# Patient Record
Sex: Female | Born: 1970
Health system: Southern US, Community
[De-identification: ages and names within clinical notes are randomized; demographics above are authoritative.]

## PROBLEM LIST (undated history)

## (undated) DIAGNOSIS — G8929 Other chronic pain: Secondary | ICD-10-CM

## (undated) DIAGNOSIS — F32A Depression, unspecified: Secondary | ICD-10-CM

## (undated) DIAGNOSIS — K5792 Diverticulitis of intestine, part unspecified, without perforation or abscess without bleeding: Secondary | ICD-10-CM

## (undated) DIAGNOSIS — F419 Anxiety disorder, unspecified: Secondary | ICD-10-CM

## (undated) DIAGNOSIS — G4700 Insomnia, unspecified: Secondary | ICD-10-CM

## (undated) DIAGNOSIS — M7918 Myalgia, other site: Secondary | ICD-10-CM

## (undated) DIAGNOSIS — G43909 Migraine, unspecified, not intractable, without status migrainosus: Secondary | ICD-10-CM

## (undated) DIAGNOSIS — F329 Major depressive disorder, single episode, unspecified: Secondary | ICD-10-CM

## (undated) DIAGNOSIS — M549 Dorsalgia, unspecified: Secondary | ICD-10-CM

## (undated) DIAGNOSIS — E785 Hyperlipidemia, unspecified: Secondary | ICD-10-CM

## (undated) HISTORY — DX: Major depressive disorder, single episode, unspecified: F32.9

## (undated) HISTORY — DX: Hyperlipidemia, unspecified: E78.5

## (undated) HISTORY — DX: Myalgia, other site: M79.18

## (undated) HISTORY — PX: CERVICAL SPINE SURGERY: SHX589

## (undated) HISTORY — DX: Migraine, unspecified, not intractable, without status migrainosus: G43.909

## (undated) HISTORY — DX: Dorsalgia, unspecified: M54.9

## (undated) HISTORY — PX: TUBAL LIGATION: SHX77

## (undated) HISTORY — DX: Insomnia, unspecified: G47.00

## (undated) HISTORY — DX: Other chronic pain: G89.29

## (undated) HISTORY — DX: Anxiety disorder, unspecified: F41.9

## (undated) HISTORY — DX: Depression, unspecified: F32.A

---

## 1998-04-28 ENCOUNTER — Emergency Department (HOSPITAL_COMMUNITY): Admission: EM | Admit: 1998-04-28 | Discharge: 1998-04-28 | Payer: Self-pay | Admitting: Emergency Medicine

## 2002-07-17 ENCOUNTER — Encounter: Payer: Self-pay | Admitting: Family Medicine

## 2002-07-17 ENCOUNTER — Ambulatory Visit (HOSPITAL_COMMUNITY): Admission: RE | Admit: 2002-07-17 | Discharge: 2002-07-17 | Payer: Self-pay | Admitting: Family Medicine

## 2003-08-08 ENCOUNTER — Encounter: Payer: Self-pay | Admitting: Family Medicine

## 2003-08-08 ENCOUNTER — Ambulatory Visit (HOSPITAL_COMMUNITY): Admission: RE | Admit: 2003-08-08 | Discharge: 2003-08-08 | Payer: Self-pay | Admitting: Family Medicine

## 2004-02-17 ENCOUNTER — Ambulatory Visit (HOSPITAL_COMMUNITY): Admission: RE | Admit: 2004-02-17 | Discharge: 2004-02-17 | Payer: Self-pay | Admitting: Family Medicine

## 2004-06-14 ENCOUNTER — Ambulatory Visit (HOSPITAL_COMMUNITY): Admission: RE | Admit: 2004-06-14 | Discharge: 2004-06-15 | Payer: Self-pay | Admitting: Neurological Surgery

## 2008-06-13 ENCOUNTER — Ambulatory Visit (HOSPITAL_COMMUNITY): Admission: RE | Admit: 2008-06-13 | Discharge: 2008-06-13 | Payer: Self-pay | Admitting: Family Medicine

## 2008-09-30 ENCOUNTER — Ambulatory Visit: Payer: Self-pay | Admitting: General Practice

## 2010-03-03 ENCOUNTER — Ambulatory Visit (HOSPITAL_COMMUNITY): Admission: RE | Admit: 2010-03-03 | Discharge: 2010-03-03 | Payer: Self-pay | Admitting: Family Medicine

## 2010-03-31 ENCOUNTER — Encounter
Admission: RE | Admit: 2010-03-31 | Discharge: 2010-03-31 | Payer: Self-pay | Admitting: Physical Medicine & Rehabilitation

## 2010-04-02 ENCOUNTER — Ambulatory Visit: Payer: Self-pay | Admitting: Physical Medicine & Rehabilitation

## 2011-01-13 ENCOUNTER — Emergency Department (HOSPITAL_COMMUNITY)
Admission: EM | Admit: 2011-01-13 | Discharge: 2011-01-14 | Disposition: A | Discharge status: Home / Self Care | Payer: Self-pay | Attending: Emergency Medicine | Admitting: Emergency Medicine

## 2011-01-13 ENCOUNTER — Emergency Department (HOSPITAL_COMMUNITY): Payer: Self-pay

## 2011-01-13 DIAGNOSIS — R110 Nausea: Secondary | ICD-10-CM | POA: Insufficient documentation

## 2011-01-13 DIAGNOSIS — W19XXXA Unspecified fall, initial encounter: Secondary | ICD-10-CM | POA: Insufficient documentation

## 2011-01-13 DIAGNOSIS — R51 Headache: Secondary | ICD-10-CM | POA: Insufficient documentation

## 2011-01-13 DIAGNOSIS — M542 Cervicalgia: Secondary | ICD-10-CM | POA: Insufficient documentation

## 2011-01-13 DIAGNOSIS — R55 Syncope and collapse: Secondary | ICD-10-CM | POA: Insufficient documentation

## 2011-01-13 DIAGNOSIS — S0990XA Unspecified injury of head, initial encounter: Secondary | ICD-10-CM | POA: Insufficient documentation

## 2011-01-13 LAB — BASIC METABOLIC PANEL
BUN: 5 mg/dL — ABNORMAL LOW (ref 6–23)
CO2: 22 mEq/L (ref 19–32)
Calcium: 7.8 mg/dL — ABNORMAL LOW (ref 8.4–10.5)
Creatinine, Ser: 0.96 mg/dL (ref 0.4–1.2)
Glucose, Bld: 125 mg/dL — ABNORMAL HIGH (ref 70–99)

## 2011-01-13 LAB — CBC
HCT: 33.5 % — ABNORMAL LOW (ref 36.0–46.0)
MCHC: 35.2 g/dL (ref 30.0–36.0)
MCV: 90.3 fL (ref 78.0–100.0)
Platelets: 273 10*3/uL (ref 150–400)
RDW: 13.2 % (ref 11.5–15.5)

## 2011-01-13 LAB — DIFFERENTIAL
Eosinophils Absolute: 0.1 10*3/uL (ref 0.0–0.7)
Eosinophils Relative: 1 % (ref 0–5)
Lymphocytes Relative: 54 % — ABNORMAL HIGH (ref 12–46)
Lymphs Abs: 4 10*3/uL (ref 0.7–4.0)
Monocytes Absolute: 0.5 10*3/uL (ref 0.1–1.0)

## 2011-01-13 LAB — URINALYSIS, ROUTINE W REFLEX MICROSCOPIC
Bilirubin Urine: NEGATIVE
Hgb urine dipstick: NEGATIVE
Ketones, ur: NEGATIVE mg/dL
Protein, ur: NEGATIVE mg/dL
Urobilinogen, UA: 0.2 mg/dL (ref 0.0–1.0)

## 2011-04-22 NOTE — Op Note (Signed)
NAME:  Barbara Yates, Barbara Yates                        ACCOUNT NO.:  [id number]   MEDICAL RECORD NO.:  [id number]                   PATIENT TYPE:  OIB   LOCATION:  3012                                 FACILITY:  [facility]   PHYSICIAN:  Stefani Dama, M.D.               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   OPERATION PERFORMED:  Anterior cervical decompression, arthrodesis with  structural allograft, Alphatek fixation.   SURGEON:  Stefani Dama, M.D.   FIRST ASSISTANT:  Hilda Lias, M.D.   ANESTHESIA:  General endotracheal.   INDICATIONS FOR PROCEDURE:  The patient is a 40 year old individual who has  had significant symptoms of cervical spondylitic myelopathy with  radiculopathy.  She was found to have a large centrally herniated disk at  C5-C6 level causing cord compression.  After careful consideration of her  options, she was advised regarding surgical decompression and stabilization  of this joint.   DESCRIPTION OF PROCEDURE:  The patient was brought to the operating room and  placed on the table in supine position.  After smooth induction of general  endotracheal anesthesia, she was placed in five pounds of halter traction  and the neck was prepped with DuraPrep and draped in sterile fashion.  A  transverse incision was made in the slightest skin crease in the midportion  of the patient's neck and this was carried down to the platysma.  The plane  between the sternocleidomastoid muscle and the strap muscles was dissected  bluntly until the prevertebral space was reached.  The first identifiable  disk space was noted to be that of C6-7 on a localizing radiograph.  Dissection was  carried cephalad slightly so as to expose C5 and C6.  Self-  retaining Caspar retractor was placed  in the wound.  The longus colli  muscle was stripped back and the disk space was then entered using a 15  blade and a combination of curets and rongeurs was used to evacuate the disk  space of a significant quantity of degenerative disk material.  As the  region of the posterior longitudinal ligament was reached, there was noted  to be several rents with fragments of disk protruding through them.  These  openings were enlarged and the disk was removed from the free epidural space  thus decompressing the ventral spinal canal.  Dissection was carried out to  the left and then off to the right side to decompress these areas each.  The  posterior edges of the osteophytes in the depth of the disk space were then  drilled down with high speed air drill and a 2.3 mm dissecting tool.  Hemostasis was  carefully maintained in the epidural space and around the  nerve roots and also in the soft tissues.  Once the disk was completely  evacuated and the nerve roots were well decompressed, a 7 mm Alphatek graft  was placed into the wound.  This was packed with as much demineralized bone  matrix as could be fitted into the center of the graft. The graft was  countersunk slightly and ventral osteophytes were then trimmed and an 18 mm  standard size Alphatek plate was affixed with four locking 4 x 14 mm screws.  Final localizing radiograph confirmed good position of the plate and screws.  Care was then taken to  make sure that hemostasis was adequately achieved in the prevertebral space  and then the platysma was closed with 3-0 Vicryl in interrupted fashion.  3-  0 Vicryl was used subcuticularly and Dermabond was placed on the skin. The  patient tolerated the procedure well and was returned to the recovery room  in stable condition.                                               Stefani Dama, M.D.     Merla Riches  D:  06/14/2004  T:  06/14/2004  Job:  [id number]

## 2013-04-11 DIAGNOSIS — Z0289 Encounter for other administrative examinations: Secondary | ICD-10-CM

## 2013-05-06 ENCOUNTER — Other Ambulatory Visit: Payer: Self-pay | Admitting: Family Medicine

## 2013-05-10 NOTE — Telephone Encounter (Signed)
Ok times one, will need ov before further 

## 2013-05-14 ENCOUNTER — Telehealth: Payer: Self-pay | Admitting: Nurse Practitioner

## 2013-05-14 MED ORDER — HYDROCODONE-ACETAMINOPHEN 5-325 MG PO TABS
1.0000 | ORAL_TABLET | Freq: Four times a day (QID) | ORAL | Status: DC | PRN
Start: 2013-05-14 — End: 2013-06-10

## 2013-05-14 NOTE — Telephone Encounter (Signed)
Patient needs a refill of vicodin to [facility] [place]

## 2013-05-14 NOTE — Telephone Encounter (Signed)
Ok times one. Needs f u visit with carolyn (she saw 3 and a half ago and rec three mo f u

## 2013-05-14 NOTE — Telephone Encounter (Signed)
Script signed and faxed to pharmacy. Left message on voicemail notifying patient RX faxed in and schedule f/u OV.

## 2013-05-15 ENCOUNTER — Encounter: Payer: Self-pay | Admitting: *Deleted

## 2013-05-24 ENCOUNTER — Encounter: Payer: Self-pay | Admitting: Nurse Practitioner

## 2013-05-24 ENCOUNTER — Ambulatory Visit (INDEPENDENT_AMBULATORY_CARE_PROVIDER_SITE_OTHER): Payer: Federal, State, Local not specified - PPO | Admitting: Nurse Practitioner

## 2013-05-24 VITALS — BP 126/100 | Temp 98.3°F | Wt 235.0 lb

## 2013-05-24 DIAGNOSIS — IMO0001 Reserved for inherently not codable concepts without codable children: Secondary | ICD-10-CM

## 2013-05-24 DIAGNOSIS — M797 Fibromyalgia: Secondary | ICD-10-CM

## 2013-05-24 DIAGNOSIS — J323 Chronic sphenoidal sinusitis: Secondary | ICD-10-CM

## 2013-05-24 MED ORDER — METHOCARBAMOL 750 MG PO TABS: 750.0000 mg | ORAL_TABLET | Freq: Three times a day (TID) | ORAL | Status: DC

## 2013-05-24 MED ORDER — FLUCONAZOLE 150 MG PO TABS: ORAL_TABLET | ORAL | Status: DC

## 2013-05-24 MED ORDER — AMOXICILLIN 875 MG PO TABS: 875.0000 mg | ORAL_TABLET | Freq: Two times a day (BID) | ORAL | Status: DC

## 2013-05-24 MED ORDER — PHENTERMINE HCL 37.5 MG PO TABS: 37.5000 mg | ORAL_TABLET | Freq: Every day | ORAL | Status: DC

## 2013-05-24 MED ORDER — AZITHROMYCIN 250 MG PO TABS: ORAL_TABLET | ORAL | Status: DC

## 2013-05-24 NOTE — Patient Instructions (Signed)
[facility] TENS unit for  muscles

## 2013-05-27 ENCOUNTER — Other Ambulatory Visit: Payer: Self-pay | Admitting: Family Medicine

## 2013-05-27 ENCOUNTER — Encounter: Payer: Self-pay | Admitting: Nurse Practitioner

## 2013-05-27 DIAGNOSIS — M797 Fibromyalgia: Secondary | ICD-10-CM | POA: Insufficient documentation

## 2013-05-27 NOTE — Assessment & Plan Note (Signed)
Discussed options at length. Given prescription for TENS unit. If she cannot afford this, to look into one OTC. Switch to Robaxin and naproxen. Discussed importance of stress reduction. Encourage regular exercise program including stretching.

## 2013-05-27 NOTE — Progress Notes (Signed)
Subjective:  Presents for routine followup. Still sees her therapist about every 2 weeks. Celexa seems to be controlling her symptoms overall. no suicidal or homicidal thoughts ideation. Complaints of mid to low back pain 3-4 days of the week. Slight relief with anti-inflammatories as and Flexeril. Does not seem to be working as well. Mainly working at a desk job. Limited exercise. Also complaints of ethmoid sinus area headache for the past week. Yellow-green mucus. Increase cough today. Sore throat. Ear pain. No wheezing.  Objective:   BP 126/100  Temp(Src) 98.3 F (36.8 C) (Oral)  Wt 235 lb (106.595 kg) NAD. Alert, oriented. TMs retracted, no erythema. Pharynx injected with green PND noted. Neck supple with mild soft nontender adenopathy. Lungs clear. Heart regular rate rhythm. Very tight tender muscles noted along the upper back and neck area into the mid to lower thoracic area. Also tenderness in the SI area. Generalized muscle tenderness with palpation e upper and lower body both sides.  Assessment:Fibromyalgia  Sphenoid sinusitis  Plan: Discussed options at length. Given prescription for TENS unit. If she cannot afford this, to look into one OTC. Switch to Robaxin and naproxen. Discussed importance of stress reduction. Encourage regular exercise program including stretching. Amoxicillin x10 days. Diflucan as directed if needed. Given one more refill on phentermine and then she must DC. Callback in 7-10 days if no improvement in sinus symptoms, sooner if worse. Recheck if muscle pain persists.

## 2013-05-27 NOTE — Telephone Encounter (Signed)
Last follow up visit was 05/24/13 with Eber Jones.

## 2013-05-28 NOTE — Telephone Encounter (Signed)
Ok five monthly ref

## 2013-05-28 NOTE — Telephone Encounter (Signed)
RX called into pharmacy

## 2013-06-10 ENCOUNTER — Other Ambulatory Visit: Payer: Self-pay | Admitting: Family Medicine

## 2013-06-10 NOTE — Telephone Encounter (Signed)
Last seen 05/24/13- was told she would need office visit for further refills of Adipex

## 2013-06-11 NOTE — Telephone Encounter (Signed)
Barbara Yates told her at last visit must stop after last phentermine prescription. Tell pt no. Refill hyudrocod times one

## 2013-06-12 ENCOUNTER — Telehealth: Payer: Self-pay | Admitting: Family Medicine

## 2013-06-12 MED ORDER — ZOLPIDEM TARTRATE 10 MG PO TABS: 10.0000 mg | ORAL_TABLET | ORAL | Status: DC | PRN

## 2013-06-12 MED ORDER — HYDROCODONE-ACETAMINOPHEN 5-325 MG PO TABS: 1.0000 | ORAL_TABLET | Freq: Four times a day (QID) | ORAL | Status: DC | PRN

## 2013-06-12 NOTE — Telephone Encounter (Signed)
Left message to return call Hydrocodone and Ambien were approved and faxed to the pharmacy. The Adipex was refused patient has taken it for the maximum amount of time and must stop for 6 mths to a year. See last progress note in June.

## 2013-06-12 NOTE — Telephone Encounter (Signed)
Patient would like the [facility] [address] [facility] [place]: Adipex-P 37.5mg , Amibien, Vicodin.  States she called the pharmacy to request and they states everything was rejected.  She would like to know why/if they were rejected.  Please call patient with questions.  Thanks

## 2013-06-12 NOTE — Telephone Encounter (Signed)
Refill on Adipex was [facility] for one time refill ONLY per Dr. Brett Canales. Patient was notified that we will no longer refill this medication anymore for her per Dr. Brett Canales through phone or fax. Patient verbalized understanding.

## 2013-06-24 ENCOUNTER — Telehealth: Payer: Self-pay | Admitting: Family Medicine

## 2013-06-24 ENCOUNTER — Other Ambulatory Visit: Payer: Self-pay | Admitting: Family Medicine

## 2013-06-24 NOTE — Telephone Encounter (Signed)
Prior Auth obtained through [facility] for pt's Zolpidem Tartrate 10mg  for nightly use, expires 06/24/14, [facility] [phone]

## 2013-07-11 ENCOUNTER — Other Ambulatory Visit: Payer: Self-pay | Admitting: Nurse Practitioner

## 2013-07-30 ENCOUNTER — Other Ambulatory Visit: Payer: Self-pay | Admitting: Nurse Practitioner

## 2013-08-02 ENCOUNTER — Telehealth: Payer: Self-pay | Admitting: Family Medicine

## 2013-08-02 NOTE — Telephone Encounter (Signed)
Pt calling to say that her work has changed it's policy to add job duties that will be detrimental to her health/back condition. Pt wants to know if she can get a work not stating she has to have limited duties/work restrictions? If so please type it up and have ready for pt to pick up.

## 2013-08-02 NOTE — Telephone Encounter (Signed)
Patient states that she is going to fax over a detailed job description for you to go by. She will fax it between now and Tuesday.

## 2013-08-02 NOTE — Telephone Encounter (Signed)
Nurses: need more specific information for note.  Ex. How much is she limited to lift? How much walking, climbing, steps squatting etc.is required by new position? Best to be detailed

## 2013-08-08 ENCOUNTER — Other Ambulatory Visit: Payer: Self-pay | Admitting: Family Medicine

## 2013-08-09 NOTE — Telephone Encounter (Signed)
Fax came in attached to the chart and sent back, closing encounter

## 2013-08-12 ENCOUNTER — Encounter: Payer: Self-pay | Admitting: Nurse Practitioner

## 2013-08-14 ENCOUNTER — Other Ambulatory Visit: Payer: Self-pay | Admitting: Family Medicine

## 2013-08-15 NOTE — Telephone Encounter (Signed)
1 refill, if ongoing need she will need a office visit. This medication becoming scheduled II drug

## 2013-09-03 ENCOUNTER — Telehealth: Payer: Self-pay | Admitting: Family Medicine

## 2013-09-03 NOTE — Telephone Encounter (Signed)
Nurses plz forward this to appropriate party

## 2013-09-03 NOTE — Telephone Encounter (Signed)
Patient would like to speak to someone about getting the forms amended that she had filled out the other day regarding her job duties. She says her employer is giving her a hard time.

## 2013-09-06 ENCOUNTER — Encounter: Payer: Self-pay | Admitting: Nurse Practitioner

## 2013-09-09 ENCOUNTER — Ambulatory Visit: Payer: Federal, State, Local not specified - PPO | Admitting: Nurse Practitioner

## 2013-09-18 ENCOUNTER — Ambulatory Visit: Payer: Federal, State, Local not specified - PPO | Admitting: Nurse Practitioner

## 2013-09-30 ENCOUNTER — Encounter: Payer: Self-pay | Admitting: Nurse Practitioner

## 2013-09-30 ENCOUNTER — Ambulatory Visit (INDEPENDENT_AMBULATORY_CARE_PROVIDER_SITE_OTHER): Payer: Federal, State, Local not specified - PPO | Admitting: Nurse Practitioner

## 2013-09-30 VITALS — BP 112/82 | Ht 70.0 in | Wt 249.6 lb

## 2013-09-30 DIAGNOSIS — M797 Fibromyalgia: Secondary | ICD-10-CM

## 2013-09-30 DIAGNOSIS — IMO0001 Reserved for inherently not codable concepts without codable children: Secondary | ICD-10-CM

## 2013-09-30 DIAGNOSIS — F419 Anxiety disorder, unspecified: Secondary | ICD-10-CM

## 2013-09-30 DIAGNOSIS — F411 Generalized anxiety disorder: Secondary | ICD-10-CM

## 2013-09-30 MED ORDER — METHOCARBAMOL 750 MG PO TABS: ORAL_TABLET | ORAL | Status: DC

## 2013-09-30 MED ORDER — HYDROCODONE-ACETAMINOPHEN 5-325 MG PO TABS: 1.0000 | ORAL_TABLET | Freq: Every day | ORAL | Status: DC | PRN

## 2013-09-30 MED ORDER — GABAPENTIN 300 MG PO CAPS: ORAL_CAPSULE | ORAL | Status: DC

## 2013-09-30 NOTE — Patient Instructions (Addendum)
Florencia Reasons at [facility]: Marylene Land at [facility] in [place] or heat to neck TENS unit at [email or web address]

## 2013-10-02 ENCOUNTER — Encounter: Payer: Self-pay | Admitting: Nurse Practitioner

## 2013-10-02 DIAGNOSIS — F419 Anxiety disorder, unspecified: Secondary | ICD-10-CM | POA: Insufficient documentation

## 2013-10-02 NOTE — Assessment & Plan Note (Signed)
Continue Celexa and Xanax as directed. Discussed importance of stress reduction. Recheck in 3 months.

## 2013-10-02 NOTE — Progress Notes (Signed)
Subjective:  Presents with complaints of a flareup of her anxiety. Has a multitude of factors that have increased her stress and anxiety. Has lost her job. Because of this lost her mental health counseling that she was receiving through work. Recently lost her grandfather. Broke up with her boyfriend that she's been with for several years, he got married recently on her 87th birthday. Her son was arrested and has charges pending against him. This has created a financial burden since patient has had to hire an attorney. Denies suicidal thoughts or ideation. Has tried to increase her activity, trying yoga. Plans to restart counseling with a new counselor. Has had to increase her Xanax 0.5 mg to 2 per day on her worse days for the past 2 months. Significant flareup of her fibromyalgia with significant muscle pain achiness and fatigue. Slight relief with naproxen and Robaxin. Averaging one Vicodin per day at most. Refuses flu vaccine.  Objective:   BP 112/82  Ht 5\' 10"  (1.778 m)  Wt 249 lb 9.6 oz (113.218 kg)  BMI 35.81 kg/m2 NAD. Alert, oriented. Lungs clear. Heart regular rate rhythm. Generalized tenderness and pressure points around the body. Mildly anxious affect. Crying at times during office visit.  Assessment:Fibromyalgia  Anxiety  Plan: Meds ordered this encounter  Medications  . DISCONTD: HYDROcodone-acetaminophen (NORCO/VICODIN) 5-325 MG per tablet    Sig: Take 1 tablet by mouth daily as needed for pain.    Dispense:  30 tablet    Refill:  0    Order Specific Question:  Supervising Provider    Answer:  Merlyn Albert [[id number]]  . DISCONTD: HYDROcodone-acetaminophen (NORCO/VICODIN) 5-325 MG per tablet    Sig: Take 1 tablet by mouth daily as needed for pain.    Dispense:  30 tablet    Refill:  0    May fill after 10/31/11    Order Specific Question:  Supervising Provider    Answer:  Merlyn Albert [[id number]]  . HYDROcodone-acetaminophen (NORCO/VICODIN) 5-325 MG per tablet    Sig:  Take 1 tablet by mouth daily as needed for pain.    Dispense:  30 tablet    Refill:  0    May fill after 11/30/11    Order Specific Question:  Supervising Provider    Answer:  Merlyn Albert [[id number]]  . methocarbamol (ROBAXIN) 750 MG tablet    Sig: TAKE 1 TABLET BY MOUTH 3 TIMES A DAY AS NEEDED FOR MUSCLE SPASMS    Dispense:  90 tablet    Refill:  2    Order Specific Question:  Supervising Provider    Answer:  Merlyn Albert [[id number]]  . gabapentin (NEURONTIN) 300 MG capsule    Sig: Take one cap this evening then one po BID    Dispense:  60 capsule    Refill:  2    Order Specific Question:  Supervising Provider    Answer:  Merlyn Albert [[id number]]   Given 3 separate monthly prescriptions for her Vicodin. Add Neurontin to her regimen to see if this will help with her fibromyalgia. Discussed importance of exercise healthy diet and stress reduction. Followup in 3 months, call back sooner if any problems.

## 2013-10-02 NOTE — Assessment & Plan Note (Signed)
Given 3 separate monthly prescriptions for her Vicodin. Add Neurontin to her regimen to see if this will help with her fibromyalgia. Discussed importance of exercise healthy diet and stress reduction. Followup in 3 months, call back sooner if any problems.

## 2013-11-08 ENCOUNTER — Other Ambulatory Visit: Payer: Self-pay | Admitting: Family Medicine

## 2013-11-08 NOTE — Telephone Encounter (Signed)
Ok plus 3 ref 

## 2013-12-01 ENCOUNTER — Other Ambulatory Visit: Payer: Self-pay | Admitting: Family Medicine

## 2013-12-02 NOTE — Telephone Encounter (Signed)
Last office visit 09-30-13

## 2013-12-04 ENCOUNTER — Other Ambulatory Visit: Payer: Self-pay | Admitting: *Deleted

## 2013-12-04 NOTE — Telephone Encounter (Signed)
Ok all plus two refills

## 2013-12-09 ENCOUNTER — Other Ambulatory Visit: Payer: Self-pay | Admitting: Nurse Practitioner

## 2013-12-10 ENCOUNTER — Other Ambulatory Visit: Payer: Self-pay | Admitting: Family Medicine

## 2013-12-11 ENCOUNTER — Other Ambulatory Visit: Payer: Self-pay | Admitting: *Deleted

## 2013-12-11 NOTE — Telephone Encounter (Signed)
Ok times one 

## 2013-12-11 NOTE — Telephone Encounter (Signed)
Last seen 09/30/13

## 2013-12-12 ENCOUNTER — Other Ambulatory Visit: Payer: Self-pay | Admitting: Family Medicine

## 2013-12-14 ENCOUNTER — Other Ambulatory Visit: Payer: Self-pay | Admitting: Family Medicine

## 2013-12-16 NOTE — Telephone Encounter (Signed)
Ok times 3 

## 2013-12-24 ENCOUNTER — Telehealth: Payer: Self-pay | Admitting: Family Medicine

## 2013-12-24 MED ORDER — ALPRAZOLAM 0.5 MG PO TABS: ORAL_TABLET | ORAL | Status: DC

## 2013-12-24 NOTE — Telephone Encounter (Signed)
Rx re faxed to pharmacy

## 2013-12-24 NOTE — Telephone Encounter (Signed)
[facility], she wants to know if we can  Call it in or try to fax it yet again?   [facility]   ALPRAZolam (XANAX) 0.5 MG tablet

## 2014-01-06 ENCOUNTER — Encounter: Payer: Federal, State, Local not specified - PPO | Admitting: Nurse Practitioner

## 2014-01-16 ENCOUNTER — Ambulatory Visit (INDEPENDENT_AMBULATORY_CARE_PROVIDER_SITE_OTHER): Payer: Federal, State, Local not specified - PPO | Admitting: Nurse Practitioner

## 2014-01-16 ENCOUNTER — Encounter: Payer: Self-pay | Admitting: Nurse Practitioner

## 2014-01-16 VITALS — BP 132/70 | Ht 70.25 in | Wt 248.0 lb

## 2014-01-16 DIAGNOSIS — S46919A Strain of unspecified muscle, fascia and tendon at shoulder and upper arm level, unspecified arm, initial encounter: Secondary | ICD-10-CM

## 2014-01-16 DIAGNOSIS — L300 Nummular dermatitis: Secondary | ICD-10-CM

## 2014-01-16 DIAGNOSIS — L259 Unspecified contact dermatitis, unspecified cause: Secondary | ICD-10-CM

## 2014-01-16 DIAGNOSIS — R0789 Other chest pain: Secondary | ICD-10-CM

## 2014-01-16 MED ORDER — HYDROCODONE-ACETAMINOPHEN 5-325 MG PO TABS: 1.0000 | ORAL_TABLET | Freq: Every day | ORAL | Status: DC | PRN

## 2014-01-16 MED ORDER — CYCLOBENZAPRINE HCL 10 MG PO TABS: ORAL_TABLET | ORAL | Status: DC

## 2014-01-16 MED ORDER — CLOBETASOL PROPIONATE 0.05 % EX CREA: 1.0000 "application " | TOPICAL_CREAM | Freq: Two times a day (BID) | CUTANEOUS | Status: DC

## 2014-01-20 ENCOUNTER — Encounter: Payer: Self-pay | Admitting: Nurse Practitioner

## 2014-01-20 NOTE — Progress Notes (Signed)
Subjective:  Presents for c/o pain near the right scapula for the past 2 months. No specific history of injury. No abd pain, nausea or vomiting.Worse with vacuuming, texting or styling hair. Localized. Also a rash on the left on in both lower legs off-and-on for the past year. Minimally pruritic. Nontender. No known allergens. No known triggers.  Objective:   BP 132/70  Ht 5' 10.25" (1.784 m)  Wt 248 lb (112.492 kg)  BMI 35.35 kg/m2 NAD. Alert, oriented. Lungs clear. Heart regular rate rhythm. Obvious tight tender muscles noted between the right scapula and spine. Good ROM of the neck and right shoulder. Muscle strength 5+ bilateral upper extremities. Multiple discrete minimally raised pink coin shaped dry confluent lesions noted mainly on the lower legs with a few lesions on the left arm.  Assessment:Muscle strain of scapular region  Nummular eczema  Plan:  Meds ordered this encounter  Medications  . Ondansetron HCl (ZOFRAN PO)    Sig: Take by mouth.  . cyclobenzaprine (FLEXERIL) 10 MG tablet    Sig: TAKE 1 TABLET BY MOUTH AT BEDTIME AS NEEDED SPASMS    Dispense:  30 tablet    Refill:  2    Order Specific Question:  Supervising Provider    Answer:  Mikey Kirschner [[id number]]  . clobetasol cream (TEMOVATE) 0.05 %    Sig: Apply 1 application topically 2 (two) times daily.    Dispense:  30 g    Refill:  0    Order Specific Question:  Supervising Provider    Answer:  Mikey Kirschner [[id number]]  . DISCONTD: HYDROcodone-acetaminophen (NORCO/VICODIN) 5-325 MG per tablet    Sig: Take 1 tablet by mouth daily as needed.    Dispense:  30 tablet    Refill:  0    Order Specific Question:  Supervising Provider    Answer:  Mikey Kirschner [[id number]]  . DISCONTD: HYDROcodone-acetaminophen (NORCO/VICODIN) 5-325 MG per tablet    Sig: Take 1 tablet by mouth daily as needed.    Dispense:  30 tablet    Refill:  0    May fill 30 days after 01/16/14    Order Specific Question:  Supervising Provider   Answer:  Mikey Kirschner [[id number]]  . HYDROcodone-acetaminophen (NORCO/VICODIN) 5-325 MG per tablet    Sig: Take 1 tablet by mouth daily as needed.    Dispense:  30 tablet    Refill:  0    May fill 60 days after 01/16/14    Order Specific Question:  Supervising Provider    Answer:  Mikey Kirschner [[id number]]   reviewed proper skin care. Ice/heat applications to the back area. Stretching exercises. Massage therapy. TENS unit. Call back if no improvement in her symptoms over the next 2-3 weeks.

## 2014-03-27 ENCOUNTER — Other Ambulatory Visit: Payer: Self-pay | Admitting: Family Medicine

## 2014-03-27 NOTE — Telephone Encounter (Signed)
Ok plus three monthly ref if time

## 2014-04-01 ENCOUNTER — Other Ambulatory Visit: Payer: Self-pay | Admitting: Nurse Practitioner

## 2014-07-01 ENCOUNTER — Telehealth: Payer: Self-pay | Admitting: Family Medicine

## 2014-07-01 NOTE — Telephone Encounter (Signed)
As of last fall, the new federal laws do not allow for narcotic rx if not seen in the past three mo, in feb carolyn advised may. We could lose our ability to rx controlled substances if we do not follow these new rules. We will be happy to see when pt can come in. Have carolyn see this note (of note, in past when i did not rx phentermine under similar circumstances pt became angry and threatened to leave practice,. Can call tom

## 2014-07-01 NOTE — Telephone Encounter (Signed)
Last seen 01/16/14

## 2014-07-01 NOTE — Telephone Encounter (Signed)
Patient would like Rx for hydrocodone. She said she has gone a month without this and is suffering. She is in a probationary period with her work so she cannot come in for an appointment right now. She was last prescribed this on 01/16/14.

## 2014-07-02 ENCOUNTER — Other Ambulatory Visit: Payer: Self-pay | Admitting: Family Medicine

## 2014-07-04 ENCOUNTER — Other Ambulatory Visit: Payer: Self-pay | Admitting: *Deleted

## 2014-07-04 ENCOUNTER — Telehealth: Payer: Self-pay | Admitting: *Deleted

## 2014-07-04 MED ORDER — ALPRAZOLAM 0.5 MG PO TABS: 0.5000 mg | ORAL_TABLET | Freq: Every day | ORAL | Status: DC | PRN

## 2014-07-04 NOTE — Telephone Encounter (Signed)
May ref xanax times one

## 2014-07-04 NOTE — Telephone Encounter (Signed)
Pt states she has been requesting refill on xanax for the past two week. Pharm states we have not responded back. i do not see where we have received request can she have refill. Last seen feb 2015. If so pt would like med called into pharm. Phone number is [phone]. Pt does not need a call back unless med is denied.

## 2014-07-04 NOTE — Telephone Encounter (Signed)
Discussed with patient. Pt will schedule office visit.

## 2014-07-04 NOTE — Telephone Encounter (Signed)
Med called into pharm. Pt aware

## 2014-07-07 NOTE — Telephone Encounter (Signed)
Reviewed note

## 2014-07-08 ENCOUNTER — Other Ambulatory Visit: Payer: Self-pay | Admitting: Family Medicine

## 2014-07-17 ENCOUNTER — Other Ambulatory Visit: Payer: Self-pay | Admitting: Nurse Practitioner

## 2014-07-17 ENCOUNTER — Telehealth: Payer: Self-pay | Admitting: Family Medicine

## 2014-07-17 MED ORDER — ONDANSETRON HCL 8 MG PO TABS: 8.0000 mg | ORAL_TABLET | Freq: Three times a day (TID) | ORAL | Status: DC | PRN

## 2014-07-17 NOTE — Telephone Encounter (Signed)
Done. If she wants something specifically for motion sickness, let me know.

## 2014-07-17 NOTE — Telephone Encounter (Signed)
Patient needs Rx for Ondansetron HCl (ZOFRAN PO) because she is going on a cruise.   [facility]

## 2014-07-17 NOTE — Telephone Encounter (Signed)
[facility] 

## 2014-07-17 NOTE — Telephone Encounter (Signed)
No pt just wants zofran. Has tried motion sickness med in the past and does not want.

## 2014-08-07 ENCOUNTER — Telehealth: Payer: Self-pay | Admitting: Nurse Practitioner

## 2014-08-07 ENCOUNTER — Other Ambulatory Visit: Payer: Self-pay | Admitting: Family Medicine

## 2014-08-07 NOTE — Telephone Encounter (Signed)
ALPRAZolam (XANAX) 0.5 MG tablet  Pt needs refill on this med as well as the  Naproxen that was sent via the pharmacy  Last seen 01/16/14    Last filled 07/04/14

## 2014-08-07 NOTE — Telephone Encounter (Signed)
[facility] [place] [facility] [address]

## 2014-08-08 ENCOUNTER — Other Ambulatory Visit: Payer: Self-pay | Admitting: Nurse Practitioner

## 2014-08-08 MED ORDER — ALPRAZOLAM 0.5 MG PO TABS: 0.5000 mg | ORAL_TABLET | Freq: Every day | ORAL | Status: DC | PRN

## 2014-08-08 NOTE — Telephone Encounter (Signed)
Rx's sent to pharmacy. Patient notified. 

## 2014-08-08 NOTE — Telephone Encounter (Signed)
Naproxen was sent in. Will print Rx for Xanax.

## 2014-08-21 ENCOUNTER — Encounter: Payer: Self-pay | Admitting: Nurse Practitioner

## 2014-08-21 ENCOUNTER — Encounter: Payer: Federal, State, Local not specified - PPO | Admitting: Nurse Practitioner

## 2014-08-21 ENCOUNTER — Telehealth: Payer: Self-pay | Admitting: Family Medicine

## 2014-08-21 ENCOUNTER — Ambulatory Visit (INDEPENDENT_AMBULATORY_CARE_PROVIDER_SITE_OTHER): Payer: Federal, State, Local not specified - PPO | Admitting: Nurse Practitioner

## 2014-08-21 VITALS — BP 128/80 | Ht 69.0 in | Wt 255.0 lb

## 2014-08-21 DIAGNOSIS — Z113 Encounter for screening for infections with a predominantly sexual mode of transmission: Secondary | ICD-10-CM

## 2014-08-21 DIAGNOSIS — N760 Acute vaginitis: Secondary | ICD-10-CM

## 2014-08-21 DIAGNOSIS — Z1151 Encounter for screening for human papillomavirus (HPV): Secondary | ICD-10-CM

## 2014-08-21 DIAGNOSIS — Z124 Encounter for screening for malignant neoplasm of cervix: Secondary | ICD-10-CM

## 2014-08-21 DIAGNOSIS — Z01419 Encounter for gynecological examination (general) (routine) without abnormal findings: Secondary | ICD-10-CM

## 2014-08-21 DIAGNOSIS — A499 Bacterial infection, unspecified: Secondary | ICD-10-CM

## 2014-08-21 DIAGNOSIS — Z0000 Encounter for general adult medical examination without abnormal findings: Secondary | ICD-10-CM

## 2014-08-21 DIAGNOSIS — B9689 Other specified bacterial agents as the cause of diseases classified elsewhere: Secondary | ICD-10-CM

## 2014-08-21 LAB — POCT WET PREP WITH KOH
Clue Cells Wet Prep HPF POC: POSITIVE
EPITHELIAL WET PREP PER HPF POC: POSITIVE
KOH Prep POC: NEGATIVE
RBC Wet Prep HPF POC: NEGATIVE
Trichomonas, UA: NEGATIVE
Yeast Wet Prep HPF POC: POSITIVE
pH: 5.5

## 2014-08-21 MED ORDER — HYDROCODONE-ACETAMINOPHEN 5-325 MG PO TABS: 1.0000 | ORAL_TABLET | Freq: Every day | ORAL | Status: DC | PRN

## 2014-08-21 MED ORDER — HYDROCODONE-ACETAMINOPHEN 5-325 MG PO TABS: 1.0000 | ORAL_TABLET | ORAL | Status: DC | PRN

## 2014-08-21 MED ORDER — ALPRAZOLAM 1 MG PO TABS: 1.0000 mg | ORAL_TABLET | Freq: Every evening | ORAL | Status: DC | PRN

## 2014-08-21 MED ORDER — METRONIDAZOLE 500 MG PO TABS: 500.0000 mg | ORAL_TABLET | Freq: Two times a day (BID) | ORAL | Status: DC

## 2014-08-21 MED ORDER — ONDANSETRON 4 MG PO TBDP: 4.0000 mg | ORAL_TABLET | Freq: Three times a day (TID) | ORAL | Status: DC | PRN

## 2014-08-21 NOTE — Telephone Encounter (Signed)
clobetasol cream (TEMOVATE) 0.05 %  This is the cream that she needed today when you couldn't See her file

## 2014-08-22 ENCOUNTER — Other Ambulatory Visit: Payer: Self-pay | Admitting: Nurse Practitioner

## 2014-08-22 MED ORDER — BETAMETHASONE DIPROPIONATE 0.05 % EX CREA: TOPICAL_CREAM | Freq: Two times a day (BID) | CUTANEOUS | Status: DC

## 2014-08-22 NOTE — Telephone Encounter (Signed)
Patient notified

## 2014-08-22 NOTE — Telephone Encounter (Signed)
[facility] 08/22/14

## 2014-08-22 NOTE — Telephone Encounter (Signed)
Clobetasol just went up very high on cost even though generic. I will send in another cream that is the same potency.

## 2014-08-25 ENCOUNTER — Encounter: Payer: Self-pay | Admitting: *Deleted

## 2014-08-25 ENCOUNTER — Encounter: Payer: Self-pay | Admitting: Nurse Practitioner

## 2014-08-25 LAB — PAP IG, CT-NG NAA, HPV HIGH-RISK
CHLAMYDIA PROBE AMP: NEGATIVE
GC PROBE AMP: NEGATIVE
HPV DNA High Risk: NOT DETECTED

## 2014-08-25 LAB — POCT WET PREP WITH KOH
Clue Cells Wet Prep HPF POC: POSITIVE
Epithelial Wet Prep HPF POC: POSITIVE
KOH PREP POC: NEGATIVE
RBC Wet Prep HPF POC: NEGATIVE
TRICHOMONAS UA: NEGATIVE
YEAST WET PREP PER HPF POC: POSITIVE
pH: 5.5

## 2014-08-25 NOTE — Progress Notes (Signed)
   Subjective:    Patient ID: Barbara Yates, female    DOB: 01/30/1971, 43 y.o.   MRN: [id number]  HPI presents for her wellness physical. Regular vision and dental exams. Regular menses, normal flow. Has had a tubal ligation for birth control. Has been with a new sexual partner for the past several months. Slight vaginal discharge around the time of her cycle, rare faint odor. Minimal color. No pelvic pain. No fever. Receives her flu vaccine at work.    Review of Systems  Constitutional: Negative for fever, activity change, appetite change and fatigue.  HENT: Negative for dental problem, ear pain, sinus pressure and sore throat.   Respiratory: Negative for cough, chest tightness, shortness of breath and wheezing.   Cardiovascular: Negative for chest pain.  Gastrointestinal: Negative for nausea, vomiting, abdominal pain, diarrhea, constipation and abdominal distention.  Genitourinary: Positive for vaginal discharge. Negative for dysuria, urgency, frequency, enuresis, difficulty urinating, genital sores, menstrual problem and pelvic pain.       Objective:   Physical Exam  Vitals reviewed. Constitutional: She is oriented to person, place, and time. She appears well-developed. No distress.  HENT:  Right Ear: External ear normal.  Left Ear: External ear normal.  Mouth/Throat: Oropharynx is clear and moist.  Neck: Normal range of motion. Neck supple. No tracheal deviation present. No thyromegaly present.  Cardiovascular: Normal rate, regular rhythm and normal heart sounds.  Exam reveals no gallop.   No murmur heard. Pulmonary/Chest: Effort normal and breath sounds normal.  Abdominal: Soft. She exhibits no distension. There is no tenderness.  Genitourinary: Vagina normal and uterus normal. No vaginal discharge found.  External GU normal. Vagina small amount of slightly yellowish mucoid discharge noted. No CMT. Bimanual exam no tenderness or obvious masses, exam limited due to abdominal  girth.  Musculoskeletal: She exhibits no edema.  Lymphadenopathy:    She has no cervical adenopathy.  Neurological: She is alert and oriented to person, place, and time.  Skin: Skin is warm and dry. No rash noted.  Psychiatric: She has a normal mood and affect. Her behavior is normal.   wet prep: Clue cells, some yeast occasional bacteria rare WBC pH 5.5 Breast exam: No masses, axilla no adenopathy.       Assessment & Plan:  Well woman exam  Screening for cervical cancer  Screen for STD (sexually transmitted disease)  Screening for HPV (human papillomavirus)  Bacterial vaginosis - Plan: POCT Wet Prep with KOH  Meds ordered this encounter  Medications  . DISCONTD: HYDROcodone-acetaminophen (NORCO/VICODIN) 5-325 MG per tablet    Sig: Take 1 tablet by mouth daily as needed.    Dispense:  30 tablet    Refill:  0    May fill 60 days after 08/21/14    Order Specific Question:  Supervising Provider    Answer:  Mikey Kirschner [[id number]]  . HYDROcodone-acetaminophen (NORCO/VICODIN) 5-325 MG per tablet    Sig: Take 1 tablet by mouth daily as needed.    Dispense:  30 tablet    Refill:  0    May fill 30 days after 08/21/14    Order Specific Question:  Supervising Provider    Answer:  Mikey Kirschner [[id number]]   Metronidazole 500 mg 1 by mouth twice a day x7 days. Reviewed safe sex issues. Encourage daily vitamin D and calcium supplementation, healthy diet, regular activity and weight loss. Return in about 3 months (around 11/20/2014).

## 2014-08-26 ENCOUNTER — Encounter: Payer: Self-pay | Admitting: Nurse Practitioner

## 2014-10-02 ENCOUNTER — Other Ambulatory Visit: Payer: Self-pay | Admitting: Nurse Practitioner

## 2014-10-04 ENCOUNTER — Encounter: Payer: Self-pay | Admitting: Nurse Practitioner

## 2014-10-14 ENCOUNTER — Other Ambulatory Visit: Payer: Self-pay | Admitting: Nurse Practitioner

## 2014-10-31 ENCOUNTER — Other Ambulatory Visit: Payer: Self-pay | Admitting: Family Medicine

## 2014-11-03 NOTE — Telephone Encounter (Signed)
Ok plus five ref 

## 2014-12-07 ENCOUNTER — Other Ambulatory Visit: Payer: Self-pay | Admitting: Nurse Practitioner

## 2014-12-08 ENCOUNTER — Other Ambulatory Visit: Payer: Self-pay

## 2014-12-25 NOTE — Progress Notes (Signed)
This encounter was created in error - please disregard.

## 2015-02-19 ENCOUNTER — Telehealth: Payer: Self-pay | Admitting: Family Medicine

## 2015-02-19 NOTE — Telephone Encounter (Signed)
Pt'a zolpidem (AMBIEN) 10 MG tablet prior auth is APPROVED 12/21/14-02/19/16 per Barbette Hair w/[employer], [facility]ed approval [facility]

## 2015-03-04 ENCOUNTER — Ambulatory Visit (INDEPENDENT_AMBULATORY_CARE_PROVIDER_SITE_OTHER): Payer: Federal, State, Local not specified - PPO | Admitting: Nurse Practitioner

## 2015-03-04 ENCOUNTER — Encounter: Payer: Self-pay | Admitting: Nurse Practitioner

## 2015-03-04 VITALS — BP 126/88 | Ht 70.0 in | Wt 248.0 lb

## 2015-03-04 DIAGNOSIS — M7918 Myalgia, other site: Secondary | ICD-10-CM | POA: Insufficient documentation

## 2015-03-04 DIAGNOSIS — M549 Dorsalgia, unspecified: Secondary | ICD-10-CM

## 2015-03-04 DIAGNOSIS — Z13220 Encounter for screening for lipoid disorders: Secondary | ICD-10-CM

## 2015-03-04 DIAGNOSIS — M791 Myalgia: Secondary | ICD-10-CM | POA: Diagnosis not present

## 2015-03-04 DIAGNOSIS — G8929 Other chronic pain: Secondary | ICD-10-CM | POA: Diagnosis not present

## 2015-03-04 DIAGNOSIS — R5383 Other fatigue: Secondary | ICD-10-CM

## 2015-03-04 MED ORDER — HYDROCODONE-ACETAMINOPHEN 5-325 MG PO TABS: 1.0000 | ORAL_TABLET | ORAL | Status: DC | PRN

## 2015-03-04 MED ORDER — VALACYCLOVIR HCL 1 G PO TABS: ORAL_TABLET | ORAL | Status: DC

## 2015-03-05 ENCOUNTER — Encounter: Payer: Self-pay | Admitting: Nurse Practitioner

## 2015-03-05 LAB — ANA: Anti Nuclear Antibody(ANA): POSITIVE — AB

## 2015-03-05 LAB — SEDIMENTATION RATE: Sed Rate: 4 mm/h (ref 0–32)

## 2015-03-05 LAB — BASIC METABOLIC PANEL
BUN/Creatinine Ratio: 8 — ABNORMAL LOW (ref 9–23)
BUN: 8 mg/dL (ref 6–24)
CALCIUM: 8.9 mg/dL (ref 8.7–10.2)
CO2: 23 mmol/L (ref 18–29)
CREATININE: 1.01 mg/dL — AB (ref 0.57–1.00)
Chloride: 102 mmol/L (ref 97–108)
GFR calc Af Amer: 80 mL/min/{1.73_m2} (ref >59)
GFR calc non Af Amer: 69 mL/min/{1.73_m2} (ref >59)
Glucose: 94 mg/dL (ref 65–99)
Potassium: 4.4 mmol/L (ref 3.5–5.2)
Sodium: 140 mmol/L (ref 134–144)

## 2015-03-05 LAB — CBC WITH DIFFERENTIAL/PLATELET
Basophils Absolute: 0 10*3/uL (ref 0.0–0.2)
Basos: 0 %
EOS ABS: 0.1 10*3/uL (ref 0.0–0.4)
Eos: 1 %
HCT: 38 % (ref 34.0–46.6)
Hemoglobin: 12.6 g/dL (ref 11.1–15.9)
Immature Grans (Abs): 0 10*3/uL (ref 0.0–0.1)
Immature Granulocytes: 0 %
LYMPHS: 28 %
Lymphocytes Absolute: 1.9 10*3/uL (ref 0.7–3.1)
MCH: 30.1 pg (ref 26.6–33.0)
MCHC: 33.2 g/dL (ref 31.5–35.7)
MCV: 91 fL (ref 79–97)
Monocytes Absolute: 0.5 10*3/uL (ref 0.1–0.9)
Monocytes: 8 %
NEUTROS PCT: 63 %
Neutrophils Absolute: 4.2 10*3/uL (ref 1.4–7.0)
PLATELETS: 287 10*3/uL (ref 150–379)
RBC: 4.18 x10E6/uL (ref 3.77–5.28)
RDW: 14.6 % (ref 12.3–15.4)
WBC: 6.6 10*3/uL (ref 3.4–10.8)

## 2015-03-05 LAB — TSH: TSH: 1.81 u[IU]/mL (ref 0.450–4.500)

## 2015-03-05 LAB — HEPATIC FUNCTION PANEL
ALK PHOS: 84 IU/L (ref 39–117)
ALT: 12 IU/L (ref 0–32)
AST: 13 IU/L (ref 0–40)
Albumin: 4 g/dL (ref 3.5–5.5)
Bilirubin Total: 0.4 mg/dL (ref 0.0–1.2)
Bilirubin, Direct: 0.08 mg/dL (ref 0.00–0.40)
Total Protein: 6.4 g/dL (ref 6.0–8.5)

## 2015-03-05 LAB — LIPID PANEL
Chol/HDL Ratio: 4.1 ratio units (ref 0.0–4.4)
Cholesterol, Total: 218 mg/dL — ABNORMAL HIGH (ref 100–199)
HDL: 53 mg/dL (ref >39)
LDL CALC: 139 mg/dL — AB (ref 0–99)
TRIGLYCERIDES: 132 mg/dL (ref 0–149)
VLDL CHOLESTEROL CAL: 26 mg/dL (ref 5–40)

## 2015-03-05 LAB — VITAMIN D 25 HYDROXY (VIT D DEFICIENCY, FRACTURES): Vit D, 25-Hydroxy: 7.5 ng/mL — ABNORMAL LOW (ref 30.0–100.0)

## 2015-03-05 NOTE — Progress Notes (Signed)
Subjective:  Presents for routine follow-up. Continues to have generalized migratory muscle and ligament pain along with chronic back pain. Getting regular massage and "tendon release". Practices yoga. Has lost some weight. Has a history of a torn anterior cruciate ligament right knee which causes mild pain at times. Regular menses lasting 5-7 days, second day slightly heavy flow. Continues to be under a lot of stress particularly at work. Fatigue. Lack of interest in activities. Feels Celexa has done well overall but wonders if there may be another cause.  Objective:   BP 126/88 mmHg  Ht 5\' 10"  (1.778 m)  Wt 248 lb (112.492 kg)  BMI 35.58 kg/m2  LMP 01/19/2015 NAD. Alert, oriented. Mildly fatigued in appearance. Lungs clear. Heart regular rate rhythm. Tight tender muscles noted along the upper back and neck area more on the right.  Assessment: Myofascial pain - Plan: Hepatic function panel, Antinuclear Antib (ANA), Sedimentation rate  Chronic back pain  Other fatigue - Plan: CBC with Differential/Platelet, Hepatic function panel, Basic metabolic panel, TSH, Vit D  25 hydroxy (rtn osteoporosis monitoring)  Screening, lipid - Plan: Lipid panel  Plan:  Meds ordered this encounter  Medications  . HYDROcodone-acetaminophen (NORCO/VICODIN) 5-325 MG per tablet    Sig: Take 1 tablet by mouth every 4 (four) hours as needed for moderate pain.    Dispense:  30 tablet    Refill:  0    Order Specific Question:  Supervising Provider    Answer:  Mikey Kirschner [[id number]]  . valACYclovir (VALTREX) 1000 MG tablet    Sig: One po qd x 5 d prn    Dispense:  5 tablet    Refill:  5    Order Specific Question:  Supervising Provider    Answer:  Mikey Kirschner [[id number]]   Continue yoga massage and other natural therapies. Restart TENS unit. Discussed importance of stress reduction. Lab work pending. Consider adding Wellbutrin to regimen after lab work if it is normal. Also given prescription for Valtrex for  rare flareup of genital herpes which occurs about every 3-4 months. Return in about 3 months (around 06/04/2015) for recheck.

## 2015-03-10 ENCOUNTER — Other Ambulatory Visit: Payer: Self-pay | Admitting: Nurse Practitioner

## 2015-03-10 ENCOUNTER — Encounter: Payer: Self-pay | Admitting: Nurse Practitioner

## 2015-03-10 DIAGNOSIS — E559 Vitamin D deficiency, unspecified: Secondary | ICD-10-CM | POA: Insufficient documentation

## 2015-03-10 MED ORDER — VITAMIN D (ERGOCALCIFEROL) 1.25 MG (50000 UNIT) PO CAPS: 50000.0000 [IU] | ORAL_CAPSULE | ORAL | Status: DC

## 2015-03-12 ENCOUNTER — Other Ambulatory Visit: Payer: Self-pay | Admitting: Family Medicine

## 2015-03-17 ENCOUNTER — Other Ambulatory Visit: Payer: Self-pay | Admitting: Family Medicine

## 2015-03-22 ENCOUNTER — Other Ambulatory Visit: Payer: Self-pay | Admitting: Nurse Practitioner

## 2015-04-08 ENCOUNTER — Encounter: Payer: Self-pay | Admitting: Nurse Practitioner

## 2015-04-10 ENCOUNTER — Other Ambulatory Visit: Payer: Self-pay | Admitting: Nurse Practitioner

## 2015-04-10 MED ORDER — VALACYCLOVIR HCL 1 G PO TABS
1000.0000 mg | ORAL_TABLET | Freq: Every day | ORAL | Status: DC
Start: 2015-04-10 — End: 2016-05-05

## 2015-04-27 ENCOUNTER — Other Ambulatory Visit: Payer: Self-pay | Admitting: Family Medicine

## 2015-05-19 ENCOUNTER — Other Ambulatory Visit: Payer: Self-pay

## 2015-05-19 MED ORDER — CITALOPRAM HYDROBROMIDE 40 MG PO TABS: 40.0000 mg | ORAL_TABLET | Freq: Every day | ORAL | Status: DC

## 2015-06-22 ENCOUNTER — Other Ambulatory Visit: Payer: Self-pay | Admitting: *Deleted

## 2015-06-22 MED ORDER — GABAPENTIN 300 MG PO CAPS: ORAL_CAPSULE | ORAL | Status: DC

## 2015-06-24 ENCOUNTER — Other Ambulatory Visit: Payer: Self-pay | Admitting: Nurse Practitioner

## 2015-06-24 MED ORDER — GABAPENTIN 300 MG PO CAPS: ORAL_CAPSULE | ORAL | Status: DC

## 2015-06-29 ENCOUNTER — Other Ambulatory Visit: Payer: Self-pay | Admitting: Family Medicine

## 2015-06-29 ENCOUNTER — Other Ambulatory Visit: Payer: Self-pay | Admitting: Nurse Practitioner

## 2015-06-30 NOTE — Telephone Encounter (Signed)
Just refilled ambien. New request is for Xanax for bedtime use. She should not be taking both at the same time. Please ask her to choose one. Using both of these together increases her risk of excessive sedation and respiratory issues.

## 2015-07-01 ENCOUNTER — Encounter: Payer: Self-pay | Admitting: Nurse Practitioner

## 2015-07-02 ENCOUNTER — Other Ambulatory Visit: Payer: Self-pay | Admitting: Nurse Practitioner

## 2015-07-02 MED ORDER — ALPRAZOLAM 1 MG PO TABS: ORAL_TABLET | ORAL | Status: DC

## 2015-07-02 MED ORDER — HYDROCODONE-ACETAMINOPHEN 5-325 MG PO TABS: 1.0000 | ORAL_TABLET | ORAL | Status: DC | PRN

## 2015-07-04 ENCOUNTER — Other Ambulatory Visit: Payer: Self-pay | Admitting: Nurse Practitioner

## 2015-07-09 ENCOUNTER — Other Ambulatory Visit: Payer: Self-pay | Admitting: Nurse Practitioner

## 2015-08-24 ENCOUNTER — Other Ambulatory Visit: Payer: Self-pay | Admitting: Family Medicine

## 2015-08-26 ENCOUNTER — Encounter: Payer: Federal, State, Local not specified - PPO | Admitting: Nurse Practitioner

## 2015-10-01 ENCOUNTER — Other Ambulatory Visit: Payer: Self-pay | Admitting: Nurse Practitioner

## 2015-10-26 ENCOUNTER — Other Ambulatory Visit: Payer: Self-pay | Admitting: Nurse Practitioner

## 2015-11-24 ENCOUNTER — Other Ambulatory Visit: Payer: Self-pay | Admitting: Nurse Practitioner

## 2015-12-01 ENCOUNTER — Other Ambulatory Visit: Payer: Self-pay | Admitting: Family Medicine

## 2015-12-02 NOTE — Telephone Encounter (Signed)
30 d ov with carolyn before furter seen ten mo ago and advised three mo f u

## 2015-12-11 ENCOUNTER — Other Ambulatory Visit: Payer: Self-pay | Admitting: Family Medicine

## 2015-12-14 NOTE — Telephone Encounter (Signed)
30 days, no ref, carolyn last saw in march and rec f u in three months, needs o v with carolyn plz sched

## 2015-12-18 ENCOUNTER — Other Ambulatory Visit: Payer: Self-pay | Admitting: Family Medicine

## 2015-12-18 MED ORDER — CITALOPRAM HYDROBROMIDE 40 MG PO TABS: 40.0000 mg | ORAL_TABLET | Freq: Every day | ORAL | Status: DC

## 2015-12-18 NOTE — Addendum Note (Signed)
Addended by: Ofilia Neas R on: 12/18/2015 11:19 AM   Modules accepted: Orders

## 2015-12-18 NOTE — Telephone Encounter (Signed)
Refill whatever amnt pt needs and new rx if nec, see my prior note, still needs to f u with carolyn

## 2015-12-29 ENCOUNTER — Encounter: Payer: Self-pay | Admitting: Nurse Practitioner

## 2016-01-04 ENCOUNTER — Encounter: Payer: Self-pay | Admitting: Nurse Practitioner

## 2016-01-04 ENCOUNTER — Ambulatory Visit (INDEPENDENT_AMBULATORY_CARE_PROVIDER_SITE_OTHER): Payer: Managed Care, Other (non HMO) | Admitting: Nurse Practitioner

## 2016-01-04 VITALS — BP 120/80 | Ht 69.0 in | Wt 254.0 lb

## 2016-01-04 DIAGNOSIS — N760 Acute vaginitis: Secondary | ICD-10-CM | POA: Diagnosis not present

## 2016-01-04 DIAGNOSIS — A499 Bacterial infection, unspecified: Secondary | ICD-10-CM

## 2016-01-04 DIAGNOSIS — Z0000 Encounter for general adult medical examination without abnormal findings: Secondary | ICD-10-CM

## 2016-01-04 DIAGNOSIS — Z01419 Encounter for gynecological examination (general) (routine) without abnormal findings: Secondary | ICD-10-CM

## 2016-01-04 DIAGNOSIS — B9689 Other specified bacterial agents as the cause of diseases classified elsewhere: Secondary | ICD-10-CM

## 2016-01-04 MED ORDER — PHENTERMINE HCL 37.5 MG PO TABS: 37.5000 mg | ORAL_TABLET | Freq: Every day | ORAL | Status: DC

## 2016-01-04 MED ORDER — METRONIDAZOLE 500 MG PO TABS: 500.0000 mg | ORAL_TABLET | Freq: Two times a day (BID) | ORAL | Status: DC

## 2016-01-04 MED ORDER — HYDROCODONE-ACETAMINOPHEN 5-325 MG PO TABS: 1.0000 | ORAL_TABLET | ORAL | Status: DC | PRN

## 2016-01-04 NOTE — Patient Instructions (Signed)
contrave

## 2016-01-07 ENCOUNTER — Encounter: Payer: Self-pay | Admitting: Nurse Practitioner

## 2016-01-07 LAB — POCT WET PREP WITH KOH
Clue Cells Wet Prep HPF POC: POSITIVE
KOH PREP POC: POSITIVE
PH WET PREP: 5
RBC WET PREP PER HPF POC: NEGATIVE
Trichomonas, UA: NEGATIVE
Yeast Wet Prep HPF POC: NEGATIVE

## 2016-01-07 NOTE — Progress Notes (Addendum)
Subjective:    Patient ID: Barbara Yates, female    DOB: October 24, 1971, 45 y.o.   MRN: [id number]  HPI presents for her wellness exam. Regular cycles, normal flow. Same sexual partner. Regular vision and dental exams. Struggled some with weight and activity during the holidays. Rare use of hydrocodone only for extreme back pain. Would like to restart phentermine. Has taken in the past without difficulty.     Review of Systems  Constitutional: Negative for activity change, appetite change and fatigue.  HENT: Negative for dental problem, ear pain, sinus pressure and sore throat.   Respiratory: Negative for cough, chest tightness, shortness of breath and wheezing.   Cardiovascular: Negative for chest pain.  Gastrointestinal: Negative for nausea, vomiting, abdominal pain, diarrhea, constipation and abdominal distention.  Genitourinary: Negative for dysuria, urgency, frequency, vaginal discharge, enuresis, difficulty urinating, genital sores, menstrual problem and pelvic pain.       Objective:   Physical Exam  Constitutional: She is oriented to person, place, and time. She appears well-developed. No distress.  HENT:  Right Ear: External ear normal.  Left Ear: External ear normal.  Mouth/Throat: Oropharynx is clear and moist.  Neck: Normal range of motion. Neck supple. No tracheal deviation present. No thyromegaly present.  Cardiovascular: Normal rate, regular rhythm and normal heart sounds.  Exam reveals no gallop.   No murmur heard. Pulmonary/Chest: Effort normal and breath sounds normal.  Abdominal: Soft. She exhibits no distension. There is no tenderness.  Genitourinary: Vagina normal and uterus normal. No vaginal discharge found.  External GU: no rashes or lesions. Vagina: moderate amount of slightly yellowish mucoid discharge. No CMT. Bimanual exam: no tenderness or obvious masses.   Musculoskeletal: She exhibits no edema.  Lymphadenopathy:    She has no cervical adenopathy.    Neurological: She is alert and oriented to person, place, and time.  Skin: Skin is warm and dry. No rash noted.  Psychiatric: She has a normal mood and affect. Her behavior is normal.  Vitals reviewed. Breast exam: areas of dense tissue; no dominant masses; axillae no adenopathy.  Results for orders placed or performed in visit on 01/04/16  POCT Wet Prep with KOH  Result Value Ref Range   Trichomonas, UA Negative    Clue Cells Wet Prep HPF POC pos    Epithelial Wet Prep HPF POC Moderate Few, Moderate, Many, Too numerous to count   Yeast Wet Prep HPF POC neg    Bacteria Wet Prep HPF POC Few None, Few, Too numerous to count   RBC Wet Prep HPF POC neg    WBC Wet Prep HPF POC rare    KOH Prep POC Positive    pH, Wet Prep 5.0           Assessment & Plan:   Problem List Items Addressed This Visit      Other   Morbid obesity due to excess calories (HCC)   Relevant Medications   phentermine (ADIPEX-P) 37.5 MG tablet    Other Visit Diagnoses    Well woman exam    -  Primary    Bacterial vaginosis        Relevant Medications    metroNIDAZOLE (FLAGYL) 500 MG tablet    Other Relevant Orders    POCT Wet Prep with KOH (Completed)       Meds ordered this encounter  Medications  . HYDROcodone-acetaminophen (NORCO/VICODIN) 5-325 MG tablet    Sig: Take 1 tablet by mouth every 4 (four) hours as needed  for moderate pain.    Dispense:  30 tablet    Refill:  0    Order Specific Question:  Supervising Provider    Answer:  Mikey Kirschner [[id number]]  . metroNIDAZOLE (FLAGYL) 500 MG tablet    Sig: Take 1 tablet (500 mg total) by mouth 2 (two) times daily with a meal.    Dispense:  14 tablet    Refill:  0    Order Specific Question:  Supervising Provider    Answer:  Mikey Kirschner [[id number]]  . phentermine (ADIPEX-P) 37.5 MG tablet    Sig: Take 1 tablet (37.5 mg total) by mouth daily before breakfast.    Dispense:  30 tablet    Refill:  2    Order Specific Question:  Supervising  Provider    Answer:  Mikey Kirschner [[id number]]   Encouraged regular exercise, healthy diet and weight loss. Daily vitamin D and calcium supplementation. Return in about 6 months (around 07/03/2016) for recheck. Recommend routine lab work at that time. Note: the nurse spoke with patient about mammogram but she did not have her schedule; she plans to schedule this on her own.

## 2016-01-18 ENCOUNTER — Other Ambulatory Visit: Payer: Self-pay | Admitting: *Deleted

## 2016-01-18 ENCOUNTER — Other Ambulatory Visit: Payer: Self-pay | Admitting: Nurse Practitioner

## 2016-01-18 ENCOUNTER — Other Ambulatory Visit: Payer: Self-pay | Admitting: Family Medicine

## 2016-01-18 MED ORDER — CITALOPRAM HYDROBROMIDE 40 MG PO TABS: 40.0000 mg | ORAL_TABLET | Freq: Every day | ORAL | Status: DC

## 2016-02-03 ENCOUNTER — Other Ambulatory Visit: Payer: Self-pay

## 2016-02-03 MED ORDER — GABAPENTIN 300 MG PO CAPS: ORAL_CAPSULE | ORAL | Status: DC

## 2016-02-03 NOTE — Telephone Encounter (Signed)
Patient requesting 90 day supply.

## 2016-02-03 NOTE — Progress Notes (Signed)
Ok six ref 

## 2016-02-04 MED ORDER — NAPROXEN 500 MG PO TABS: 500.0000 mg | ORAL_TABLET | Freq: Two times a day (BID) | ORAL | Status: DC

## 2016-02-16 ENCOUNTER — Other Ambulatory Visit: Payer: Self-pay | Admitting: Nurse Practitioner

## 2016-02-17 MED ORDER — HYDROCODONE-ACETAMINOPHEN 5-325 MG PO TABS: 1.0000 | ORAL_TABLET | ORAL | Status: DC | PRN

## 2016-02-17 NOTE — Addendum Note (Signed)
Addended by: Nilda Simmer on: 02/17/2016 08:01 AM   Modules accepted: Orders

## 2016-02-18 ENCOUNTER — Encounter: Payer: Self-pay | Admitting: Nurse Practitioner

## 2016-03-08 ENCOUNTER — Other Ambulatory Visit: Payer: Self-pay | Admitting: Nurse Practitioner

## 2016-03-21 ENCOUNTER — Other Ambulatory Visit: Payer: Self-pay | Admitting: Family Medicine

## 2016-03-22 NOTE — Telephone Encounter (Signed)
Ok six mo 

## 2016-03-28 ENCOUNTER — Other Ambulatory Visit: Payer: Self-pay | Admitting: Nurse Practitioner

## 2016-04-06 ENCOUNTER — Encounter: Payer: Self-pay | Admitting: Nurse Practitioner

## 2016-04-08 ENCOUNTER — Other Ambulatory Visit: Payer: Self-pay | Admitting: Nurse Practitioner

## 2016-04-08 MED ORDER — PHENTERMINE HCL 37.5 MG PO TABS: 37.5000 mg | ORAL_TABLET | Freq: Every day | ORAL | Status: DC

## 2016-04-11 ENCOUNTER — Other Ambulatory Visit: Payer: Self-pay | Admitting: Nurse Practitioner

## 2016-05-05 ENCOUNTER — Other Ambulatory Visit: Payer: Self-pay | Admitting: Nurse Practitioner

## 2016-05-16 ENCOUNTER — Encounter: Payer: Self-pay | Admitting: Nurse Practitioner

## 2016-05-18 ENCOUNTER — Other Ambulatory Visit: Payer: Self-pay | Admitting: Nurse Practitioner

## 2016-05-18 MED ORDER — NALTREXONE-BUPROPION HCL ER 8-90 MG PO TB12: ORAL_TABLET | ORAL | Status: DC

## 2016-06-06 ENCOUNTER — Other Ambulatory Visit: Payer: Self-pay | Admitting: Nurse Practitioner

## 2016-06-15 ENCOUNTER — Other Ambulatory Visit: Payer: Self-pay | Admitting: Nurse Practitioner

## 2016-07-01 ENCOUNTER — Ambulatory Visit: Payer: Managed Care, Other (non HMO) | Admitting: Nurse Practitioner

## 2016-07-15 ENCOUNTER — Other Ambulatory Visit: Payer: Self-pay | Admitting: Nurse Practitioner

## 2016-07-25 ENCOUNTER — Other Ambulatory Visit: Payer: Self-pay | Admitting: Family Medicine

## 2016-10-03 ENCOUNTER — Other Ambulatory Visit: Payer: Self-pay | Admitting: Nurse Practitioner

## 2016-10-04 NOTE — Telephone Encounter (Signed)
Ok plus 2 ref 

## 2016-10-19 ENCOUNTER — Other Ambulatory Visit: Payer: Self-pay | Admitting: Nurse Practitioner

## 2016-12-12 ENCOUNTER — Other Ambulatory Visit: Payer: Self-pay | Admitting: Family Medicine

## 2016-12-13 NOTE — Telephone Encounter (Signed)
Dr. Steeves patient 

## 2016-12-14 NOTE — Telephone Encounter (Signed)
Ok times one 

## 2017-01-11 ENCOUNTER — Encounter: Payer: Self-pay | Admitting: Nurse Practitioner

## 2017-01-11 ENCOUNTER — Ambulatory Visit (INDEPENDENT_AMBULATORY_CARE_PROVIDER_SITE_OTHER): Payer: Managed Care, Other (non HMO) | Admitting: Nurse Practitioner

## 2017-01-11 VITALS — BP 128/84 | Ht 69.0 in | Wt 242.0 lb

## 2017-01-11 DIAGNOSIS — M791 Myalgia: Secondary | ICD-10-CM

## 2017-01-11 DIAGNOSIS — Z13220 Encounter for screening for lipoid disorders: Secondary | ICD-10-CM

## 2017-01-11 DIAGNOSIS — Z124 Encounter for screening for malignant neoplasm of cervix: Secondary | ICD-10-CM | POA: Diagnosis not present

## 2017-01-11 DIAGNOSIS — Z1231 Encounter for screening mammogram for malignant neoplasm of breast: Secondary | ICD-10-CM

## 2017-01-11 DIAGNOSIS — F329 Major depressive disorder, single episode, unspecified: Secondary | ICD-10-CM | POA: Diagnosis not present

## 2017-01-11 DIAGNOSIS — F419 Anxiety disorder, unspecified: Secondary | ICD-10-CM | POA: Diagnosis not present

## 2017-01-11 DIAGNOSIS — Z1151 Encounter for screening for human papillomavirus (HPV): Secondary | ICD-10-CM

## 2017-01-11 DIAGNOSIS — E559 Vitamin D deficiency, unspecified: Secondary | ICD-10-CM | POA: Diagnosis not present

## 2017-01-11 DIAGNOSIS — M7918 Myalgia, other site: Secondary | ICD-10-CM

## 2017-01-11 DIAGNOSIS — Z0000 Encounter for general adult medical examination without abnormal findings: Secondary | ICD-10-CM

## 2017-01-11 DIAGNOSIS — F32A Depression, unspecified: Secondary | ICD-10-CM

## 2017-01-11 DIAGNOSIS — G4700 Insomnia, unspecified: Secondary | ICD-10-CM | POA: Diagnosis not present

## 2017-01-11 MED ORDER — ZOLPIDEM TARTRATE 10 MG PO TABS: 10.0000 mg | ORAL_TABLET | Freq: Every evening | ORAL | 1 refills | Status: DC | PRN

## 2017-01-11 MED ORDER — HYDROCODONE-ACETAMINOPHEN 5-325 MG PO TABS: 1.0000 | ORAL_TABLET | ORAL | 0 refills | Status: DC | PRN

## 2017-01-11 MED ORDER — ALPRAZOLAM 1 MG PO TABS: ORAL_TABLET | ORAL | 2 refills | Status: DC

## 2017-01-11 NOTE — Progress Notes (Signed)
Subjective:    Patient ID: Barbara Yates, female    DOB: [birth year], 46 y.o.   MRN: [id number]  HPI: 46 yo female seen today for wellness exam.  Reports her last dental and vision exam were approx. 2 years ago.  Last mammogram on record was 2009.  Per pt. Report did not complete last time ordered.  Last PAP 08/2014, no new sexual partners, LMP one week ago, no changes in current menstrual cycle, normal cycle with normal flow, last seven days. Reports eating healthy diet and walking on treadmill 2-3days per week.    Pt. Requesting refill on several medications including pain and depression medication.  Back pain is chronic and has not taken Hydrocodone in 6 months d/t financial strain.  Current regimen includes flexeril and OTC Naproxen with mild relief. Has been under tremendous stress due to home foreclosure. Has periods of depression mixed with periods of wakefulness and being wired for 48 hours. Takes Xanax several days per week during the day to help with anxiety. Takes Ambien occasionally for sleep. A 30 day Rx will last 3 months. Pain med 30 pills will last at least 2 months. For back pain will take Hydrocodone, Flexeril and Naproxen. Does not take Xanax, Ambien and pain med together.     Review of Systems  Constitutional: Negative for activity change, appetite change, chills, fatigue, fever and unexpected weight change.  HENT: Negative for dental problem, ear pain, sinus pressure and sore throat.   Respiratory: Negative for cough, chest tightness, shortness of breath and wheezing.   Cardiovascular: Negative for chest pain, palpitations and leg swelling.  Gastrointestinal: Negative for abdominal distention, abdominal pain, constipation, diarrhea, nausea and vomiting.  Genitourinary: Negative for difficulty urinating, dysuria, enuresis, frequency, genital sores, menstrual problem, pelvic pain, urgency, vaginal discharge and vaginal pain.  Musculoskeletal: Positive for back pain. Negative  for gait problem.       Chronic back pain, no new injuries/changes in symptoms  Neurological: Negative for dizziness and weakness.  Psychiatric/Behavioral: Positive for sleep disturbance. Negative for suicidal ideas.       Occasional nights patients feels "wired", wont sleep for 48hrs.  Ambien used prn with good tolerance and efficacy.     [place] reviewed.     Objective:   Physical Exam  Constitutional: She is oriented to person, place, and time. She appears well-developed. No distress.  HENT:  Right Ear: External ear normal.  Left Ear: External ear normal.  Mouth/Throat: Oropharynx is clear and moist.  Neck: Normal range of motion. Neck supple. No tracheal deviation present. No thyromegaly present.  Cardiovascular: Normal rate, regular rhythm and normal heart sounds.  Exam reveals no gallop.   No murmur heard. Pulmonary/Chest: Effort normal and breath sounds normal.  Abdominal: Soft. Bowel sounds are normal. She exhibits no distension. There is no tenderness.  Genitourinary: Uterus normal. There is no rash on the left labia. Vaginal discharge found.  Genitourinary Comments: External: no rashes or lesions Internal: White, thin discharge, no odor  Bimanual exam: no CMT, no obvious masses palpable   Musculoskeletal: She exhibits no edema.  Lymphadenopathy:    She has no cervical adenopathy.  Neurological: She is alert and oriented to person, place, and time.  Skin: Skin is warm and dry. No rash noted.  Psychiatric: She has a normal mood and affect. Her behavior is normal.  Vitals reviewed.  Breasts: symmetrical, no changes in color, no discharge, no obvious palpable masses, axillae no adenopathy.  Assessment & Plan:   Problem List Items Addressed This Visit      Other   Anxiety   Relevant Medications   ALPRAZolam (XANAX) 1 MG tablet   Depression   Relevant Medications   ALPRAZolam (XANAX) 1 MG tablet   Insomnia   Myofascial pain   Vitamin D deficiency   Relevant  Orders   VITAMIN D 25 Hydroxy (Vit-D Deficiency, Fractures)    Other Visit Diagnoses    Routine general medical examination at a health care facility    -  Primary   Relevant Orders   Pap IG and HPV (high risk) DNA detection   Lipid panel   CBC with Differential/Platelet   Hepatic function panel   Basic metabolic panel   VITAMIN D 25 Hydroxy (Vit-D Deficiency, Fractures)   MM DIGITAL SCREENING BILATERAL   Screening for cervical cancer       Relevant Orders   Pap IG and HPV (high risk) DNA detection   Screening for HPV (human papillomavirus)       Relevant Orders   Pap IG and HPV (high risk) DNA detection   Screening for lipid disorders       Relevant Orders   Lipid panel   Encounter for screening mammogram for breast cancer       Relevant Orders   MM DIGITAL SCREENING BILATERAL       Meds ordered this encounter  Medications  . DISCONTD: Hydrocodone-Acetaminophen (VICODIN PO)    Sig: Take by mouth.  . DISCONTD: HYDROcodone-acetaminophen (NORCO/VICODIN) 5-325 MG tablet    Sig: Take 1 tablet by mouth every 4 (four) hours as needed.    Dispense:  30 tablet    Refill:  0    Order Specific Question:   Supervising Provider    Answer:   Mikey Kirschner [[id number]]  . ALPRAZolam (XANAX) 1 MG tablet    Sig: TAKE 1 TABLET EVERY DAY AS NEEDED FOR ANXIETY    Dispense:  30 tablet    Refill:  2    Not to exceed 3 additional fills before 12/03/2016    Order Specific Question:   Supervising Provider    Answer:   Mikey Kirschner [[id number]]  . zolpidem (AMBIEN) 10 MG tablet    Sig: Take 1 tablet (10 mg total) by mouth at bedtime as needed. for sleep    Dispense:  30 tablet    Refill:  1    This request is for a new prescription for a controlled substance as required by Federal/State law.    Order Specific Question:   Supervising Provider    Answer:   Mikey Kirschner [[id number]]  . HYDROcodone-acetaminophen (NORCO/VICODIN) 5-325 MG tablet    Sig: Take 1 tablet by mouth every 4 (four) hours  as needed.    Dispense:  30 tablet    Refill:  0    May fill 30 days from 01/11/17    Order Specific Question:   Supervising Provider    Answer:   Maggie Font   [place] reviewed  Extensive education provided regarding pain management and safety.  Plan developed, pt agrees she will discontinue wine intake to take Hydrocodone for pain or any other medications that cause drowsiness and will also not combine pain medications with her sleep medication.  All other warnings reviewed with patient.    Continue on Celexa. Discussed possible bipolar diagnosis. Recommend she consider Psych consult.  Warning signs reviewed Contact office for changes in mood or behavior  or to request change in medication therapy.    Return in about 3 months (around 04/10/2017) for recheck.

## 2017-01-12 ENCOUNTER — Other Ambulatory Visit: Payer: Self-pay | Admitting: Family Medicine

## 2017-01-14 LAB — PAP IG AND HPV HIGH-RISK
HPV, HIGH-RISK: NEGATIVE
PAP SMEAR COMMENT: 0

## 2017-01-23 ENCOUNTER — Other Ambulatory Visit: Payer: Self-pay | Admitting: Family Medicine

## 2017-01-25 NOTE — Telephone Encounter (Signed)
Ok six mo worth 

## 2017-03-01 ENCOUNTER — Encounter: Payer: Self-pay | Admitting: Family Medicine

## 2017-03-01 ENCOUNTER — Ambulatory Visit (INDEPENDENT_AMBULATORY_CARE_PROVIDER_SITE_OTHER): Payer: Managed Care, Other (non HMO) | Admitting: Family Medicine

## 2017-03-01 VITALS — BP 134/90 | Ht 69.0 in | Wt 243.4 lb

## 2017-03-01 DIAGNOSIS — J329 Chronic sinusitis, unspecified: Secondary | ICD-10-CM | POA: Diagnosis not present

## 2017-03-01 DIAGNOSIS — J111 Influenza due to unidentified influenza virus with other respiratory manifestations: Secondary | ICD-10-CM | POA: Diagnosis not present

## 2017-03-01 MED ORDER — AMOXICILLIN-POT CLAVULANATE 875-125 MG PO TABS: 1.0000 | ORAL_TABLET | Freq: Two times a day (BID) | ORAL | 0 refills | Status: AC

## 2017-03-01 MED ORDER — HYDROCODONE-HOMATROPINE 5-1.5 MG/5ML PO SYRP: 5.0000 mL | ORAL_SOLUTION | Freq: Four times a day (QID) | ORAL | 0 refills | Status: DC | PRN

## 2017-03-01 NOTE — Progress Notes (Signed)
   Subjective:    Patient ID: Barbara Yates, female    DOB: 12/16/1970, 46 y.o.   MRN: [id number]  Influenza  This is a new problem. The current episode started in the past 7 days. Associated symptoms include congestion, coughing, a fever, headaches and a sore throat. Treatments tried: Nyquil, Dayquil, Tylenol, Aleve, Phenergan, flonase.   Patient states no other concerns this visit.   Hit on firday with fatigue an dheadache and scratch throat  Congestion   Muscle spasm in th leg  Coughing then stated by third  Appetite  No   enrgy level not good   [facility]   [facility]  Told to take flonase and muinex      Review of Systems  Constitutional: Positive for fever.  HENT: Positive for congestion and sore throat.   Respiratory: Positive for cough.   Neurological: Positive for headaches.       Objective:   Physical Exam  Alert vitals reviewed, moderate malaise. Hydration good. Positive nasal congestion lungs no crackles or wheezes, no tachypnea, intermittent bronchial cough during exam heart regular rate and rhythm.       Assessment & Plan:  Impression influenza discussed at length. Petra Kuba of illness and potential sequela discussed. Plan Tamiflu prescribed if indicated and timing appropriate, Unfortunately 28, we'll press on and cover with Augmentin for secondary sinusitis/bronchitis.. Symptom care discussed. Warning signs discussed. WSL

## 2017-04-03 ENCOUNTER — Other Ambulatory Visit: Payer: Self-pay | Admitting: Family Medicine

## 2017-04-05 ENCOUNTER — Other Ambulatory Visit: Payer: Self-pay | Admitting: Family Medicine

## 2017-04-11 ENCOUNTER — Ambulatory Visit: Payer: Managed Care, Other (non HMO) | Admitting: Nurse Practitioner

## 2017-04-24 ENCOUNTER — Other Ambulatory Visit: Payer: Self-pay | Admitting: Family Medicine

## 2017-04-24 NOTE — Telephone Encounter (Signed)
Ok plus 2 monthly ref 

## 2017-05-22 ENCOUNTER — Other Ambulatory Visit: Payer: Self-pay | Admitting: Nurse Practitioner

## 2017-06-29 ENCOUNTER — Other Ambulatory Visit: Payer: Self-pay | Admitting: Nurse Practitioner

## 2017-06-29 NOTE — Telephone Encounter (Signed)
Last seen 01/11/17 for a wellness  

## 2017-08-13 ENCOUNTER — Other Ambulatory Visit: Payer: Self-pay | Admitting: Family Medicine

## 2017-08-14 NOTE — Telephone Encounter (Signed)
30 d only needs o v 

## 2017-08-14 NOTE — Telephone Encounter (Signed)
Last seen 01/11/17

## 2017-08-30 ENCOUNTER — Other Ambulatory Visit: Payer: Self-pay | Admitting: Family Medicine

## 2017-09-13 ENCOUNTER — Other Ambulatory Visit: Payer: Self-pay | Admitting: Family Medicine

## 2017-09-13 NOTE — Telephone Encounter (Signed)
30 d ok, due ck up with carolyn

## 2017-09-13 NOTE — Telephone Encounter (Signed)
Last seen 01/11/17 for a wellness

## 2017-09-30 ENCOUNTER — Other Ambulatory Visit: Payer: Self-pay | Admitting: Family Medicine

## 2017-10-31 ENCOUNTER — Other Ambulatory Visit: Payer: Self-pay | Admitting: Nurse Practitioner

## 2017-11-06 ENCOUNTER — Other Ambulatory Visit: Payer: Self-pay | Admitting: Family Medicine

## 2017-12-12 ENCOUNTER — Other Ambulatory Visit: Payer: Self-pay | Admitting: Nurse Practitioner

## 2018-01-30 ENCOUNTER — Other Ambulatory Visit: Payer: Self-pay | Admitting: Nurse Practitioner

## 2018-02-08 ENCOUNTER — Other Ambulatory Visit: Payer: Self-pay | Admitting: Nurse Practitioner

## 2018-03-29 ENCOUNTER — Other Ambulatory Visit: Payer: Self-pay | Admitting: Nurse Practitioner

## 2018-04-27 ENCOUNTER — Other Ambulatory Visit: Payer: Self-pay | Admitting: Nurse Practitioner

## 2018-05-02 ENCOUNTER — Other Ambulatory Visit: Payer: Self-pay | Admitting: Family Medicine

## 2018-05-15 ENCOUNTER — Other Ambulatory Visit: Payer: Self-pay | Admitting: Family Medicine

## 2018-05-15 ENCOUNTER — Other Ambulatory Visit: Payer: Self-pay | Admitting: Nurse Practitioner

## 2018-05-16 NOTE — Telephone Encounter (Signed)
30 tabs no ref

## 2018-05-16 NOTE — Telephone Encounter (Signed)
Controlled substances not seen for over a yr, 30 ea no refill needs f u appt before further

## 2018-05-29 ENCOUNTER — Other Ambulatory Visit: Payer: Self-pay | Admitting: Family Medicine

## 2018-06-30 ENCOUNTER — Other Ambulatory Visit: Payer: Self-pay | Admitting: Family Medicine

## 2018-07-22 ENCOUNTER — Other Ambulatory Visit: Payer: Self-pay | Admitting: Family Medicine

## 2018-07-24 ENCOUNTER — Other Ambulatory Visit: Payer: Self-pay | Admitting: Family Medicine

## 2018-08-02 ENCOUNTER — Encounter: Payer: Self-pay | Admitting: Family Medicine

## 2018-08-09 NOTE — Telephone Encounter (Signed)
One mo worth 

## 2018-08-14 ENCOUNTER — Ambulatory Visit: Payer: Managed Care, Other (non HMO) | Admitting: Family Medicine

## 2018-09-12 ENCOUNTER — Other Ambulatory Visit: Payer: Self-pay | Admitting: Family Medicine

## 2018-09-12 ENCOUNTER — Ambulatory Visit: Payer: Managed Care, Other (non HMO) | Admitting: Family Medicine

## 2018-09-12 MED ORDER — ALPRAZOLAM 1 MG PO TABS: ORAL_TABLET | ORAL | 0 refills | Status: DC

## 2018-09-12 MED ORDER — CYCLOBENZAPRINE HCL 10 MG PO TABS: ORAL_TABLET | ORAL | 0 refills | Status: DC

## 2018-09-12 NOTE — Telephone Encounter (Signed)
Last seen 02/2017. Consult with Dr Nicki Reaper- Patient may have 7 pills to last till appt. Prescriptions sent to pharmacy. Patient notified and verbalized understanding.

## 2018-09-12 NOTE — Telephone Encounter (Signed)
Pt is needing a refill on ALPRAZolam (XANAX) 1 MG tablet and cyclobenzaprine (FLEXERIL) 10 MG tablet. Please send to [id number] - [place], [place] - [address].   pts next OV is 09/24/18

## 2018-09-24 ENCOUNTER — Ambulatory Visit: Payer: Managed Care, Other (non HMO) | Admitting: Family Medicine

## 2018-10-10 ENCOUNTER — Ambulatory Visit (INDEPENDENT_AMBULATORY_CARE_PROVIDER_SITE_OTHER): Payer: Managed Care, Other (non HMO) | Admitting: Family Medicine

## 2018-10-10 ENCOUNTER — Encounter: Payer: Self-pay | Admitting: Family Medicine

## 2018-10-10 VITALS — BP 132/84 | Ht 68.0 in | Wt 244.2 lb

## 2018-10-10 DIAGNOSIS — F419 Anxiety disorder, unspecified: Secondary | ICD-10-CM | POA: Diagnosis not present

## 2018-10-10 DIAGNOSIS — Z13220 Encounter for screening for lipoid disorders: Secondary | ICD-10-CM | POA: Diagnosis not present

## 2018-10-10 DIAGNOSIS — E559 Vitamin D deficiency, unspecified: Secondary | ICD-10-CM | POA: Diagnosis not present

## 2018-10-10 DIAGNOSIS — Z0000 Encounter for general adult medical examination without abnormal findings: Secondary | ICD-10-CM | POA: Diagnosis not present

## 2018-10-10 DIAGNOSIS — Z1329 Encounter for screening for other suspected endocrine disorder: Secondary | ICD-10-CM

## 2018-10-10 DIAGNOSIS — F329 Major depressive disorder, single episode, unspecified: Secondary | ICD-10-CM

## 2018-10-10 DIAGNOSIS — Z1231 Encounter for screening mammogram for malignant neoplasm of breast: Secondary | ICD-10-CM | POA: Diagnosis not present

## 2018-10-10 DIAGNOSIS — F32A Depression, unspecified: Secondary | ICD-10-CM

## 2018-10-10 MED ORDER — ALPRAZOLAM 1 MG PO TABS: ORAL_TABLET | ORAL | 0 refills | Status: DC

## 2018-10-10 MED ORDER — ZOLPIDEM TARTRATE 10 MG PO TABS: ORAL_TABLET | ORAL | 5 refills | Status: DC

## 2018-10-10 NOTE — Progress Notes (Signed)
Subjective:    Patient ID: Barbara Yates, female    DOB: 17-Feb-1971, 47 y.o.   MRN: [id number]  HPI  The patient comes in today for a wellness visit.  A review of their health history was completed. A review of medications was also completed.  Any needed refills; just about everything  Eating habits: health conscious; reports stress eating frequently - feels like she eats too much fast food; has cut back on soda intake; has seen a nutritionist previously.   Falls/  MVA accidents in past few months: none  Regular exercise: yoga and walking as pain tolerates; yoga 2x per week, [employer] doing elliptical 2x per week.   Specialist pt sees on regular basis: none  Preventative health issues were discussed. Needs mammogram - would like somewhere in [place]. Last mammogram about 4 years ago and normal. No hx of breast cancer in the family.   Additional concerns: pt states her cycle came on yesterday so she is unable to do pap smear but would like breast exam. Pt would like to speak with provider about weight.   LMP: 10/09/18, cycles are irregular, reports decreased amount of time in between cycles over the last couple years. Menses x 7 days and normal per pt. Sexually active - has tubes tied and uses condoms.   Depression/Anxiety: reviewed GAD-7 and PHQ-9: denies SI/HI, denies any suicidal plans, is still taking celexa daily, can tell a difference if she misses her dose which she tries not to do very often. Has taken lexapro previously but switched to celexa as it wasn't helpful. Takes xanax sporadically - has been out recently - averaged taking about 3 times per week. Takes during the day - denies drowsiness. Taking ambien at night to help sleep. Has had counseling before - was helpful but hasn't been since 2015 b/c felt like it wasn't as helpful to her.   Review of Systems  Constitutional: Negative for activity change, appetite change, chills and fever.  HENT: Negative for  congestion, ear pain, sinus pressure, sinus pain and sore throat.   Eyes: Negative for discharge and visual disturbance.  Respiratory: Negative for cough, shortness of breath and wheezing.   Cardiovascular: Negative for chest pain and leg swelling.  Gastrointestinal: Negative for abdominal pain, blood in stool, constipation, diarrhea, nausea and vomiting.  Genitourinary: Negative for difficulty urinating, hematuria, pelvic pain, vaginal discharge and vaginal pain.  Neurological: Negative for dizziness, weakness, light-headedness and headaches.  Hematological: Negative for adenopathy.  Psychiatric/Behavioral: Negative for suicidal ideas.  All other systems reviewed and are negative.      Objective:   Physical Exam  Constitutional: She is oriented to person, place, and time. She appears well-developed and well-nourished. No distress.  HENT:  Head: Normocephalic and atraumatic.  Right Ear: Tympanic membrane normal.  Left Ear: Tympanic membrane normal.  Nose: Nose normal.  Mouth/Throat: Uvula is midline and oropharynx is clear and moist.  Eyes: Pupils are equal, round, and reactive to light. Conjunctivae are normal. Right eye exhibits no discharge. Left eye exhibits no discharge.  Neck: Neck supple. No thyromegaly present.  Cardiovascular: Normal rate, regular rhythm and normal heart sounds.  No murmur heard. Pulmonary/Chest: Effort normal and breath sounds normal. No respiratory distress. She has no wheezes. Right breast exhibits no inverted nipple, no mass, no nipple discharge, no skin change and no tenderness. Left breast exhibits no inverted nipple, no mass, no nipple discharge, no skin change and no tenderness.  Abdominal: Soft. Bowel sounds are normal.  She exhibits no distension and no mass. There is no tenderness.  Genitourinary:  Genitourinary Comments: GU exam deferred. Pap smear UTD.  Musculoskeletal: She exhibits no edema or deformity.  Lymphadenopathy:    She has no cervical  adenopathy.  Neurological: She is alert and oriented to person, place, and time. Coordination normal.  Skin: Skin is warm and dry.  Psychiatric: She has a normal mood and affect.  Nursing note and vitals reviewed.     Assessment & Plan:  1. Wellness examination - Plan: Lipid Profile, Hepatic function panel, Basic Metabolic Panel (BMET), Vitamin D (25 hydroxy), TSH, CBC with Differential Adult wellness-complete.wellness physical was conducted today. Importance of diet and exercise were discussed in detail.  In addition to this a discussion regarding safety was also covered. We also reviewed over immunizations and gave recommendations regarding current immunization needed for age.   -declined flu and Tdap vaccine today In addition to this additional areas were also touched on including: Preventative health exams needed:  Colonoscopy N/A: will need at age 47 Mammogram: due will order Pap Smear: UTD, due in 2021  Patient was advised yearly wellness exam. Pt concerned about her weight. Recommend improving her diet and increasing physical activity as tolerated. Offered nutrition consult - pt has seen nutritionist previously and declines at this visit. She will try to work on her diet, will f/u in 2-3 months to further discuss weight management options.   Pt also brought up her chronic pain concerns, but has not been seen in approx. 18 months, I do not recommend restarting of opioid pain medication if this can be avoided. She will f/u if her pain becomes worse and she desires to discuss further pain management options.  2. Anxiety - Plan: Ambulatory referral to Psychiatry Lengthy discussion with patient on her depression and anxiety symptoms. Reviewed her PHQ-9 and GAD-7 with patient. She is not suicidal. She would like to continue with celexa and occasional use of xanax. Recommend referral to mental health specialist to take over her management, recommend addition of counseling as well, seeing as  the celexa is not controlling her symptoms enough at this point.  Warning signs discussed, she is to seek care in ED if develops SI/HI. PMP database checked. Will refill xanax and Lorrin Mais, knows not to take these together. Pt should have enough refills on celexa to last until she sees psychiatry. Will have psychiatry take over management of celexa and xanax once she gets into see them.  3. Depression, unspecified depression type - Plan: Ambulatory referral to Psychiatry See #2 plan  4. Screening mammogram, encounter for - Plan: MM 3D SCREEN BREAST BILATERAL   Dr. Mickie Hillier was consulted on this case and is in agreement with the above treatment plan.  As attending physician to this patient visit, this patient was seen in conjunction with the nurse practitioner.  The history,physical and treatment plan was reviewed with the nurse practitioner and pertinent findings were verified with the patient.  Also the treatment plan was reviewed with the patient while they were present. WSLMD

## 2018-10-18 LAB — CBC WITH DIFFERENTIAL/PLATELET
Basophils Absolute: 0.1 10*3/uL (ref 0.0–0.2)
Basos: 1 %
EOS (ABSOLUTE): 0.1 10*3/uL (ref 0.0–0.4)
Eos: 1 %
Hematocrit: 39.6 % (ref 34.0–46.6)
Hemoglobin: 12.9 g/dL (ref 11.1–15.9)
IMMATURE GRANS (ABS): 0 10*3/uL (ref 0.0–0.1)
IMMATURE GRANULOCYTES: 1 %
LYMPHS: 23 %
Lymphocytes Absolute: 1.7 10*3/uL (ref 0.7–3.1)
MCH: 30.4 pg (ref 26.6–33.0)
MCHC: 32.6 g/dL (ref 31.5–35.7)
MCV: 93 fL (ref 79–97)
MONOS ABS: 0.6 10*3/uL (ref 0.1–0.9)
Monocytes: 8 %
NEUTROS PCT: 66 %
Neutrophils Absolute: 5.1 10*3/uL (ref 1.4–7.0)
PLATELETS: 333 10*3/uL (ref 150–450)
RBC: 4.24 x10E6/uL (ref 3.77–5.28)
RDW: 13.2 % (ref 12.3–15.4)
WBC: 7.6 10*3/uL (ref 3.4–10.8)

## 2018-10-18 LAB — VITAMIN D 25 HYDROXY (VIT D DEFICIENCY, FRACTURES): VIT D 25 HYDROXY: 13.6 ng/mL — AB (ref 30.0–100.0)

## 2018-10-18 LAB — HEPATIC FUNCTION PANEL
ALBUMIN: 4.2 g/dL (ref 3.5–5.5)
ALT: 8 IU/L (ref 0–32)
AST: 9 IU/L (ref 0–40)
Alkaline Phosphatase: 91 IU/L (ref 39–117)
BILIRUBIN TOTAL: 0.2 mg/dL (ref 0.0–1.2)
Bilirubin, Direct: 0.08 mg/dL (ref 0.00–0.40)
Total Protein: 6.7 g/dL (ref 6.0–8.5)

## 2018-10-18 LAB — LIPID PANEL
Chol/HDL Ratio: 4.6 ratio — ABNORMAL HIGH (ref 0.0–4.4)
Cholesterol, Total: 252 mg/dL — ABNORMAL HIGH (ref 100–199)
HDL: 55 mg/dL
LDL Calculated: 162 mg/dL — ABNORMAL HIGH (ref 0–99)
Triglycerides: 175 mg/dL — ABNORMAL HIGH (ref 0–149)
VLDL Cholesterol Cal: 35 mg/dL (ref 5–40)

## 2018-10-18 LAB — BASIC METABOLIC PANEL
BUN/Creatinine Ratio: 10 (ref 9–23)
BUN: 10 mg/dL (ref 6–24)
CALCIUM: 9 mg/dL (ref 8.7–10.2)
CO2: 25 mmol/L (ref 20–29)
CREATININE: 0.97 mg/dL (ref 0.57–1.00)
Chloride: 102 mmol/L (ref 96–106)
GFR calc Af Amer: 82 mL/min/{1.73_m2} (ref >59)
GFR calc non Af Amer: 71 mL/min/{1.73_m2} (ref >59)
Glucose: 100 mg/dL — ABNORMAL HIGH (ref 65–99)
Potassium: 4.3 mmol/L (ref 3.5–5.2)
Sodium: 141 mmol/L (ref 134–144)

## 2018-10-18 LAB — TSH: TSH: 2.23 u[IU]/mL (ref 0.450–4.500)

## 2018-11-23 ENCOUNTER — Ambulatory Visit: Payer: Self-pay

## 2018-12-11 ENCOUNTER — Other Ambulatory Visit: Payer: Self-pay | Admitting: Family Medicine

## 2018-12-11 MED ORDER — CYCLOBENZAPRINE HCL 10 MG PO TABS: ORAL_TABLET | ORAL | 0 refills | Status: DC

## 2018-12-19 ENCOUNTER — Telehealth: Payer: Self-pay | Admitting: Family Medicine

## 2018-12-19 MED ORDER — CYCLOBENZAPRINE HCL 10 MG PO TABS: ORAL_TABLET | ORAL | 0 refills | Status: DC

## 2018-12-19 NOTE — Telephone Encounter (Signed)
Yes I would say definitely can have 30 per mo

## 2018-12-19 NOTE — Telephone Encounter (Signed)
I called and let pt know that # 30 was sent in to last until her appt in Feb.

## 2018-12-19 NOTE — Telephone Encounter (Signed)
Please advise. Thank you

## 2018-12-19 NOTE — Telephone Encounter (Signed)
Can the nurse who allowed for just 7 let me know why we just called in 7?

## 2018-12-19 NOTE — Telephone Encounter (Signed)
Looking back in chart; I do not see what nurse sent this is. It looks like this was requested through [facility]. The last script was sent for 7 tablets.

## 2018-12-19 NOTE — Telephone Encounter (Signed)
Pt just picked up her cyclobenzaprine (FLEXERIL) 10 MG tablet. She states she can make the 7 tablets last until Feb 4th follow up. But would like to insure once she follows up she will be able to go back to the 30 tablet quantity.

## 2019-01-08 ENCOUNTER — Ambulatory Visit (INDEPENDENT_AMBULATORY_CARE_PROVIDER_SITE_OTHER): Payer: BLUE CROSS/BLUE SHIELD | Admitting: Family Medicine

## 2019-01-08 ENCOUNTER — Encounter: Payer: Self-pay | Admitting: Family Medicine

## 2019-01-08 VITALS — BP 122/80 | Ht 68.0 in | Wt 249.0 lb

## 2019-01-08 DIAGNOSIS — F321 Major depressive disorder, single episode, moderate: Secondary | ICD-10-CM

## 2019-01-08 DIAGNOSIS — M797 Fibromyalgia: Secondary | ICD-10-CM | POA: Diagnosis not present

## 2019-01-08 DIAGNOSIS — F419 Anxiety disorder, unspecified: Secondary | ICD-10-CM | POA: Diagnosis not present

## 2019-01-08 DIAGNOSIS — G4700 Insomnia, unspecified: Secondary | ICD-10-CM

## 2019-01-08 MED ORDER — CYCLOBENZAPRINE HCL 10 MG PO TABS: ORAL_TABLET | ORAL | 3 refills | Status: DC

## 2019-01-08 MED ORDER — CITALOPRAM HYDROBROMIDE 40 MG PO TABS: 40.0000 mg | ORAL_TABLET | Freq: Every day | ORAL | 0 refills | Status: DC

## 2019-01-08 MED ORDER — ALPRAZOLAM 1 MG PO TABS: ORAL_TABLET | ORAL | 0 refills | Status: DC

## 2019-01-08 NOTE — Progress Notes (Signed)
Subjective:    Patient ID: Barbara Yates, female    DOB: 1971/01/07, 48 y.o.   MRN: [id number]  HPI med check up.   No problems or concerns today.   Needs refill on ambien. States this was never refilled at her last visit, though it appears it was refilled via Epic, will check with her pharmacy to see what is on file for her. Denies any adverse effects from [place]. States since she has been out she has been taking tylenol PM.   States she does not take hydrocodone anymore. Takes 8 tylenol a day for pain. Has pain all over. Reports taking flexeril and tylenol for pain. Offered refill of naproxen. States tylenol works better than NSAIDs. Offered referral to pain management specialist, pt declined.   Pt was previously referred to psychiatry to take over her mental health treatment, she is refusing to go see them. States she is doing fine on her current medications and would like to continue them. She denies SI/HI. Denies ever having any suicidal plans. She knows not to take xanax and [place] together.  Review of Systems  Constitutional: Negative for fever and unexpected weight change.  Respiratory: Negative for shortness of breath.   Cardiovascular: Negative for chest pain.  Psychiatric/Behavioral: Negative for suicidal ideas.       Objective:   Physical Exam Vitals signs and nursing note reviewed.  Constitutional:      General: She is not in acute distress.    Appearance: Normal appearance. She is well-developed. She is not toxic-appearing.  HENT:     Head: Normocephalic and atraumatic.  Neck:     Musculoskeletal: Neck supple.  Cardiovascular:     Rate and Rhythm: Normal rate and regular rhythm.     Heart sounds: Normal heart sounds. No murmur.  Pulmonary:     Effort: Pulmonary effort is normal. No respiratory distress.     Breath sounds: Normal breath sounds.  Skin:    General: Skin is warm and dry.  Neurological:     Mental Status: She is alert and oriented to person,  place, and time.  Psychiatric:        Attention and Perception: Attention normal.        Speech: Speech normal.        Behavior: Behavior is agitated.        Thought Content: Thought content normal.    Depression screen [facility] 2/9 01/08/2019 10/10/2018 01/11/2017  Decreased Interest 2 3 3   Down, Depressed, Hopeless 1 2 3   PHQ - 2 Score 3 5 6   Altered sleeping 3 3 -  Tired, decreased energy 1 3 -  Change in appetite 1 3 -  Feeling bad or failure about yourself  0 2 -  Trouble concentrating 0 1 -  Moving slowly or fidgety/restless 0 2 -  Suicidal thoughts 1 1 -  PHQ-9 Score 9 20 -  Difficult doing work/chores Somewhat difficult Very difficult -   GAD 7 : Generalized Anxiety Score 01/08/2019 10/10/2018  Nervous, Anxious, on Edge 2 3  Control/stop worrying 1 2  Worry too much - different things 1 2  Trouble relaxing 1 3  Restless 0 1  Easily annoyed or irritable 2 3  Afraid - awful might happen 1 1  Total GAD 7 Score 8 15  Anxiety Difficulty Somewhat difficult Very difficult           Assessment & Plan:  1. Insomnia, unspecified type Patient denies any adverse effects from Ambien.  Patient's Ambien was sent into the pharmacy with 5 refills at her last visit in November 2019.  Called pharmacy on file and they report they have refills available on the prescription that patient never filled.  2. Fibromyalgia Patient reports continued pain all over.  She has not been on opioid pain medication in over 18 months.  Do not feel it is in her best interest to restart this.  May continue Tylenol and Flexeril as needed.  Patient was offered referral to pain management clinic.  She declines referral offer stating she is already been given a referral previously and does not want to go to them.  3. Current moderate episode of major depressive disorder without prior episode [facility]) Patient reports her depression is controlled with Celexa.  She denies suicidal ideation or plan.  Discussed with her that  I feel it is in her best interest that these be managed by specialist.  She is refusing to seek treatment from psychiatrist despite referral being placed at last visit.  She states she will not benefit from counseling.  Strongly encouraged her to follow-up with psychiatrist.  25-month supply of Celexa given.  4. Anxiety Patient reports well-controlled with occasional use of Xanax.  PMP database checked.  Ambien refilled once.  Again recommend that she be seen by psychiatry for further management.  Dr. Mickie Hillier was consulted on this case and is in agreement with the above treatment plan.

## 2019-01-10 ENCOUNTER — Encounter: Payer: Self-pay | Admitting: Family Medicine

## 2019-01-15 ENCOUNTER — Encounter: Payer: Self-pay | Admitting: Family Medicine

## 2019-01-15 ENCOUNTER — Encounter (INDEPENDENT_AMBULATORY_CARE_PROVIDER_SITE_OTHER): Payer: BLUE CROSS/BLUE SHIELD

## 2019-01-29 ENCOUNTER — Ambulatory Visit (INDEPENDENT_AMBULATORY_CARE_PROVIDER_SITE_OTHER): Payer: BLUE CROSS/BLUE SHIELD | Admitting: Bariatrics

## 2019-02-12 ENCOUNTER — Ambulatory Visit (INDEPENDENT_AMBULATORY_CARE_PROVIDER_SITE_OTHER): Payer: BLUE CROSS/BLUE SHIELD | Admitting: Bariatrics

## 2019-02-28 ENCOUNTER — Ambulatory Visit: Payer: Self-pay | Admitting: Nurse Practitioner

## 2019-03-26 ENCOUNTER — Ambulatory Visit: Payer: Self-pay | Admitting: Family Medicine

## 2019-03-27 ENCOUNTER — Ambulatory Visit (INDEPENDENT_AMBULATORY_CARE_PROVIDER_SITE_OTHER): Payer: Self-pay | Admitting: Internal Medicine

## 2019-03-27 ENCOUNTER — Encounter: Payer: Self-pay | Admitting: Internal Medicine

## 2019-03-27 ENCOUNTER — Other Ambulatory Visit: Payer: Self-pay

## 2019-03-27 DIAGNOSIS — F419 Anxiety disorder, unspecified: Secondary | ICD-10-CM

## 2019-03-27 DIAGNOSIS — M545 Low back pain, unspecified: Secondary | ICD-10-CM

## 2019-03-27 DIAGNOSIS — Z1239 Encounter for other screening for malignant neoplasm of breast: Secondary | ICD-10-CM

## 2019-03-27 DIAGNOSIS — G8929 Other chronic pain: Secondary | ICD-10-CM

## 2019-03-27 DIAGNOSIS — G4700 Insomnia, unspecified: Secondary | ICD-10-CM

## 2019-03-27 DIAGNOSIS — E559 Vitamin D deficiency, unspecified: Secondary | ICD-10-CM

## 2019-03-27 DIAGNOSIS — A6000 Herpesviral infection of urogenital system, unspecified: Secondary | ICD-10-CM | POA: Insufficient documentation

## 2019-03-27 DIAGNOSIS — F321 Major depressive disorder, single episode, moderate: Secondary | ICD-10-CM

## 2019-03-27 DIAGNOSIS — M797 Fibromyalgia: Secondary | ICD-10-CM

## 2019-03-27 NOTE — Progress Notes (Signed)
Virtual Visit via Video Note  I connected with Barbara Yates on 03/27/19 at  9:30 AM EDT by a video enabled telemedicine application and verified that I am speaking with the correct person using two identifiers.  Location patient: home Location provider: work office Persons participating in the virtual visit: patient, provider  I discussed the limitations of evaluation and management by telemedicine and the availability of in person appointments. The patient expressed understanding and agreed to proceed.   HPI: This visit is to establish care and to follow up on chronic conditions.  PMH is significant for:  1. Depression with stable mood on Celexa, has never seen a psychiatrist or had CBT sessions.  2. Social anxiety. When presenting in front of large crowds for which she uses Xanax PRN. A Rx for 30 tabs will usually last her 4-6 months.  3. Insomnia. Uses ambien infrequently, xanax did not work for sleep.  4. Morbid Obesity  5. Vit D Def. Levels have not been checked in a while. Takes OTC supplementation, not sure of dose.  6. Fibromyalgia/Myofascial Pain Sx. Used to be on hydrocodone but no longer.  7. Genital Herpes for which she uses suppressive therapy with Valtrex 1000mg  daily. Despite this, she gets 1-2 outbreaks per year.  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 Lives in [place]. Used to smoke but quit in 1999. Drinks 3-4 shots of tequila per week.  Fam Hcx: PGM with HTN, CVA and Alzeimer's Dementia; MGF with pancreatic cancer, both deceased.  She has routine eye and dental. Has had a colposcopy in th 90s, PAPs since then have been normal. Next Pap due in 2/21. Needs a mammogram.  Due for CPE in Nov 2020.   ROS: Constitutional: Denies fever, chills, diaphoresis, appetite change and fatigue.  HEENT: Denies photophobia, eye pain, redness, hearing loss, ear pain, congestion, sore throat, rhinorrhea, sneezing, mouth sores, trouble  swallowing, neck pain, neck stiffness and tinnitus.   Respiratory: Denies SOB, DOE, cough, chest tightness,  and wheezing.   Cardiovascular: Denies chest pain, palpitations and leg swelling.  Gastrointestinal: Denies nausea, vomiting, abdominal pain, diarrhea, constipation, blood in stool and abdominal distention.  Genitourinary: Denies dysuria, urgency, frequency, hematuria, flank pain and difficulty urinating.  Endocrine: Denies: hot or cold intolerance, sweats, changes in hair or nails, polyuria, polydipsia. Musculoskeletal: Denies myalgias, back pain, joint swelling, arthralgias and gait problem.  Skin: Denies pallor, rash and wound.  Neurological: Denies dizziness, seizures, syncope, weakness, light-headedness, numbness and headaches.  Hematological: Denies adenopathy. Easy bruising, personal or family bleeding history  Psychiatric/Behavioral: Denies suicidal ideation, mood changes, confusion, nervousness, sleep disturbance and agitation   Past Medical History:  Diagnosis Date  . Anxiety   . Chronic back pain   . Depression   . Hyperlipidemia   . Insomnia   . Migraine headache   . Myofascial pain     Past Surgical History:  Procedure Laterality Date  . TUBAL LIGATION      Family History  Problem Relation Age of Onset  . Hypertension Paternal Grandmother   . CVA Paternal Grandmother   . Alzheimer's disease Paternal Grandmother   . Cancer Paternal Grandfather        Pancreatic  . Pancreatic cancer Maternal Grandfather     SOCIAL HX:   reports that she quit smoking about 20 years ago. Her smoking use included cigarettes. She started smoking about 23 years ago. She has a 0.75 pack-year smoking history. She  has never used smokeless tobacco. She reports current alcohol use of about 16.0 standard drinks of alcohol per week. She reports that she does not use drugs.   Current Outpatient Medications:  .  ALPRAZolam (XANAX) 1 MG tablet, Take 1 tablet po daily prn anxiety, Disp:  30 tablet, Rfl: 0 .  citalopram (CELEXA) 40 MG tablet, Take 1 tablet (40 mg total) by mouth daily., Disp: 90 tablet, Rfl: 0 .  cyclobenzaprine (FLEXERIL) 10 MG tablet, TAKE 1 TABLET BY MOUTH AT BEDTIME AS NEEDED FOR SPASMS, Disp: 30 tablet, Rfl: 3 .  valACYclovir (VALTREX) 1000 MG tablet, TAKE 1 TABLET (1,000 MG TOTAL) BY MOUTH DAILY., Disp: 30 tablet, Rfl: 11 .  zolpidem (AMBIEN) 10 MG tablet, TAKE 1 TABLET BY MOUTH AT BEDTIME AS NEEDED FOR SLEEP *QUANTITY 15 PER INSURANCE*, Disp: 30 tablet, Rfl: 5  EXAM:   VITALS per patient if applicable: none reported  GENERAL: alert, oriented, appears well and in no acute distress  HEENT: atraumatic, conjunttiva clear, no obvious abnormalities on inspection of external nose and ears  NECK: normal movements of the head and neck  LUNGS: on inspection no signs of respiratory distress, breathing rate appears normal, no obvious gross increased work of breathing, gasping or wheezing  CV: no obvious cyanosis  MS: moves all visible extremities without noticeable abnormality  PSYCH/NEURO: pleasant and cooperative, no obvious depression or anxiety, speech and thought processing grossly intact  ASSESSMENT AND PLAN:   Current moderate episode of major depressive disorder without prior episode (HCC) -Mood is well controlled on Celexa 40 mg daily. -Does not see psych or attend CBT sessions. -No SI.  Insomnia, unspecified type -With infrequent ambien use.  Morbid obesity due to excess calories ([place]) -We have discussed a healthy lifestyle, this is something that she has struggled with all her adult life.  Fibromyalgia -Not currently on any meds  Chronic low back pain without sciatica, unspecified back pain laterality -Fairly well controlled, not currently on meds.  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 -She takes on average maybe 1-2 xanax tabs per week.  Vitamin D  deficiency -Need to recheck levels next CPE.  Genital herpes simplex, unspecified site -Continue suppressive Valtrex therapy.     I discussed the assessment and treatment plan with the patient. The patient was provided an opportunity to ask questions and all were answered. The patient agreed with the plan and demonstrated an understanding of the instructions.   The patient was advised to call back or seek an in-person evaluation if the symptoms worsen or if the condition fails to improve as anticipated.    Lelon Frohlich, MD  [facility] at [facility]

## 2019-03-28 ENCOUNTER — Ambulatory Visit: Payer: BLUE CROSS/BLUE SHIELD | Admitting: Family Medicine

## 2019-05-01 ENCOUNTER — Other Ambulatory Visit: Payer: Self-pay | Admitting: Family Medicine

## 2019-05-29 ENCOUNTER — Telehealth: Payer: Self-pay | Admitting: Internal Medicine

## 2019-05-29 MED ORDER — CITALOPRAM HYDROBROMIDE 40 MG PO TABS: 40.0000 mg | ORAL_TABLET | Freq: Every day | ORAL | 1 refills | Status: DC

## 2019-05-29 NOTE — Telephone Encounter (Signed)
Refill sent.

## 2019-05-29 NOTE — Addendum Note (Signed)
Addended by: Westley Hummer B on: 05/29/2019 03:56 PM   Modules accepted: Orders

## 2019-05-29 NOTE — Telephone Encounter (Signed)
Patient calling in stating she was advised by pharmacy to give office a call to get medication approved for refill. Please advise and call back.

## 2019-06-10 ENCOUNTER — Ambulatory Visit: Payer: Self-pay

## 2019-06-10 ENCOUNTER — Other Ambulatory Visit: Payer: Self-pay | Admitting: Family Medicine

## 2019-06-21 ENCOUNTER — Telehealth: Payer: Self-pay | Admitting: Internal Medicine

## 2019-06-21 NOTE — Telephone Encounter (Signed)
Pt is requesting a refill for valACYclovir HCl 1000 MG   Pharmacy:  [id number] - [place], [place] [phone] (Phone) [phone] (Fax)

## 2019-06-25 ENCOUNTER — Ambulatory Visit (INDEPENDENT_AMBULATORY_CARE_PROVIDER_SITE_OTHER): Payer: Self-pay | Admitting: Internal Medicine

## 2019-06-25 ENCOUNTER — Other Ambulatory Visit: Payer: Self-pay

## 2019-06-25 DIAGNOSIS — F321 Major depressive disorder, single episode, moderate: Secondary | ICD-10-CM

## 2019-06-25 DIAGNOSIS — A6000 Herpesviral infection of urogenital system, unspecified: Secondary | ICD-10-CM

## 2019-06-25 DIAGNOSIS — E559 Vitamin D deficiency, unspecified: Secondary | ICD-10-CM

## 2019-06-25 DIAGNOSIS — M797 Fibromyalgia: Secondary | ICD-10-CM

## 2019-06-25 DIAGNOSIS — G4700 Insomnia, unspecified: Secondary | ICD-10-CM

## 2019-06-25 DIAGNOSIS — G8929 Other chronic pain: Secondary | ICD-10-CM

## 2019-06-25 DIAGNOSIS — F419 Anxiety disorder, unspecified: Secondary | ICD-10-CM

## 2019-06-25 DIAGNOSIS — M545 Low back pain, unspecified: Secondary | ICD-10-CM

## 2019-06-25 DIAGNOSIS — M7918 Myalgia, other site: Secondary | ICD-10-CM

## 2019-06-25 MED ORDER — VALACYCLOVIR HCL 1 G PO TABS: ORAL_TABLET | ORAL | 11 refills | Status: DC

## 2019-06-25 NOTE — Telephone Encounter (Signed)
Already done

## 2019-06-25 NOTE — Progress Notes (Signed)
Virtual Visit via Video Note  I connected with Barbara Yates on 06/25/19 at  8:00 AM EDT by a video enabled telemedicine application and verified that I am speaking with the correct person using two identifiers.  Location patient: home Location provider: work office Persons participating in the virtual visit: patient, provider  I discussed the limitations of evaluation and management by telemedicine and the availability of in person appointments. The patient expressed understanding and agreed to proceed.   HPI: This is a scheduled visit to follow up on chronic medical conditions.  PMH is significant for:  1. Depression with stable mood on Celexa, has never seen a psychiatrist or had CBT sessions.  2. Social anxiety. When presenting in front of large crowds for which she uses Xanax PRN. A Rx for 30 tabs will usually last her 4-6 months.  3. Insomnia. Uses ambien infrequently, xanax did not work for sleep.  4. Morbid Obesity  5. Vit D Def. Levels have not been checked in a while. Takes OTC supplementation, not sure of dose.  6. Fibromyalgia/Myofascial Pain Sx. Used to be on hydrocodone but no longer.  7. Genital Herpes for which she uses suppressive therapy with Valtrex 1000mg  daily. Despite this, she gets 1-2 outbreaks per year.  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 Used to smoke but quit in 1999. Drinks 3-4 shots of tequila per week.  Fam Hcx: PGM with HTN, CVA and Alzeimer's Dementia; MGF with pancreatic cancer, both deceased.  She has routine eye and dental. Has had a colposcopy in th 90s, PAPs since then have been normal. Next Pap due in 2/21. Needs a mammogram.  Due for CPE in Jan 2021.  She needs valtrex refills today. Has been having some increased lower back pain in the last few weeks. She knows this is from bad posture and working at home without proper ergonomic equipment. She will see if her insurance plan  will reimburse her for an office chair.   ROS: Constitutional: Denies fever, chills, diaphoresis, appetite change and fatigue.  HEENT: Denies photophobia, eye pain, redness, hearing loss, ear pain, congestion, sore throat, rhinorrhea, sneezing, mouth sores, trouble swallowing, neck pain, neck stiffness and tinnitus.   Respiratory: Denies SOB, DOE, cough, chest tightness,  and wheezing.   Cardiovascular: Denies chest pain, palpitations and leg swelling.  Gastrointestinal: Denies nausea, vomiting, abdominal pain, diarrhea, constipation, blood in stool and abdominal distention.  Genitourinary: Denies dysuria, urgency, frequency, hematuria, flank pain and difficulty urinating.  Endocrine: Denies: hot or cold intolerance, sweats, changes in hair or nails, polyuria, polydipsia. Musculoskeletal: Denies myalgias, back pain, joint swelling, arthralgias and gait problem.  Skin: Denies pallor, rash and wound.  Neurological: Denies dizziness, seizures, syncope, weakness, light-headedness, numbness and headaches.  Hematological: Denies adenopathy. Easy bruising, personal or family bleeding history  Psychiatric/Behavioral: Denies suicidal ideation, mood changes, confusion, nervousness, sleep disturbance and agitation   Past Medical History:  Diagnosis Date  . Anxiety   . Chronic back pain   . Depression   . Hyperlipidemia   . Insomnia   . Migraine headache   . Myofascial pain     Past Surgical History:  Procedure Laterality Date  . TUBAL LIGATION      Family History  Problem Relation Age of Onset  . Hypertension Paternal Grandmother   . CVA Paternal Grandmother   . Alzheimer's disease Paternal Grandmother   . Cancer Paternal Grandfather  Pancreatic  . Pancreatic cancer Maternal Grandfather     SOCIAL HX:   reports that she quit smoking about 20 years ago. Her smoking use included cigarettes. She started smoking about 23 years ago. She has a 0.75 pack-year smoking history. She has  never used smokeless tobacco. She reports current alcohol use of about 4.0 standard drinks of alcohol per week. She reports that she does not use drugs.   Current Outpatient Medications:  .  ALPRAZolam (XANAX) 1 MG tablet, Take 1 tablet po daily prn anxiety, Disp: 30 tablet, Rfl: 0 .  citalopram (CELEXA) 40 MG tablet, Take 1 tablet (40 mg total) by mouth daily., Disp: 90 tablet, Rfl: 1 .  cyclobenzaprine (FLEXERIL) 10 MG tablet, TAKE 1 TABLET BY MOUTH AT BEDTIME AS NEEDED FOR SPASMS, Disp: 30 tablet, Rfl: 3 .  valACYclovir (VALTREX) 1000 MG tablet, TAKE 1 TABLET (1,000 MG TOTAL) BY MOUTH DAILY., Disp: 30 tablet, Rfl: 11 .  zolpidem (AMBIEN) 10 MG tablet, TAKE 1 TABLET BY MOUTH AT BEDTIME AS NEEDED FOR SLEEP *QUANTITY 15 PER INSURANCE*, Disp: 30 tablet, Rfl: 5  EXAM:   VITALS per patient if applicable: none reported  GENERAL: alert, oriented, appears well and in no acute distress  HEENT: atraumatic, conjunttiva clear, no obvious abnormalities on inspection of external nose and ears, wears corrective lenses  NECK: normal movements of the head and neck  LUNGS: on inspection no signs of respiratory distress, breathing rate appears normal, no obvious gross increased work of breathing, gasping or wheezing  CV: no obvious cyanosis  MS: moves all visible extremities without noticeable abnormality  PSYCH/NEURO: pleasant and cooperative, no obvious depression or anxiety, speech and thought processing grossly intact  ASSESSMENT AND PLAN:  Current moderate episode of major depressive disorder without prior episode (HCC) -Mood is well controlled on Celexa 40 mg daily. -Does not see psych or attend CBT sessions. -No SI.  Insomnia, unspecified type -With infrequent ambien use.  Morbid obesity due to excess calories ([place]) -We have discussed a healthy lifestyle, this is something that she has struggled with all her adult life.  Fibromyalgia -Not currently on any meds  Chronic low back  pain without sciatica, unspecified back pain laterality -Fairly well controlled, not currently on meds. -I am happy to fill our office chair forms.  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 -She takes on average maybe 1-2 xanax tabs per week; less now due to COVID-19 pandemic.  Vitamin D deficiency -Need to recheck levels next CPE.  Genital herpes simplex, unspecified site -Continue suppressive Valtrex therapy. -Refills requested today.     I discussed the assessment and treatment plan with the patient. The patient was provided an opportunity to ask questions and all were answered. The patient agreed with the plan and demonstrated an understanding of the instructions.   The patient was advised to call back or seek an in-person evaluation if the symptoms worsen or if the condition fails to improve as anticipated.    Lelon Frohlich, MD  [facility] at [facility]

## 2019-07-03 ENCOUNTER — Telehealth: Payer: Self-pay | Admitting: *Deleted

## 2019-07-03 NOTE — Telephone Encounter (Signed)
Left message on machine for patient to schedule a CPE

## 2019-07-09 ENCOUNTER — Other Ambulatory Visit: Payer: Self-pay | Admitting: Internal Medicine

## 2019-07-09 ENCOUNTER — Telehealth: Payer: Self-pay | Admitting: Internal Medicine

## 2019-07-09 ENCOUNTER — Ambulatory Visit: Payer: BLUE CROSS/BLUE SHIELD | Admitting: Family Medicine

## 2019-07-09 DIAGNOSIS — G4700 Insomnia, unspecified: Secondary | ICD-10-CM

## 2019-07-09 MED ORDER — ZOLPIDEM TARTRATE 10 MG PO TABS: ORAL_TABLET | ORAL | 2 refills | Status: DC

## 2019-07-09 NOTE — Telephone Encounter (Signed)
Medication Refill - Medication: zolpidem (AMBIEN) 10 MG tablet   Has the patient contacted their pharmacy? Yes (Agent: If no, request that the patient contact the pharmacy for the refill.) (Agent: If yes, when and what did the pharmacy advise?)Contact PCP  Preferred Pharmacy (with phone number or street name):  [id number] [place], [place] [phone] (Phone) [phone] (Fax)     Agent: Please be advised that RX refills may take up to 3 business days. We ask that you follow-up with your pharmacy.

## 2019-07-23 ENCOUNTER — Other Ambulatory Visit: Payer: Self-pay

## 2019-07-23 ENCOUNTER — Ambulatory Visit
Admission: RE | Admit: 2019-07-23 | Discharge: 2019-07-23 | Disposition: A | Discharge status: Home / Self Care | Payer: BC Managed Care – PPO | Source: Ambulatory Visit | Attending: Internal Medicine | Admitting: Internal Medicine

## 2019-07-23 DIAGNOSIS — Z1239 Encounter for other screening for malignant neoplasm of breast: Secondary | ICD-10-CM

## 2019-07-23 DIAGNOSIS — Z1231 Encounter for screening mammogram for malignant neoplasm of breast: Secondary | ICD-10-CM | POA: Diagnosis not present

## 2019-07-24 ENCOUNTER — Other Ambulatory Visit: Payer: Self-pay | Admitting: Internal Medicine

## 2019-07-24 DIAGNOSIS — R928 Other abnormal and inconclusive findings on diagnostic imaging of breast: Secondary | ICD-10-CM

## 2019-07-26 ENCOUNTER — Other Ambulatory Visit: Payer: Self-pay | Admitting: Internal Medicine

## 2019-07-26 ENCOUNTER — Other Ambulatory Visit: Payer: Self-pay

## 2019-07-26 ENCOUNTER — Ambulatory Visit
Admission: RE | Admit: 2019-07-26 | Discharge: 2019-07-26 | Disposition: A | Discharge status: Home / Self Care | Payer: BC Managed Care – PPO | Source: Ambulatory Visit | Attending: Internal Medicine | Admitting: Internal Medicine

## 2019-07-26 DIAGNOSIS — R928 Other abnormal and inconclusive findings on diagnostic imaging of breast: Secondary | ICD-10-CM | POA: Diagnosis not present

## 2019-07-26 DIAGNOSIS — N6314 Unspecified lump in the right breast, lower inner quadrant: Secondary | ICD-10-CM | POA: Diagnosis not present

## 2019-07-26 DIAGNOSIS — N6001 Solitary cyst of right breast: Secondary | ICD-10-CM

## 2019-08-20 ENCOUNTER — Other Ambulatory Visit: Payer: Self-pay | Admitting: Internal Medicine

## 2019-09-06 ENCOUNTER — Telehealth: Payer: Self-pay | Admitting: Internal Medicine

## 2019-09-06 ENCOUNTER — Other Ambulatory Visit: Payer: Self-pay | Admitting: Internal Medicine

## 2019-09-06 DIAGNOSIS — F419 Anxiety disorder, unspecified: Secondary | ICD-10-CM

## 2019-09-06 MED ORDER — ALPRAZOLAM 1 MG PO TABS: ORAL_TABLET | ORAL | 0 refills | Status: DC

## 2019-09-06 NOTE — Telephone Encounter (Signed)
Sent!

## 2019-09-06 NOTE — Telephone Encounter (Signed)
Medication Refill - Medication: ALPRAZolam (XANAX) 1 MG tablet Has not been filled by Dr. Jerilee Hoh yet.  Has the patient contacted their pharmacy? Yes.   (Agent: If no, request that the patient contact the pharmacy for the refill.) (Agent: If yes, when and what did the pharmacy advise?)  Preferred Pharmacy (with phone number or street name):  [id number] [place], [place] [phone] (Phone) [phone] (Fax)     Agent: Please be advised that RX refills may take up to 3 business days. We ask that you follow-up with your pharmacy.

## 2019-11-18 ENCOUNTER — Other Ambulatory Visit: Payer: Self-pay | Admitting: Internal Medicine

## 2019-11-18 DIAGNOSIS — F419 Anxiety disorder, unspecified: Secondary | ICD-10-CM

## 2019-11-18 NOTE — Telephone Encounter (Signed)
Last rx given on 10/2 for #30 with no ref

## 2019-11-23 ENCOUNTER — Other Ambulatory Visit: Payer: Self-pay | Admitting: Internal Medicine

## 2019-12-04 ENCOUNTER — Other Ambulatory Visit: Payer: Self-pay | Admitting: Internal Medicine

## 2019-12-04 NOTE — Telephone Encounter (Signed)
Copied from [place] [id number]. Topic: Quick Communication - Rx Refill/Question >> Dec 04, 2019 12:42 PM Leward Quan A wrote: Medication: citalopram (CELEXA) 40 MG tablet   Has the patient contacted their pharmacy? Yes.   (Agent: If no, request that the patient contact the pharmacy for the refill.) (Agent: If yes, when and what did the pharmacy advise?)  Preferred Pharmacy (with phone number or street name): [id number] - [place], [place]  Phone:  [phone] Fax:  [phone]     Agent: Please be advised that RX refills may take up to 3 business days. We ask that you follow-up with your pharmacy.

## 2019-12-19 ENCOUNTER — Other Ambulatory Visit: Payer: Self-pay | Admitting: Internal Medicine

## 2019-12-31 ENCOUNTER — Encounter: Payer: Self-pay | Admitting: Internal Medicine

## 2019-12-31 ENCOUNTER — Other Ambulatory Visit: Payer: Self-pay

## 2019-12-31 ENCOUNTER — Ambulatory Visit (INDEPENDENT_AMBULATORY_CARE_PROVIDER_SITE_OTHER): Payer: BC Managed Care – PPO | Admitting: Internal Medicine

## 2019-12-31 ENCOUNTER — Other Ambulatory Visit (HOSPITAL_COMMUNITY)
Admission: RE | Admit: 2019-12-31 | Discharge: 2019-12-31 | Disposition: A | Discharge status: Home / Self Care | Payer: BC Managed Care – PPO | Source: Ambulatory Visit | Attending: Internal Medicine | Admitting: Internal Medicine

## 2019-12-31 VITALS — BP 120/84 | HR 86 | Temp 97.5°F | Ht 69.0 in | Wt 245.5 lb

## 2019-12-31 DIAGNOSIS — Z124 Encounter for screening for malignant neoplasm of cervix: Secondary | ICD-10-CM | POA: Insufficient documentation

## 2019-12-31 DIAGNOSIS — Z1211 Encounter for screening for malignant neoplasm of colon: Secondary | ICD-10-CM

## 2019-12-31 DIAGNOSIS — E559 Vitamin D deficiency, unspecified: Secondary | ICD-10-CM | POA: Diagnosis not present

## 2019-12-31 DIAGNOSIS — F321 Major depressive disorder, single episode, moderate: Secondary | ICD-10-CM | POA: Diagnosis not present

## 2019-12-31 DIAGNOSIS — Z0000 Encounter for general adult medical examination without abnormal findings: Secondary | ICD-10-CM

## 2019-12-31 DIAGNOSIS — E669 Obesity, unspecified: Secondary | ICD-10-CM

## 2019-12-31 DIAGNOSIS — R011 Cardiac murmur, unspecified: Secondary | ICD-10-CM

## 2019-12-31 DIAGNOSIS — G4700 Insomnia, unspecified: Secondary | ICD-10-CM | POA: Diagnosis not present

## 2019-12-31 DIAGNOSIS — F419 Anxiety disorder, unspecified: Secondary | ICD-10-CM

## 2019-12-31 NOTE — Progress Notes (Signed)
Established Patient Office Visit     This visit occurred during the SARS-CoV-2 public health emergency.  Safety protocols were in place, including screening questions prior to the visit, additional usage of staff PPE, and extensive cleaning of exam room while observing appropriate contact time as indicated for disinfecting solutions.    CC/Reason for Visit: Annual preventive exam  HPI: Barbara Yates is a 49 y.o. female who is coming in today for the above mentioned reasons. Past Medical History is significant for:   1. Depression with stable mood on Celexa, has never seen a psychiatrist or had CBT sessions.  Needs refills today.  2. Social anxiety. When presenting in front of large crowds for which she uses Xanax PRN. A Rx for 30 tabs will usually last her 4-6 months.  3. Insomnia. Uses ambien infrequently, xanax did not work for sleep.  4.  Obesity with a BMI of 36  5. Vit D Def. Levels have not been checked in a while. Takes OTC supplementation, not sure of dose.  6. Fibromyalgia/Myofascial Pain Sx. Used to be on hydrocodone but no longer.  7. Genital Herpes for which she uses suppressive therapy with Valtrex 1038m daily. Despite this, she gets 1-2 outbreaks per year.    Past Medical/Surgical History: Past Medical History:  Diagnosis Date  . Anxiety   . Chronic back pain   . Depression   . Hyperlipidemia   . Insomnia   . Migraine headache   . Myofascial pain     Past Surgical History:  Procedure Laterality Date  . TUBAL LIGATION      Social History:  reports that she quit smoking about 21 years ago. Her smoking use included cigarettes. She started smoking about 24 years ago. She has a 0.75 pack-year smoking history. She has never used smokeless tobacco. She reports current alcohol use of about 4.0 standard drinks of alcohol per week. She reports that she does not use drugs.  Allergies: No Known Allergies  Family History:  Family History    Problem Relation Age of Onset  . Hypertension Paternal Grandmother   . CVA Paternal Grandmother   . Alzheimer's disease Paternal Grandmother   . Cancer Paternal Grandfather        Pancreatic  . Pancreatic cancer Maternal Grandfather      Current Outpatient Medications:  .  ALPRAZolam (XANAX) 1 MG tablet, Take 1 tablet po daily prn anxiety, Disp: 30 tablet, Rfl: 0 .  citalopram (CELEXA) 40 MG tablet, Take 1 tablet (40 mg total) by mouth daily., Disp: 90 tablet, Rfl: 1 .  cyclobenzaprine (FLEXERIL) 10 MG tablet, TAKE 1 TABLET BY MOUTH AT BEDTIME AS NEEDED FOR SPASMS, Disp: 30 tablet, Rfl: 3 .  valACYclovir (VALTREX) 1000 MG tablet, TAKE 1 TABLET (1,000 MG TOTAL) BY MOUTH DAILY., Disp: 30 tablet, Rfl: 11 .  zolpidem (AMBIEN) 10 MG tablet, TAKE 1 TABLET BY MOUTH AT BEDTIME AS NEEDED FOR SLEEP *QUANTITY 15 PER INSURANCE*, Disp: 15 tablet, Rfl: 2  Review of Systems:  Constitutional: Denies fever, chills, diaphoresis, appetite change and fatigue.  HEENT: Denies photophobia, eye pain, redness, hearing loss, ear pain, congestion, sore throat, rhinorrhea, sneezing, mouth sores, trouble swallowing, neck pain, neck stiffness and tinnitus.   Respiratory: Denies SOB, DOE, cough, chest tightness,  and wheezing.   Cardiovascular: Denies chest pain, palpitations and leg swelling.  Gastrointestinal: Denies nausea, vomiting, abdominal pain, diarrhea, constipation, blood in stool and abdominal distention.  Genitourinary: Denies dysuria, urgency, frequency, hematuria, flank pain  and difficulty urinating.  Endocrine: Denies: hot or cold intolerance, sweats, changes in hair or nails, polyuria, polydipsia. Musculoskeletal: Denies myalgias, back pain, joint swelling, arthralgias and gait problem.  Skin: Denies pallor, rash and wound.  Neurological: Denies dizziness, seizures, syncope, weakness, light-headedness, numbness and headaches.  Hematological: Denies adenopathy. Easy bruising, personal or family  bleeding history  Psychiatric/Behavioral: Denies suicidal ideation, mood changes, confusion, nervousness, sleep disturbance and agitation    Physical Exam: Vitals:   12/31/19 0900  BP: 120/84  Pulse: 86  Temp: (!) 97.5 F (36.4 C)  TempSrc: Temporal  SpO2: 97%  Weight: 245 lb 8 oz (111.4 kg)  Height: 5' 9"  (1.753 m)    Body mass index is 36.25 kg/m.   Constitutional: NAD, calm, comfortable Eyes: PERRL, lids and conjunctivae normal ENMT: Mucous membranes are moist.Tympanic membrane is pearly white, no erythema or bulging. Neck: normal, supple, no masses, no thyromegaly Respiratory: clear to auscultation bilaterally, no wheezing, no crackles. Normal respiratory effort. No accessory muscle use.  Cardiovascular: Regular rate and rhythm, flow murmur present, no rubs or gallops. No extremity edema. 2+ pedal pulses.   Abdomen: no tenderness, no masses palpated. No hepatosplenomegaly. Bowel sounds positive.  Musculoskeletal: no clubbing / cyanosis. No joint deformity upper and lower extremities. Good ROM, no contractures. Normal muscle tone.  Skin: no rashes, lesions, ulcers. No induration Neurologic: CN 2-12 grossly intact. Sensation intact, DTR normal. Strength 5/5 in all 4.  Psychiatric: Normal judgment and insight. Alert and oriented x 3. Normal mood.    Impression and Plan:  Encounter for preventive health examination  -I have advised routine eye and dental care. -She is due for flu vaccine but she declines despite counseling.  Otherwise immunizations are up-to-date. -Screening labs today. -Healthy lifestyle discussed in detail. -Refer to GI for screening colonoscopy. -Pap smear done in office today. -Had a mammogram in August and has a 76-monthfollow-up.  Current moderate episode of major depressive disorder without prior episode ([facility]    [facility] PHQ-9 Total Score  1     -Mood is stable, refill  Celexa  Vitamin D deficiency -Check vitamin D levels  Insomnia, unspecified type -Infrequent Ambien use  Anxiety -During social speaking for which she uses Xanax very sporadically.  Obesity (BMI 35.0-39.9 without comorbidity) -Discussed healthy lifestyle, including increased physical activity and better food choices to promote weight loss.  Systolic murmur, new -Echo to evaluate.     Patient Instructions  -Nice seeing you today!!  -Lab work today; will notify you once results are available.  -Consider flu vaccine.  -Schedule follow up in 6 months.   Preventive Care [id number] Old, Female Preventive care refers to visits with your health care provider and lifestyle choices that can promote health and wellness. This includes:  A yearly physical exam. This may also be called an annual well check.  Regular dental visits and eye exams.  Immunizations.  Screening for certain conditions.  Healthy lifestyle choices, such as eating a healthy diet, getting regular exercise, not using drugs or products that contain nicotine and tobacco, and limiting alcohol use. What can I expect for my preventive care visit? Physical exam Your health care provider will check your:  Height and weight. This may be used to calculate body mass index (BMI), which tells if you are at a healthy weight.  Heart rate and blood pressure.  Skin for abnormal spots. Counseling Your health care provider may ask you questions about  your:  Alcohol, tobacco, and drug use.  Emotional well-being.  Home and relationship well-being.  Sexual activity.  Eating habits.  Work and work Statistician.  Method of birth control.  Menstrual cycle.  Pregnancy history. What immunizations do I need?  Influenza (flu) vaccine  This is recommended every year. Tetanus, diphtheria, and pertussis (Tdap) vaccine  You may need a Td booster every 10 years. Varicella (chickenpox) vaccine  You may need this  if you have not been vaccinated. Zoster (shingles) vaccine  You may need this after age 42. Measles, mumps, and rubella (MMR) vaccine  You may need at least one dose of MMR if you were born in 1957 or later. You may also need a second dose. Pneumococcal conjugate (PCV13) vaccine  You may need this if you have certain conditions and were not previously vaccinated. Pneumococcal polysaccharide (PPSV23) vaccine  You may need one or two doses if you smoke cigarettes or if you have certain conditions. Meningococcal conjugate (MenACWY) vaccine  You may need this if you have certain conditions. Hepatitis A vaccine  You may need this if you have certain conditions or if you travel or work in places where you may be exposed to hepatitis A. Hepatitis B vaccine  You may need this if you have certain conditions or if you travel or work in places where you may be exposed to hepatitis B. Haemophilus influenzae type b (Hib) vaccine  You may need this if you have certain conditions. Human papillomavirus (HPV) vaccine  If recommended by your health care provider, you may need three doses over 6 months. You may receive vaccines as individual doses or as more than one vaccine together in one shot (combination vaccines). Talk with your health care provider about the risks and benefits of combination vaccines. What tests do I need? Blood tests  Lipid and cholesterol levels. These may be checked every 5 years, or more frequently if you are over 46 years old.  Hepatitis C test.  Hepatitis B test. Screening  Lung cancer screening. You may have this screening every year starting at age 82 if you have a 30-pack-year history of smoking and currently smoke or have quit within the past 15 years.  Colorectal cancer screening. All adults should have this screening starting at age 20 and continuing until age 9. Your health care provider may recommend screening at age 39 if you are at increased risk. You  will have tests every 1-10 years, depending on your results and the type of screening test.  Diabetes screening. This is done by checking your blood sugar (glucose) after you have not eaten for a while (fasting). You may have this done every 1-3 years.  Mammogram. This may be done every 1-2 years. Talk with your health care provider about when you should start having regular mammograms. This may depend on whether you have a family history of breast cancer.  BRCA-related cancer screening. This may be done if you have a family history of breast, ovarian, tubal, or peritoneal cancers.  Pelvic exam and Pap test. This may be done every 3 years starting at age 67. Starting at age 66, this may be done every 5 years if you have a Pap test in combination with an HPV test. Other tests  Sexually transmitted disease (STD) testing.  Bone density scan. This is done to screen for osteoporosis. You may have this scan if you are at high risk for osteoporosis. Follow these instructions at home: Eating and drinking  Eat  a diet that includes fresh fruits and vegetables, whole grains, lean protein, and low-fat dairy.  Take vitamin and mineral supplements as recommended by your health care provider.  Do not drink alcohol if: ? Your health care provider tells you not to drink. ? You are pregnant, may be pregnant, or are planning to become pregnant.  If you drink alcohol: ? Limit how much you have to 0-1 drink a day. ? Be aware of how much alcohol is in your drink. In the [place] one drink equals one 12 oz bottle of beer (355 mL), one 5 oz glass of wine (148 mL), or one 1 oz glass of hard liquor (44 mL). Lifestyle  Take daily care of your teeth and gums.  Stay active. Exercise for at least 30 minutes on 5 or more days each week.  Do not use any products that contain nicotine or tobacco, such as cigarettes, e-cigarettes, and chewing tobacco. If you need help quitting, ask your health care provider.  If you  are sexually active, practice safe sex. Use a condom or other form of birth control (contraception) in order to prevent pregnancy and STIs (sexually transmitted infections).  If told by your health care provider, take low-dose aspirin daily starting at age 32. What's next?  Visit your health care provider once a year for a well check visit.  Ask your health care provider how often you should have your eyes and teeth checked.  Stay up to date on all vaccines. This information is not intended to replace advice given to you by your health care provider. Make sure you discuss any questions you have with your health care provider. Document Revised: 08/02/2018 Document Reviewed: 08/02/2018 Elsevier Patient Education  2020 [place], [place] [facility]

## 2019-12-31 NOTE — Patient Instructions (Signed)
-Nice seeing you today!!  -Lab work today; will notify you once results are available.  -Consider flu vaccine.  -Schedule follow up in 6 months.   Preventive Care 58-47 Years Old, Female Preventive care refers to visits with your health care provider and lifestyle choices that can promote health and wellness. This includes:  A yearly physical exam. This may also be called an annual well check.  Regular dental visits and eye exams.  Immunizations.  Screening for certain conditions.  Healthy lifestyle choices, such as eating a healthy diet, getting regular exercise, not using drugs or products that contain nicotine and tobacco, and limiting alcohol use. What can I expect for my preventive care visit? Physical exam Your health care provider will check your:  Height and weight. This may be used to calculate body mass index (BMI), which tells if you are at a healthy weight.  Heart rate and blood pressure.  Skin for abnormal spots. Counseling Your health care provider may ask you questions about your:  Alcohol, tobacco, and drug use.  Emotional well-being.  Home and relationship well-being.  Sexual activity.  Eating habits.  Work and work Statistician.  Method of birth control.  Menstrual cycle.  Pregnancy history. What immunizations do I need?  Influenza (flu) vaccine  This is recommended every year. Tetanus, diphtheria, and pertussis (Tdap) vaccine  You may need a Td booster every 10 years. Varicella (chickenpox) vaccine  You may need this if you have not been vaccinated. Zoster (shingles) vaccine  You may need this after age 88. Measles, mumps, and rubella (MMR) vaccine  You may need at least one dose of MMR if you were born in 1957 or later. You may also need a second dose. Pneumococcal conjugate (PCV13) vaccine  You may need this if you have certain conditions and were not previously vaccinated. Pneumococcal polysaccharide (PPSV23) vaccine  You  may need one or two doses if you smoke cigarettes or if you have certain conditions. Meningococcal conjugate (MenACWY) vaccine  You may need this if you have certain conditions. Hepatitis A vaccine  You may need this if you have certain conditions or if you travel or work in places where you may be exposed to hepatitis A. Hepatitis B vaccine  You may need this if you have certain conditions or if you travel or work in places where you may be exposed to hepatitis B. Haemophilus influenzae type b (Hib) vaccine  You may need this if you have certain conditions. Human papillomavirus (HPV) vaccine  If recommended by your health care provider, you may need three doses over 6 months. You may receive vaccines as individual doses or as more than one vaccine together in one shot (combination vaccines). Talk with your health care provider about the risks and benefits of combination vaccines. What tests do I need? Blood tests  Lipid and cholesterol levels. These may be checked every 5 years, or more frequently if you are over 58 years old.  Hepatitis C test.  Hepatitis B test. Screening  Lung cancer screening. You may have this screening every year starting at age 68 if you have a 30-pack-year history of smoking and currently smoke or have quit within the past 15 years.  Colorectal cancer screening. All adults should have this screening starting at age 43 and continuing until age 40. Your health care provider may recommend screening at age 66 if you are at increased risk. You will have tests every 1-10 years, depending on your results and the type  of screening test.  Diabetes screening. This is done by checking your blood sugar (glucose) after you have not eaten for a while (fasting). You may have this done every 1-3 years.  Mammogram. This may be done every 1-2 years. Talk with your health care provider about when you should start having regular mammograms. This may depend on whether you have a  family history of breast cancer.  BRCA-related cancer screening. This may be done if you have a family history of breast, ovarian, tubal, or peritoneal cancers.  Pelvic exam and Pap test. This may be done every 3 years starting at age 70. Starting at age 31, this may be done every 5 years if you have a Pap test in combination with an HPV test. Other tests  Sexually transmitted disease (STD) testing.  Bone density scan. This is done to screen for osteoporosis. You may have this scan if you are at high risk for osteoporosis. Follow these instructions at home: Eating and drinking  Eat a diet that includes fresh fruits and vegetables, whole grains, lean protein, and low-fat dairy.  Take vitamin and mineral supplements as recommended by your health care provider.  Do not drink alcohol if: ? Your health care provider tells you not to drink. ? You are pregnant, may be pregnant, or are planning to become pregnant.  If you drink alcohol: ? Limit how much you have to 0-1 drink a day. ? Be aware of how much alcohol is in your drink. In the [place] one drink equals one 12 oz bottle of beer (355 mL), one 5 oz glass of wine (148 mL), or one 1 oz glass of hard liquor (44 mL). Lifestyle  Take daily care of your teeth and gums.  Stay active. Exercise for at least 30 minutes on 5 or more days each week.  Do not use any products that contain nicotine or tobacco, such as cigarettes, e-cigarettes, and chewing tobacco. If you need help quitting, ask your health care provider.  If you are sexually active, practice safe sex. Use a condom or other form of birth control (contraception) in order to prevent pregnancy and STIs (sexually transmitted infections).  If told by your health care provider, take low-dose aspirin daily starting at age 30. What's next?  Visit your health care provider once a year for a well check visit.  Ask your health care provider how often you should have your eyes and teeth  checked.  Stay up to date on all vaccines. This information is not intended to replace advice given to you by your health care provider. Make sure you discuss any questions you have with your health care provider. Document Revised: 08/02/2018 Document Reviewed: 08/02/2018 Elsevier Patient Education  2020 [employer].

## 2020-01-02 ENCOUNTER — Other Ambulatory Visit (INDEPENDENT_AMBULATORY_CARE_PROVIDER_SITE_OTHER): Payer: BC Managed Care – PPO

## 2020-01-02 ENCOUNTER — Other Ambulatory Visit: Payer: Self-pay

## 2020-01-02 DIAGNOSIS — Z0000 Encounter for general adult medical examination without abnormal findings: Secondary | ICD-10-CM

## 2020-01-02 DIAGNOSIS — E559 Vitamin D deficiency, unspecified: Secondary | ICD-10-CM

## 2020-01-02 LAB — CBC WITH DIFFERENTIAL/PLATELET
Basophils Absolute: 0 10*3/uL (ref 0.0–0.1)
Basophils Relative: 0.6 % (ref 0.0–3.0)
Eosinophils Absolute: 0 10*3/uL (ref 0.0–0.7)
Eosinophils Relative: 0.6 % (ref 0.0–5.0)
HCT: 41.1 % (ref 36.0–46.0)
Hemoglobin: 13.7 g/dL (ref 12.0–15.0)
Lymphocytes Relative: 27.9 % (ref 12.0–46.0)
Lymphs Abs: 2.1 10*3/uL (ref 0.7–4.0)
MCHC: 33.3 g/dL (ref 30.0–36.0)
MCV: 94.3 fl (ref 78.0–100.0)
Monocytes Absolute: 0.5 10*3/uL (ref 0.1–1.0)
Monocytes Relative: 6.9 % (ref 3.0–12.0)
Neutro Abs: 4.8 10*3/uL (ref 1.4–7.7)
Neutrophils Relative %: 64 % (ref 43.0–77.0)
Platelets: 275 10*3/uL (ref 150.0–400.0)
RBC: 4.36 Mil/uL (ref 3.87–5.11)
RDW: 13.7 % (ref 11.5–15.5)
WBC: 7.5 10*3/uL (ref 4.0–10.5)

## 2020-01-02 LAB — VITAMIN B12: Vitamin B-12: 260 pg/mL (ref 211–911)

## 2020-01-02 LAB — LIPID PANEL
Cholesterol: 269 mg/dL — ABNORMAL HIGH (ref 0–200)
HDL: 56 mg/dL (ref >39.00)
LDL Cholesterol: 179 mg/dL — ABNORMAL HIGH (ref 0–99)
NonHDL: 212.86
Total CHOL/HDL Ratio: 5
Triglycerides: 167 mg/dL — ABNORMAL HIGH (ref 0.0–149.0)
VLDL: 33.4 mg/dL (ref 0.0–40.0)

## 2020-01-02 LAB — HEMOGLOBIN A1C: Hgb A1c MFr Bld: 5.6 % (ref 4.6–6.5)

## 2020-01-02 LAB — COMPREHENSIVE METABOLIC PANEL
ALT: 11 U/L (ref 0–35)
AST: 13 U/L (ref 0–37)
Albumin: 4.2 g/dL (ref 3.5–5.2)
Alkaline Phosphatase: 78 U/L (ref 39–117)
BUN: 8 mg/dL (ref 6–23)
CO2: 27 mEq/L (ref 19–32)
Calcium: 8.9 mg/dL (ref 8.4–10.5)
Chloride: 104 mEq/L (ref 96–112)
Creatinine, Ser: 0.91 mg/dL (ref 0.40–1.20)
GFR: 66.44 mL/min (ref >60.00)
Glucose, Bld: 88 mg/dL (ref 70–99)
Potassium: 4.4 mEq/L (ref 3.5–5.1)
Sodium: 138 mEq/L (ref 135–145)
Total Bilirubin: 0.4 mg/dL (ref 0.2–1.2)
Total Protein: 6.7 g/dL (ref 6.0–8.3)

## 2020-01-02 LAB — CYTOLOGY - PAP
Comment: NEGATIVE
Diagnosis: NEGATIVE
High risk HPV: NEGATIVE

## 2020-01-02 LAB — VITAMIN D 25 HYDROXY (VIT D DEFICIENCY, FRACTURES): VITD: 11.23 ng/mL — ABNORMAL LOW (ref 30.00–100.00)

## 2020-01-02 LAB — TSH: TSH: 1.26 u[IU]/mL (ref 0.35–4.50)

## 2020-01-03 ENCOUNTER — Other Ambulatory Visit: Payer: Self-pay | Admitting: Internal Medicine

## 2020-01-03 ENCOUNTER — Encounter: Payer: Self-pay | Admitting: Internal Medicine

## 2020-01-03 ENCOUNTER — Telehealth: Payer: Self-pay | Admitting: Internal Medicine

## 2020-01-03 DIAGNOSIS — E785 Hyperlipidemia, unspecified: Secondary | ICD-10-CM | POA: Insufficient documentation

## 2020-01-03 DIAGNOSIS — E559 Vitamin D deficiency, unspecified: Secondary | ICD-10-CM

## 2020-01-03 MED ORDER — ATORVASTATIN CALCIUM 10 MG PO TABS: 10.0000 mg | ORAL_TABLET | Freq: Every day | ORAL | 1 refills | Status: DC

## 2020-01-03 MED ORDER — CITALOPRAM HYDROBROMIDE 40 MG PO TABS: 40.0000 mg | ORAL_TABLET | Freq: Every day | ORAL | 1 refills | Status: DC

## 2020-01-03 MED ORDER — VITAMIN D (ERGOCALCIFEROL) 1.25 MG (50000 UNIT) PO CAPS: 50000.0000 [IU] | ORAL_CAPSULE | ORAL | 0 refills | Status: AC

## 2020-01-03 NOTE — Telephone Encounter (Signed)
Pt is returning call regarding labs. Thanks  Pt's # [id number].

## 2020-01-03 NOTE — Telephone Encounter (Signed)
Rx sent 

## 2020-01-03 NOTE — Telephone Encounter (Signed)
Pt is calling in stating that she has been out of this medication for two weeks and would like to see if it can be called in today.  Rx citalopram (CELEXA) 40 MG  Pharm:  [facility] on [address]

## 2020-01-06 LAB — HM MAMMOGRAPHY

## 2020-01-06 LAB — HM PAP SMEAR

## 2020-01-07 ENCOUNTER — Other Ambulatory Visit: Payer: Self-pay | Admitting: Internal Medicine

## 2020-01-07 DIAGNOSIS — E785 Hyperlipidemia, unspecified: Secondary | ICD-10-CM

## 2020-01-07 DIAGNOSIS — E559 Vitamin D deficiency, unspecified: Secondary | ICD-10-CM

## 2020-01-27 ENCOUNTER — Other Ambulatory Visit: Payer: Self-pay | Admitting: Internal Medicine

## 2020-01-27 DIAGNOSIS — G4700 Insomnia, unspecified: Secondary | ICD-10-CM

## 2020-01-27 DIAGNOSIS — F419 Anxiety disorder, unspecified: Secondary | ICD-10-CM

## 2020-01-28 ENCOUNTER — Other Ambulatory Visit: Payer: Self-pay | Admitting: Internal Medicine

## 2020-01-28 ENCOUNTER — Other Ambulatory Visit: Payer: Self-pay

## 2020-01-28 ENCOUNTER — Ambulatory Visit
Admission: RE | Admit: 2020-01-28 | Discharge: 2020-01-28 | Disposition: A | Discharge status: Home / Self Care | Payer: BC Managed Care – PPO | Source: Ambulatory Visit | Attending: Internal Medicine | Admitting: Internal Medicine

## 2020-01-28 DIAGNOSIS — N6314 Unspecified lump in the right breast, lower inner quadrant: Secondary | ICD-10-CM | POA: Diagnosis not present

## 2020-01-28 DIAGNOSIS — N6001 Solitary cyst of right breast: Secondary | ICD-10-CM

## 2020-02-28 ENCOUNTER — Encounter: Payer: Self-pay | Admitting: Internal Medicine

## 2020-03-21 ENCOUNTER — Other Ambulatory Visit: Payer: Self-pay | Admitting: Internal Medicine

## 2020-03-21 DIAGNOSIS — E559 Vitamin D deficiency, unspecified: Secondary | ICD-10-CM

## 2020-04-07 ENCOUNTER — Other Ambulatory Visit: Payer: Self-pay

## 2020-04-09 ENCOUNTER — Other Ambulatory Visit: Payer: BC Managed Care – PPO

## 2020-04-28 ENCOUNTER — Other Ambulatory Visit: Payer: Self-pay

## 2020-04-28 ENCOUNTER — Other Ambulatory Visit (INDEPENDENT_AMBULATORY_CARE_PROVIDER_SITE_OTHER): Payer: BC Managed Care – PPO

## 2020-04-28 DIAGNOSIS — E559 Vitamin D deficiency, unspecified: Secondary | ICD-10-CM | POA: Diagnosis not present

## 2020-04-28 DIAGNOSIS — E785 Hyperlipidemia, unspecified: Secondary | ICD-10-CM

## 2020-04-28 LAB — LIPID PANEL
Cholesterol: 186 mg/dL (ref 0–200)
HDL: 51 mg/dL (ref >39.00)
LDL Cholesterol: 103 mg/dL — ABNORMAL HIGH (ref 0–99)
NonHDL: 135.38
Total CHOL/HDL Ratio: 4
Triglycerides: 163 mg/dL — ABNORMAL HIGH (ref 0.0–149.0)
VLDL: 32.6 mg/dL (ref 0.0–40.0)

## 2020-04-28 LAB — VITAMIN D 25 HYDROXY (VIT D DEFICIENCY, FRACTURES): VITD: 25.09 ng/mL — ABNORMAL LOW (ref 30.00–100.00)

## 2020-04-29 ENCOUNTER — Other Ambulatory Visit: Payer: Self-pay | Admitting: Internal Medicine

## 2020-04-29 DIAGNOSIS — E559 Vitamin D deficiency, unspecified: Secondary | ICD-10-CM

## 2020-04-29 MED ORDER — VITAMIN D (ERGOCALCIFEROL) 1.25 MG (50000 UNIT) PO CAPS: 50000.0000 [IU] | ORAL_CAPSULE | ORAL | 0 refills | Status: AC

## 2020-04-30 ENCOUNTER — Other Ambulatory Visit: Payer: Self-pay | Admitting: Internal Medicine

## 2020-04-30 DIAGNOSIS — E559 Vitamin D deficiency, unspecified: Secondary | ICD-10-CM

## 2020-06-30 ENCOUNTER — Other Ambulatory Visit: Payer: Self-pay | Admitting: Internal Medicine

## 2020-06-30 ENCOUNTER — Ambulatory Visit: Payer: BC Managed Care – PPO | Admitting: Internal Medicine

## 2020-06-30 MED ORDER — CITALOPRAM HYDROBROMIDE 40 MG PO TABS: 40.0000 mg | ORAL_TABLET | Freq: Every day | ORAL | 1 refills | Status: DC

## 2020-06-30 NOTE — Telephone Encounter (Signed)
Pt is calling in stating that she is out of her Rx cyclobenzaprine (FLEXERIL) 10 MG   Pharm:  [facility] on [address].  Pt state that she has spoken with Dr. Jerilee Hoh about her refilling this medication for her and pt is aware that we ask for 24-72 hrs for refills.

## 2020-07-02 MED ORDER — CYCLOBENZAPRINE HCL 10 MG PO TABS: ORAL_TABLET | ORAL | 0 refills | Status: DC

## 2020-07-02 NOTE — Addendum Note (Signed)
Addended by: Westley Hummer B on: 07/02/2020 12:10 PM   Modules accepted: Orders

## 2020-07-07 ENCOUNTER — Ambulatory Visit (INDEPENDENT_AMBULATORY_CARE_PROVIDER_SITE_OTHER): Payer: BC Managed Care – PPO | Admitting: Internal Medicine

## 2020-07-07 ENCOUNTER — Encounter: Payer: Self-pay | Admitting: Internal Medicine

## 2020-07-07 ENCOUNTER — Other Ambulatory Visit: Payer: Self-pay

## 2020-07-07 VITALS — BP 130/90 | HR 75 | Temp 98.2°F | Wt 245.2 lb

## 2020-07-07 DIAGNOSIS — F321 Major depressive disorder, single episode, moderate: Secondary | ICD-10-CM

## 2020-07-07 DIAGNOSIS — A6000 Herpesviral infection of urogenital system, unspecified: Secondary | ICD-10-CM

## 2020-07-07 DIAGNOSIS — E559 Vitamin D deficiency, unspecified: Secondary | ICD-10-CM | POA: Diagnosis not present

## 2020-07-07 DIAGNOSIS — M797 Fibromyalgia: Secondary | ICD-10-CM

## 2020-07-07 DIAGNOSIS — M79671 Pain in right foot: Secondary | ICD-10-CM

## 2020-07-07 DIAGNOSIS — G4700 Insomnia, unspecified: Secondary | ICD-10-CM

## 2020-07-07 DIAGNOSIS — E785 Hyperlipidemia, unspecified: Secondary | ICD-10-CM | POA: Diagnosis not present

## 2020-07-07 DIAGNOSIS — M7918 Myalgia, other site: Secondary | ICD-10-CM

## 2020-07-07 DIAGNOSIS — R011 Cardiac murmur, unspecified: Secondary | ICD-10-CM

## 2020-07-07 LAB — VITAMIN D 25 HYDROXY (VIT D DEFICIENCY, FRACTURES): Vit D, 25-Hydroxy: 41 ng/mL (ref 30–100)

## 2020-07-07 MED ORDER — ZOLPIDEM TARTRATE 10 MG PO TABS: 10.0000 mg | ORAL_TABLET | Freq: Every evening | ORAL | 1 refills | Status: DC | PRN

## 2020-07-07 NOTE — Addendum Note (Signed)
Addended by: Marrion Coy on: 07/07/2020 08:58 AM   Modules accepted: Orders

## 2020-07-07 NOTE — Progress Notes (Signed)
Established Patient Office Visit     This visit occurred during the SARS-CoV-2 public health emergency.  Safety protocols were in place, including screening questions prior to the visit, additional usage of staff PPE, and extensive cleaning of exam room while observing appropriate contact time as indicated for disinfecting solutions.    CC/Reason for Visit: 3-month follow-up chronic conditions  HPI: Barbara Yates is a 49 y.o. female who is coming in today for the above mentioned reasons. Past Medical History is significant for:   1. Depression with stable mood on Celexa, has never seen a psychiatrist or had CBT sessions.   2. Social anxiety. When presenting in front of large crowds for which she uses Xanax PRN. A Rx for 30 tabs will usually last her 4-6 months.  Not requesting refills today.  3. Insomnia. Uses ambien infrequently, xanax did not work for sleep.  Requesting refill today.  4.  Obesity with a BMI of 36.21  5. Vit D Def. has had a couple courses of high-dose supplementation, needs levels rechecked today.  6. Fibromyalgia/Myofascial Pain Sx. Used to be on hydrocodone but no longer.  7. Genital Herpes for which she uses suppressive therapy with Valtrex 1000mg  daily. Despite this, she gets 1-2 outbreaks per year.  She is complaining of some right heel pain today.  Past Medical/Surgical History: Past Medical History:  Diagnosis Date  . Anxiety   . Chronic back pain   . Depression   . Hyperlipidemia   . Insomnia   . Migraine headache   . Myofascial pain     Past Surgical History:  Procedure Laterality Date  . TUBAL LIGATION      Social History:  reports that she quit smoking about 21 years ago. Her smoking use included cigarettes. She started smoking about 24 years ago. She has a 0.75 pack-year smoking history. She has never used smokeless tobacco. She reports current alcohol use of about 4.0 standard drinks of alcohol per week. She reports that  she does not use drugs.  Allergies: No Known Allergies  Family History:  Family History  Problem Relation Age of Onset  . Hypertension Paternal Grandmother   . CVA Paternal Grandmother   . Alzheimer's disease Paternal Grandmother   . Cancer Paternal Grandfather        Pancreatic  . Pancreatic cancer Maternal Grandfather      Current Outpatient Medications:  .  ALPRAZolam (XANAX) 1 MG tablet, TAKE 1 TABLET BY MOUTH EVERY DAY AS NEEDED FOR ANXIETY, Disp: 30 tablet, Rfl: 1 .  atorvastatin (LIPITOR) 10 MG tablet, Take 1 tablet (10 mg total) by mouth daily., Disp: 90 tablet, Rfl: 1 .  citalopram (CELEXA) 40 MG tablet, Take 1 tablet (40 mg total) by mouth daily., Disp: 90 tablet, Rfl: 1 .  cyclobenzaprine (FLEXERIL) 10 MG tablet, TAKE 1 TABLET BY MOUTH AT BEDTIME AS NEEDED FOR SPASMS, Disp: 20 tablet, Rfl: 0 .  valACYclovir (VALTREX) 1000 MG tablet, TAKE 1 TABLET (1,000 MG TOTAL) BY MOUTH DAILY., Disp: 30 tablet, Rfl: 11 .  Vitamin D, Ergocalciferol, (DRISDOL) 1.25 MG (50000 UNIT) CAPS capsule, Take 1 capsule (50,000 Units total) by mouth every 7 (seven) days for 12 doses., Disp: 12 capsule, Rfl: 0 .  zolpidem (AMBIEN) 10 MG tablet, Take 1 tablet (10 mg total) by mouth at bedtime as needed for sleep., Disp: 15 tablet, Rfl: 1  Review of Systems:  Constitutional: Denies fever, chills, diaphoresis, appetite change and fatigue.  HEENT: Denies photophobia, eye  pain, redness, hearing loss, ear pain, congestion, sore throat, rhinorrhea, sneezing, mouth sores, trouble swallowing, neck pain, neck stiffness and tinnitus.   Respiratory: Denies SOB, DOE, cough, chest tightness,  and wheezing.   Cardiovascular: Denies chest pain, palpitations and leg swelling.  Gastrointestinal: Denies nausea, vomiting, abdominal pain, diarrhea, constipation, blood in stool and abdominal distention.  Genitourinary: Denies dysuria, urgency, frequency, hematuria, flank pain and difficulty urinating.  Endocrine: Denies:  hot or cold intolerance, sweats, changes in hair or nails, polyuria, polydipsia. Musculoskeletal: Denies myalgias, back pain, joint swelling, arthralgias and gait problem.  Skin: Denies pallor, rash and wound.  Neurological: Denies dizziness, seizures, syncope, weakness, light-headedness, numbness and headaches.  Hematological: Denies adenopathy. Easy bruising, personal or family bleeding history  Psychiatric/Behavioral: Denies suicidal ideation, mood changes, confusion, nervousness, sleep disturbance and agitation    Physical Exam: Vitals:   07/07/20 0834  BP: 130/90  Pulse: 75  Temp: 98.2 F (36.8 C)  TempSrc: Oral  SpO2: 95%  Weight: 245 lb 3.2 oz (111.2 kg)    Body mass index is 36.21 kg/m.   Constitutional: NAD, calm, comfortable Eyes: PERRL, lids and conjunctivae normal, wears corrective lenses ENMT: Mucous membranes are moist.  Respiratory: clear to auscultation bilaterally, no wheezing, no crackles. Normal respiratory effort. No accessory muscle use.  Cardiovascular: Regular rate and rhythm, positive systolic murmur, no rubs or gallops. No extremity edema.   Neurologic: Grossly intact and nonfocal Psychiatric: Normal judgment and insight. Alert and oriented x 3. Normal mood.    Impression and Plan:  Pain of right heel  - Plan: Ambulatory referral to Podiatry -Based on exam today, suspect she likely has a corn of her right heel, less likely retained foreign body.  Insomnia, unspecified type  - Plan: zolpidem (AMBIEN) 10 MG tablet  Hyperlipidemia, unspecified hyperlipidemia type -Significant improvement in LDL after starting Lipitor.  Genital herpes simplex, unspecified site -On daily suppressive therapy.  Myofascial pain Fibromyalgia -She manages this with ice, frequent massage.  Vitamin D deficiency  - Plan: VITAMIN D 25 Hydroxy (Vit-D Deficiency, Fractures)  Current moderate episode of major depressive disorder without prior episode (HCC) -Mood stable  today, continue Celexa.    Patient Instructions  -Nice seeing you today!!  -Lab work today; will notify you once results are available.  -Podiatry referral today.  -Schedule follow up in 6 months for your physical. Come in fasting that day.     Lelon Frohlich, MD [facility]

## 2020-07-07 NOTE — Patient Instructions (Signed)
-  Nice seeing you today!!  -Lab work today; will notify you once results are available.  -Podiatry referral today.  -Schedule follow up in 6 months for your physical. Come in fasting that day.

## 2020-07-18 ENCOUNTER — Other Ambulatory Visit: Payer: Self-pay | Admitting: Internal Medicine

## 2020-07-18 DIAGNOSIS — E785 Hyperlipidemia, unspecified: Secondary | ICD-10-CM

## 2020-07-20 ENCOUNTER — Other Ambulatory Visit: Payer: Self-pay | Admitting: Internal Medicine

## 2020-07-20 DIAGNOSIS — A6000 Herpesviral infection of urogenital system, unspecified: Secondary | ICD-10-CM

## 2020-07-23 ENCOUNTER — Other Ambulatory Visit: Payer: Self-pay | Admitting: Internal Medicine

## 2020-07-23 DIAGNOSIS — E559 Vitamin D deficiency, unspecified: Secondary | ICD-10-CM

## 2020-07-27 ENCOUNTER — Encounter: Payer: Self-pay | Admitting: Family Medicine

## 2020-07-27 ENCOUNTER — Telehealth (INDEPENDENT_AMBULATORY_CARE_PROVIDER_SITE_OTHER): Payer: BC Managed Care – PPO | Admitting: Family Medicine

## 2020-07-27 VITALS — Temp 100.0°F | Wt 245.0 lb

## 2020-07-27 DIAGNOSIS — R509 Fever, unspecified: Secondary | ICD-10-CM | POA: Diagnosis not present

## 2020-07-27 NOTE — Progress Notes (Signed)
Patient ID: Barbara Yates, female   DOB: [birth year], 49 y.o.   MRN: [id number]  This visit type was conducted due to national recommendations for restrictions regarding the COVID-19 pandemic in an effort to limit this patient's exposure and mitigate transmission in our community.   Virtual Visit via Video Note  I connected with Barbara Yates on 07/27/20 at 10:00 AM EDT by a video enabled telemedicine application and verified that I am speaking with the correct person using two identifiers.  Location patient: home Location provider:work or home office Persons participating in the virtual visit: patient, provider  I discussed the limitations of evaluation and management by telemedicine and the availability of in person appointments. The patient expressed understanding and agreed to proceed.   HPI:  Barbara Yates states that last Friday she left work with some body aches.  By that night she had temperature 102.3.  She has taken some Tylenol and ibuprofen but she has maintained low-grade fever since then.  She has also had some diarrhea.  Minimal cough.  Minimal nasal congestion.  Profound fatigue.  Decreased appetite.  She is keeping down fluids well.  No loss of taste or smell.  She states a couple coworkers were diagnosed last week with Covid but she did not have particularly close contact with them.  She is generally fairly healthy.  No history of chronic lung or heart problems.  Denies any dyspnea.  ROS: See pertinent positives and negatives per HPI.  Past Medical History:  Diagnosis Date  . Anxiety   . Chronic back pain   . Depression   . Hyperlipidemia   . Insomnia   . Migraine headache   . Myofascial pain     Past Surgical History:  Procedure Laterality Date  . TUBAL LIGATION      Family History  Problem Relation Age of Onset  . Hypertension Paternal Grandmother   . CVA Paternal Grandmother   . Alzheimer's disease Paternal Grandmother   . Cancer Paternal Grandfather         Pancreatic  . Pancreatic cancer Maternal Grandfather     SOCIAL HX: Quit smoking 1999   Current Outpatient Medications:  .  ALPRAZolam (XANAX) 1 MG tablet, TAKE 1 TABLET BY MOUTH EVERY DAY AS NEEDED FOR ANXIETY, Disp: 30 tablet, Rfl: 1 .  atorvastatin (LIPITOR) 10 MG tablet, TAKE 1 TABLET BY MOUTH EVERY DAY, Disp: 90 tablet, Rfl: 1 .  citalopram (CELEXA) 40 MG tablet, Take 1 tablet (40 mg total) by mouth daily., Disp: 90 tablet, Rfl: 1 .  cyclobenzaprine (FLEXERIL) 10 MG tablet, TAKE 1 TABLET BY MOUTH AT BEDTIME AS NEEDED FOR SPASMS, Disp: 20 tablet, Rfl: 0 .  valACYclovir (VALTREX) 1000 MG tablet, TAKE 1 TABLET BY MOUTH EVERY DAY, Disp: 90 tablet, Rfl: 1 .  zolpidem (AMBIEN) 10 MG tablet, Take 1 tablet (10 mg total) by mouth at bedtime as needed for sleep., Disp: 15 tablet, Rfl: 1  EXAM:  VITALS per patient if applicable:  GENERAL: alert, oriented, appears well and in no acute distress  HEENT: atraumatic, conjunttiva clear, no obvious abnormalities on inspection of external nose and ears  NECK: normal movements of the head and neck  LUNGS: on inspection no signs of respiratory distress, breathing rate appears normal, no obvious gross SOB, gasping or wheezing  CV: no obvious cyanosis  MS: moves all visible extremities without noticeable abnormality  PSYCH/NEURO: pleasant and cooperative, no obvious depression or anxiety, speech and thought processing grossly intact  ASSESSMENT AND PLAN:  Discussed the following assessment and plan:  Febrile illness.  Strong clinical suspicion for COVID-19 infection.  Patient has not been vaccinated.  She is in no respiratory distress.  She has not been tested yet  -We discussed possible testing to further clarify she will consider doing that today  -Minimum 10-day quarantine from onset of symptoms.  Work note given  [employer] of fluids and rest.  She has fairly low risk but knows to follow-up promptly for any increased shortness of breath or  other concern     I discussed the assessment and treatment plan with the patient. The patient was provided an opportunity to ask questions and all were answered. The patient agreed with the plan and demonstrated an understanding of the instructions.   The patient was advised to call back or seek an in-person evaluation if the symptoms worsen or if the condition fails to improve as anticipated.     Carolann Littler, MD

## 2020-07-29 ENCOUNTER — Other Ambulatory Visit: Payer: BC Managed Care – PPO

## 2020-09-15 ENCOUNTER — Other Ambulatory Visit: Payer: Self-pay | Admitting: Internal Medicine

## 2020-09-15 DIAGNOSIS — F419 Anxiety disorder, unspecified: Secondary | ICD-10-CM

## 2020-09-29 DIAGNOSIS — Z23 Encounter for immunization: Secondary | ICD-10-CM | POA: Diagnosis not present

## 2020-10-23 DIAGNOSIS — Z23 Encounter for immunization: Secondary | ICD-10-CM | POA: Diagnosis not present

## 2020-10-28 ENCOUNTER — Other Ambulatory Visit: Payer: Self-pay

## 2020-10-28 ENCOUNTER — Encounter (HOSPITAL_COMMUNITY): Payer: Self-pay

## 2020-10-28 ENCOUNTER — Emergency Department (HOSPITAL_COMMUNITY): Payer: BC Managed Care – PPO

## 2020-10-28 ENCOUNTER — Emergency Department (HOSPITAL_COMMUNITY)
Admission: EM | Admit: 2020-10-28 | Discharge: 2020-10-28 | Disposition: A | Discharge status: Home / Self Care | Payer: BC Managed Care – PPO | Attending: Emergency Medicine | Admitting: Emergency Medicine

## 2020-10-28 DIAGNOSIS — R100 Acute abdomen: Secondary | ICD-10-CM | POA: Diagnosis not present

## 2020-10-28 DIAGNOSIS — R112 Nausea with vomiting, unspecified: Secondary | ICD-10-CM | POA: Insufficient documentation

## 2020-10-28 DIAGNOSIS — R197 Diarrhea, unspecified: Secondary | ICD-10-CM | POA: Insufficient documentation

## 2020-10-28 DIAGNOSIS — N739 Female pelvic inflammatory disease, unspecified: Secondary | ICD-10-CM | POA: Insufficient documentation

## 2020-10-28 DIAGNOSIS — R109 Unspecified abdominal pain: Secondary | ICD-10-CM | POA: Diagnosis not present

## 2020-10-28 DIAGNOSIS — N730 Acute parametritis and pelvic cellulitis: Secondary | ICD-10-CM | POA: Diagnosis not present

## 2020-10-28 DIAGNOSIS — Z87891 Personal history of nicotine dependence: Secondary | ICD-10-CM | POA: Insufficient documentation

## 2020-10-28 DIAGNOSIS — Z114 Encounter for screening for human immunodeficiency virus [HIV]: Secondary | ICD-10-CM | POA: Diagnosis not present

## 2020-10-28 DIAGNOSIS — R102 Pelvic and perineal pain: Secondary | ICD-10-CM

## 2020-10-28 DIAGNOSIS — Z79899 Other long term (current) drug therapy: Secondary | ICD-10-CM | POA: Insufficient documentation

## 2020-10-28 DIAGNOSIS — D259 Leiomyoma of uterus, unspecified: Secondary | ICD-10-CM | POA: Diagnosis not present

## 2020-10-28 LAB — COMPREHENSIVE METABOLIC PANEL
ALT: 11 U/L (ref 0–44)
AST: 12 U/L — ABNORMAL LOW (ref 15–41)
Albumin: 3.9 g/dL (ref 3.5–5.0)
Alkaline Phosphatase: 96 U/L (ref 38–126)
Anion gap: 13 (ref 5–15)
BUN: 9 mg/dL (ref 6–20)
CO2: 24 mmol/L (ref 22–32)
Calcium: 8.6 mg/dL — ABNORMAL LOW (ref 8.9–10.3)
Chloride: 101 mmol/L (ref 98–111)
Creatinine, Ser: 0.96 mg/dL (ref 0.44–1.00)
GFR, Estimated: >60 mL/min (ref >60)
Glucose, Bld: 108 mg/dL — ABNORMAL HIGH (ref 70–99)
Potassium: 3.1 mmol/L — ABNORMAL LOW (ref 3.5–5.1)
Sodium: 138 mmol/L (ref 135–145)
Total Bilirubin: 0.6 mg/dL (ref 0.3–1.2)
Total Protein: 8.1 g/dL (ref 6.5–8.1)

## 2020-10-28 LAB — URINALYSIS, ROUTINE W REFLEX MICROSCOPIC
Bacteria, UA: NONE SEEN
Bilirubin Urine: NEGATIVE
Glucose, UA: NEGATIVE mg/dL
Ketones, ur: 20 mg/dL — AB
Leukocytes,Ua: NEGATIVE
Nitrite: NEGATIVE
Protein, ur: NEGATIVE mg/dL
Specific Gravity, Urine: >1.046 — ABNORMAL HIGH (ref 1.005–1.030)
pH: 6 (ref 5.0–8.0)

## 2020-10-28 LAB — WET PREP, GENITAL
Clue Cells Wet Prep HPF POC: NONE SEEN
Sperm: NONE SEEN
Trich, Wet Prep: NONE SEEN
WBC, Wet Prep HPF POC: NONE SEEN
Yeast Wet Prep HPF POC: NONE SEEN

## 2020-10-28 LAB — CBC WITH DIFFERENTIAL/PLATELET
Abs Immature Granulocytes: 0.09 10*3/uL — ABNORMAL HIGH (ref 0.00–0.07)
Basophils Absolute: 0 10*3/uL (ref 0.0–0.1)
Basophils Relative: 0 %
Eosinophils Absolute: 0 10*3/uL (ref 0.0–0.5)
Eosinophils Relative: 0 %
HCT: 40.2 % (ref 36.0–46.0)
Hemoglobin: 13.1 g/dL (ref 12.0–15.0)
Immature Granulocytes: 1 %
Lymphocytes Relative: 7 %
Lymphs Abs: 1.2 10*3/uL (ref 0.7–4.0)
MCH: 30.3 pg (ref 26.0–34.0)
MCHC: 32.6 g/dL (ref 30.0–36.0)
MCV: 93.1 fL (ref 80.0–100.0)
Monocytes Absolute: 0.7 10*3/uL (ref 0.1–1.0)
Monocytes Relative: 4 %
Neutro Abs: 14.1 10*3/uL — ABNORMAL HIGH (ref 1.7–7.7)
Neutrophils Relative %: 88 %
Platelets: 292 10*3/uL (ref 150–400)
RBC: 4.32 MIL/uL (ref 3.87–5.11)
RDW: 14.4 % (ref 11.5–15.5)
WBC: 16.1 10*3/uL — ABNORMAL HIGH (ref 4.0–10.5)
nRBC: 0 % (ref 0.0–0.2)

## 2020-10-28 LAB — I-STAT BETA HCG BLOOD, ED (MC, WL, AP ONLY): I-stat hCG, quantitative: <5 m[IU]/mL (ref <5)

## 2020-10-28 LAB — LIPASE, BLOOD: Lipase: 22 U/L (ref 11–51)

## 2020-10-28 MED ORDER — DOXYCYCLINE HYCLATE 100 MG PO CAPS: 100.0000 mg | ORAL_CAPSULE | Freq: Two times a day (BID) | ORAL | 0 refills | Status: DC

## 2020-10-28 MED ORDER — HYDROCODONE-ACETAMINOPHEN 5-325 MG PO TABS
2.0000 | ORAL_TABLET | Freq: Four times a day (QID) | ORAL | 0 refills | Status: DC | PRN
Start: 2020-10-28 — End: 2020-11-09

## 2020-10-28 MED ORDER — MORPHINE SULFATE (PF) 4 MG/ML IV SOLN
4.0000 mg | Freq: Once | INTRAVENOUS | Status: AC
  Administered 2020-10-28: 4 mg via INTRAVENOUS
  Filled 2020-10-28: qty 1

## 2020-10-28 MED ORDER — ONDANSETRON HCL 4 MG/2ML IJ SOLN
4.0000 mg | Freq: Once | INTRAMUSCULAR | Status: AC
  Administered 2020-10-28: 4 mg via INTRAVENOUS
  Filled 2020-10-28: qty 2

## 2020-10-28 MED ORDER — SODIUM CHLORIDE 0.9 % IV SOLN
1.0000 g | Freq: Once | INTRAVENOUS | Status: AC
  Administered 2020-10-28: 1 g via INTRAVENOUS
  Filled 2020-10-28: qty 10

## 2020-10-28 MED ORDER — SODIUM CHLORIDE 0.9 % IV BOLUS
1000.0000 mL | Freq: Once | INTRAVENOUS | Status: AC
  Administered 2020-10-28: 1000 mL via INTRAVENOUS

## 2020-10-28 MED ORDER — POTASSIUM CHLORIDE CRYS ER 20 MEQ PO TBCR
40.0000 meq | EXTENDED_RELEASE_TABLET | Freq: Once | ORAL | Status: AC
  Administered 2020-10-28: 40 meq via ORAL
  Filled 2020-10-28: qty 2

## 2020-10-28 MED ORDER — IOHEXOL 300 MG/ML  SOLN
100.0000 mL | Freq: Once | INTRAMUSCULAR | Status: AC | PRN
  Administered 2020-10-28: 100 mL via INTRAVENOUS

## 2020-10-28 MED ORDER — ONDANSETRON 4 MG PO TBDP: 4.0000 mg | ORAL_TABLET | Freq: Three times a day (TID) | ORAL | 0 refills | Status: DC | PRN

## 2020-10-28 MED ORDER — DOXYCYCLINE HYCLATE 100 MG PO TABS
100.0000 mg | ORAL_TABLET | Freq: Once | ORAL | Status: AC
  Administered 2020-10-28: 100 mg via ORAL
  Filled 2020-10-28: qty 1

## 2020-10-28 NOTE — ED Notes (Signed)
Pt currently in CT.

## 2020-10-28 NOTE — ED Triage Notes (Signed)
Pt arrived via POV, c/o diffuse abd pain, n/v and diarrhea x4 days, was seen at urgent care, referred to ed for concern or appendicitis. Pt given IM injection of phenergan at urgent care with minimal relief.

## 2020-10-28 NOTE — ED Notes (Signed)
Patient is in Ultrasound now

## 2020-10-28 NOTE — Discharge Instructions (Addendum)
-  Need to follow-up with both your primary care doctor and your OB/GYN within the next week.  You should call their office first thing Monday morning to schedule the next available appointment.  -You can take ibuprofen for pain.  This will help with inflammation as well.  You should take this as directed on the bottle.  Take it with food so does not cause an upset stomach.  Do not take any additional Aleve, Motrin or Advil as these medications are similar.  -Prescription sent to your pharmacy for doxycycline.  This an antibiotic used to treat pelvic inflammatory disease.  You were given your first dose tonight sedation start taking it tomorrow.  -Prescription also sent to the pharmacy for work-up.  This is a narcotic pain medicine do not take Tylenol at the same time as it already has Tylenol in it.  Do not take more than 4 g of Tylenol per day.  If your gonorrhea, chlamydia, HIV or syphilis test come back positive you need to inform recent sexual partners.  You have already been treated for gonorrhea and chlamydia, so if your tests are positive you do not any further antibiotics.  The ultrasound shows you have pelvic inflammatory disease with a cyst on the right side.  This is likely contributing to your pain. -An incidental finding of fibroids in her uterus was also seen.  -The CT scan of your abdomen shows thickening of your colon, you should follow-up with your primary care doctor if you continue to have pain as you might need a repeat CT scan to exclude soft tissue mass.

## 2020-10-28 NOTE — ED Provider Notes (Signed)
[facility] DEPT Provider Note   CSN: [id number] Arrival date & time: 10/28/20  1552     History Chief Complaint  Patient presents with  . Abdominal Pain    Barbara Yates is a 49 y.o. female with past medical history significant for anxiety, chronic back pain, hyperlipidemia, myofascial pain.  Surgical history includes tubal ligation.  HPI Patient presents emergency room today with chief complaint abdominal pain x1day.  She states pain has been progressively worsening. She describes the pain as a severe cramping sensation. She rates pain currently 3/10 in severity and states it waxes and wanes. She was seen in urgent care and sent to department for concern of appendicitis and was given IM phenergan prior to arrival. She tried taking muscle relaxer at home last night without symptom improvement. She has associated nausea, emesis, and diarrhea.  She admits to 3 episodes of nonbloody nonbilious emesis and approximately 8 episodes of watery diarrhea symptom onset. She has been unable to tolerate any PO intake.  She denies any recent travel or antibiotic use.  She did eat a takeout grilled chicken sandwich the day before symptoms started, no other friends or family members ate the same chicken as her. She drinks alcohol socially, admits to increased consumption lately because of life stressors. Denies drug use. She denies fever, chills, congestion, shortness of breath, chest pain, pelvic pain, dysuria, gross hematuria, urinary frequency, vaginal discharge, back pain.  She is currently on menses cycle. She has not been sexually active x 2 months, denies history of STIs.     Past Medical History:  Diagnosis Date  . Anxiety   . Chronic back pain   . Depression   . Hyperlipidemia   . Insomnia   . Migraine headache   . Myofascial pain     Patient Active Problem List   Diagnosis Date Noted  . Hyperlipidemia 01/03/2020  . Genital herpes 03/27/2019  .  Depression 01/11/2017  . Insomnia 01/11/2017  . Morbid obesity due to excess calories ([place]) 01/07/2016  . Vitamin D deficiency 03/10/2015  . Myofascial pain 03/04/2015  . Chronic back pain 03/04/2015  . Anxiety 10/02/2013  . Fibromyalgia 05/27/2013    Past Surgical History:  Procedure Laterality Date  . TUBAL LIGATION       OB History   No obstetric history on file.     Family History  Problem Relation Age of Onset  . Hypertension Paternal Grandmother   . CVA Paternal Grandmother   . Alzheimer's disease Paternal Grandmother   . Cancer Paternal Grandfather        Pancreatic  . Pancreatic cancer Maternal Grandfather     Social History   Tobacco Use  . Smoking status: Former Smoker    Packs/day: 0.25    Years: 3.00    Pack years: 0.75    Types: Cigarettes    Start date: 08/13/1995    Quit date: 09/27/1998    Years since quitting: 22.1  . Smokeless tobacco: Never Used  . Tobacco comment: quit in 1999  Substance Use Topics  . Alcohol use: Yes    Alcohol/week: 4.0 standard drinks    Types: 4 Shots of liquor per week  . Drug use: No    Home Medications Prior to Admission medications   Medication Sig Start Date End Date Taking? Authorizing Provider  acetaminophen (TYLENOL) 500 MG tablet Take 1,000 mg by mouth every 6 (six) hours as needed for moderate pain.   Yes [provider]  ALPRAZolam (XANAX) 1 MG tablet TAKE 1 TABLET EVERY DAY AS NEEDED FOR ANXIETY Patient taking differently: Take 1 mg by mouth daily as needed for anxiety.  09/16/20  Yes Isaac Bliss, Rayford Halsted, MD  atorvastatin (LIPITOR) 10 MG tablet TAKE 1 TABLET BY MOUTH EVERY DAY Patient taking differently: Take 10 mg by mouth daily.  07/21/20  Yes Isaac Bliss, Rayford Halsted, MD  citalopram (CELEXA) 40 MG tablet Take 1 tablet (40 mg total) by mouth daily. 06/30/20  Yes Isaac Bliss, Rayford Halsted, MD  cyclobenzaprine (FLEXERIL) 10 MG tablet TAKE 1 TABLET BY MOUTH AT BEDTIME AS NEEDED FOR  SPASMS Patient taking differently: Take 10 mg by mouth daily as needed for muscle spasms.  09/16/20  Yes Isaac Bliss, Rayford Halsted, MD  valACYclovir (VALTREX) 1000 MG tablet TAKE 1 TABLET BY MOUTH EVERY DAY Patient taking differently: Take 1,000 mg by mouth daily.  07/21/20  Yes Isaac Bliss, Rayford Halsted, MD  zolpidem (AMBIEN) 10 MG tablet Take 1 tablet (10 mg total) by mouth at bedtime as needed for sleep. 07/07/20  Yes Isaac Bliss, Rayford Halsted, MD  doxycycline (VIBRAMYCIN) 100 MG capsule Take 1 capsule (100 mg total) by mouth 2 (two) times daily for 14 days. 10/29/20 11/12/20  Barrie Folk, PA-C  HYDROcodone-acetaminophen (NORCO/VICODIN) 5-325 MG tablet Take 2 tablets by mouth every 6 (six) hours as needed for severe pain. 10/28/20   Walisiewicz, Teodor Prater E, PA-C  ondansetron (ZOFRAN ODT) 4 MG disintegrating tablet Take 1 tablet (4 mg total) by mouth every 8 (eight) hours as needed for nausea or vomiting. 10/28/20   Barrie Folk, PA-C    Allergies    Patient has no known allergies.  Review of Systems   Review of Systems All other systems are reviewed and are negative for acute change except as noted in the HPI.  Physical Exam Updated Vital Signs BP (!) 154/103 (BP Location: Left Arm)   Pulse 93   Temp 97.9 F (36.6 C) (Oral)   Resp 16   LMP 10/28/2020   SpO2 95%   Physical Exam Vitals and nursing note reviewed.  Constitutional:      General: She is not in acute distress.    Appearance: She is not ill-appearing.     Comments: Uncomfortable appearing  HENT:     Head: Normocephalic and atraumatic.     Right Ear: Tympanic membrane and external ear normal.     Left Ear: Tympanic membrane and external ear normal.     Nose: Nose normal.     Mouth/Throat:     Mouth: Mucous membranes are dry.     Pharynx: Oropharynx is clear.  Eyes:     General: No scleral icterus.       Right eye: No discharge.        Left eye: No discharge.     Extraocular Movements:  Extraocular movements intact.     Conjunctiva/sclera: Conjunctivae normal.     Pupils: Pupils are equal, round, and reactive to light.  Neck:     Vascular: No JVD.  Cardiovascular:     Rate and Rhythm: Normal rate and regular rhythm.     Pulses: Normal pulses.          Radial pulses are 2+ on the right side and 2+ on the left side.     Heart sounds: Normal heart sounds.  Pulmonary:     Comments: Lungs clear to auscultation in all fields. Symmetric chest rise. No wheezing, rales, or rhonchi. Abdominal:  General: Bowel sounds are normal.     Tenderness: There is no right CVA tenderness or left CVA tenderness.     Comments: Abdomen is soft, non-distended, with diffuse abdominal tenderness. No rigidity, no guarding. No peritoneal signs.  Genitourinary:    Comments: Normal external genitalia. No pain with speculum insertion. Closed cervical os with normal appearance - no rash or lesions. No significant discharge or bleeding noted from cervix or in vaginal vault. On bimanual examination mild right adnexal tenderness, no cervical motion tenderness. Chaperone Melissa EMT present during exam.  Musculoskeletal:        General: Normal range of motion.     Cervical back: Normal range of motion.  Skin:    General: Skin is warm and dry.     Capillary Refill: Capillary refill takes less than 2 seconds.  Neurological:     Mental Status: She is oriented to person, place, and time.     GCS: GCS eye subscore is 4. GCS verbal subscore is 5. GCS motor subscore is 6.     Comments: Fluent speech, no facial droop.  Psychiatric:        Behavior: Behavior normal.     ED Results / Procedures / Treatments   Labs (all labs ordered are listed, but only abnormal results are displayed) Labs Reviewed  COMPREHENSIVE METABOLIC PANEL - Abnormal; Notable for the following components:      Result Value   Potassium 3.1 (*)    Glucose, Bld 108 (*)    Calcium 8.6 (*)    AST 12 (*)    All other components  within normal limits  CBC WITH DIFFERENTIAL/PLATELET - Abnormal; Notable for the following components:   WBC 16.1 (*)    Neutro Abs 14.1 (*)    Abs Immature Granulocytes 0.09 (*)    All other components within normal limits  URINALYSIS, ROUTINE W REFLEX MICROSCOPIC - Abnormal; Notable for the following components:   Specific Gravity, Urine >1.046 (*)    Hgb urine dipstick LARGE (*)    Ketones, ur 20 (*)    All other components within normal limits  WET PREP, GENITAL  LIPASE, BLOOD  RPR  HIV ANTIBODY (ROUTINE TESTING W REFLEX)  I-STAT BETA HCG BLOOD, ED ([facility], [facility], [facility] ONLY)  GC/CHLAMYDIA PROBE AMP ([facility]) NOT AT [facility]    EKG None  Radiology CT ABDOMEN PELVIS W CONTRAST  Result Date: 10/28/2020 CLINICAL DATA:  Diffuse abdominal pain. EXAM: CT ABDOMEN AND PELVIS WITH CONTRAST TECHNIQUE: Multidetector CT imaging of the abdomen and pelvis was performed using the standard protocol following bolus administration of intravenous contrast. CONTRAST:  127mL OMNIPAQUE IOHEXOL 300 MG/ML  SOLN COMPARISON:  None. FINDINGS: Lower chest: No acute abnormality. Hepatobiliary: A 7 mm well-defined focus of parenchymal low attenuation is seen within the posterolateral aspect of the right lobe the liver. No gallstones, gallbladder wall thickening, or biliary dilatation. Pancreas: Unremarkable. No pancreatic ductal dilatation or surrounding inflammatory changes. Spleen: Normal in size without focal abnormality. Adrenals/Urinary Tract: Adrenal glands are unremarkable. Kidneys are normal, without renal calculi, focal lesion, or hydronephrosis. Bladder is unremarkable. Stomach/Bowel: Stomach is within normal limits. No evidence of bowel dilatation. Numerous diverticula are seen throughout the large bowel. Mild thickening of a short segment of proximal sigmoid colon is also seen (axial CT images 69 through 74, CT series number 2). The appendix is not clearly identified. A 6 mm thick curvilinear area of soft tissue  attenuation is seen within the posteromedial aspect of the right lower  quadrant. This appears to extend from the posterior aspect of the cecum, toward the midline of the posterior pelvis (axial CT images 63 through 69, CT series number 2/sagittal reformatted images 99 through 121, CT series number 6). A 1.0 cm focus of air is seen within the anterior aspect of the right lower quadrant (axial CT image 60, CT series number 2). This is adjacent to the anteromedial aspect of the cecum. Vascular/Lymphatic: There is mild calcification of the abdominal aorta and bilateral common iliac arteries, without evidence of aneurysmal dilatation. No enlarged abdominal or pelvic lymph nodes. Reproductive: The uterine fundus is enlarged and heterogeneous in appearance. A 2.7 cm x 1.7 cm cyst is seen within the anterior aspect of the left adnexa. A mild amount of mesenteric inflammatory fat stranding is seen within the medial aspect of the right lower quadrant. This extends to the right adnexa. A 1.0 cm diameter fluid-filled tubular appearing area is seen within this region which may represent a dilated fallopian tube (axial CT image 69, CT series number 2). An adjacent 3.9 cm x 2.3 cm well-defined area of fluid attenuation is seen, posteriorly (axial CT images 64 through 73, CT series number 2). Other: No abdominal wall hernia or abnormality. No abdominopelvic ascites. Musculoskeletal: No acute or significant osseous findings. IMPRESSION: 1. Inflammatory process within the right lower quadrant/right hemipelvis which is suspicious for pelvic inflammatory disease with an associated right-sided tubo-ovarian abscess. The presence of an inflamed, perforated appendix cannot completely be excluded. Correlation with pelvic ultrasound is recommended. 2. Colonic diverticulosis. 3. Mild thickening of a short segment of proximal sigmoid colon which may be secondary to poor bowel distention. Correlation with follow-up abdomen pelvis CT is  recommended to exclude the presence of a soft tissue mass is recommended. 4. Enlarged, heterogeneous uterine fundus, which may represent a fibroid uterus. 5. Left adnexal cyst, likely ovarian in origin. 6. Small hepatic cyst versus hemangioma. 7. Aortic atherosclerosis. Aortic Atherosclerosis (ICD10-I70.0). Electronically Signed   By: Virgina Norfolk M.D.   On: 10/28/2020 19:35   US PELVIC COMPLETE W TRANSVAGINAL AND TORSION R/O  Result Date: 10/28/2020 CLINICAL DATA:  Pelvic pain EXAM: TRANSABDOMINAL AND TRANSVAGINAL ULTRASOUND OF PELVIS DOPPLER ULTRASOUND OF OVARIES TECHNIQUE: Both transabdominal and transvaginal ultrasound examinations of the pelvis were performed. Transabdominal technique was performed for global imaging of the pelvis including uterus, ovaries, adnexal regions, and pelvic cul-de-sac. It was necessary to proceed with endovaginal exam following the transabdominal exam to visualize the ovaries. Color and duplex Doppler ultrasound was utilized to evaluate blood flow to the ovaries. COMPARISON:  CT from the same day FINDINGS: Uterus Measurements: 8.9 x 6.5 x 8.3 cm = volume: 250 mL. There is a uterine fibroid measuring 5.9 x 4.6 x 6.4 cm. Endometrium Thickness: 5.6 mm.  No focal abnormality visualized. Right ovary Measurements: 7.8 x 4.7 x 5.5 cm = volume: 105 mL. There is a complex cystic mass measuring 3.8 x 3.5 x 3.9 cm. This mass demonstrates lace-like echogenic foci. In the region the right adnexa there is a tubular fluid-filled structure Left ovary Measurements: 3.6 x 2.4 x 3 cm = volume: 13 mL. Normal appearance/no adnexal mass. Pulsed Doppler evaluation of both ovaries demonstrates normal low-resistance arterial and venous waveforms. Other findings No abnormal free fluid. IMPRESSION: 1. Overall findings are most consistent with PID with a right-sided pyosalpinx. 2. No evidence for ovarian torsion. 3. Fibroid uterus. Electronically Signed   By: Constance Holster M.D.   On: 10/28/2020  20:56    Procedures  Procedures (including critical care time)  Medications Ordered in ED Medications  potassium chloride SA (KLOR-CON) CR tablet 40 mEq (has no administration in time range)  doxycycline (VIBRA-TABS) tablet 100 mg (has no administration in time range)  cefTRIAXone (ROCEPHIN) 1 g in sodium chloride 0.9 % 100 mL IVPB (has no administration in time range)  ondansetron (ZOFRAN) injection 4 mg (has no administration in time range)  morphine 4 MG/ML injection 4 mg (4 mg Intravenous Given 10/28/20 1703)  sodium chloride 0.9 % bolus 1,000 mL (0 mLs Intravenous Stopped 10/28/20 1851)  ondansetron (ZOFRAN) injection 4 mg (4 mg Intravenous Given 10/28/20 1704)  iohexol (OMNIPAQUE) 300 MG/ML solution 100 mL (100 mLs Intravenous Contrast Given 10/28/20 1815)  morphine 4 MG/ML injection 4 mg (4 mg Intravenous Given 10/28/20 2055)    ED Course  I have reviewed the triage vital signs and the nursing notes.  Pertinent labs & imaging results that were available during my care of the patient were reviewed by me and considered in my medical decision making (see chart for details).    MDM Rules/Calculators/A&P                          History provided by patient with additional history obtained from chart review.    Patient presents to the ED with complaints of abdominal pain. Patient nontoxic appearing, in no apparent distress, vitals WNL. On exam patient with diffuse abdominal tenderness, no peritoneal signs. Will evaluate with labs and CT AP. Analgesics, anti-emetics, and fluids administered.  CBC shows leukocytosis of 16.1, no anemia.  CMP with mild hypokalemia 3.1, repleted with p.o. potassium, no other significant electrolyte derangements. LFTs, renal function, and lipase WNL. Urinalysis without obvious infection, does have ketones suggesting dehydration and large hemoglobinuria consistent with patient currently menstruating.  Pregnancy test negative.  CT AP with multiple  findings:  1.Inflammatory process within the right lower quadrant/right hemipelvis which is suspicious for pelvic inflammatory disease with an associated right-sided tubo-ovarian abscess. The presence of an inflamed, perforated appendix cannot completely be excluded. Correlation with pelvic ultrasound is recommended. 2. Colonic diverticulosis. 3. Mild thickening of a short segment of proximal sigmoid colon which may be secondary to poor bowel distention. Correlation with follow-up abdomen pelvis CT is recommended to exclude the presence of a soft tissue mass is recommended. 4. Enlarged, heterogeneous uterine fundus, which may represent a fibroid uterus. 5. Left adnexal cyst, likely ovarian in origin. 6. Small hepatic cyst versus hemangioma. Patient informed of findings. Pelvic US obtained and shows PID with a right-sided pyosalpinx, no ovarian torsion. Also has fibroids. Pelvic exam with chaperone present. On exam patient has right adnexal tenderness, no cervical motion tenderness. HIV and RPR collected. Wet prep with negative results. On repeat abdominal exam patient remains without peritoneal signs.  Patient tolerating PO in the emergency department. Patient given Rocephin and doxycyline. Engaged in shared decision making, patient feels she can tolerate her symptoms at home. Will discharge with prescription for doxycyline for treatment of PID. Will also prescribe zofran and norco for severe pain. I have reviewed the PDMP during this encounter. She has not current narcotic prescription.  She knows to follow up with her gyn and pcp to discuss CT findings and possible repeat CT if needed. I discussed results, treatment plan, need for PCP follow-up, and return precautions with the patient. Provided opportunity for questions, patient confirmed understanding and is in agreement with plan. Findings and plan of care discussed with  supervising physician Dr. Alvino Chapel.  Portions of this note were generated with Geographical information systems officer. Dictation errors may occur despite best attempts at proofreading.   Final Clinical Impression(s) / ED Diagnoses Final diagnoses:  Pelvic inflammatory disease, acute    Rx / DC Orders ED Discharge Orders         Ordered    doxycycline (VIBRAMYCIN) 100 MG capsule  2 times daily        10/28/20 2140    ondansetron (ZOFRAN ODT) 4 MG disintegrating tablet  Every 8 hours PRN        10/28/20 2140    HYDROcodone-acetaminophen (NORCO/VICODIN) 5-325 MG tablet  Every 6 hours PRN        10/28/20 2202           Barrie Folk, PA-C 10/28/20 2202    Davonna Belling, MD 10/29/20 [phone]

## 2020-10-29 LAB — HIV ANTIBODY (ROUTINE TESTING W REFLEX): HIV Screen 4th Generation wRfx: NONREACTIVE

## 2020-10-29 LAB — RPR: RPR Ser Ql: NONREACTIVE

## 2020-10-30 LAB — GC/CHLAMYDIA PROBE AMP (~~LOC~~) NOT AT ARMC
Chlamydia: NEGATIVE
Comment: NEGATIVE
Comment: NORMAL
Neisseria Gonorrhea: NEGATIVE

## 2020-11-06 ENCOUNTER — Other Ambulatory Visit: Payer: Self-pay

## 2020-11-06 ENCOUNTER — Encounter: Payer: Self-pay | Admitting: Internal Medicine

## 2020-11-06 ENCOUNTER — Inpatient Hospital Stay (HOSPITAL_COMMUNITY)
Admission: EM | Admit: 2020-11-06 | Discharge: 2020-11-09 | DRG: 392 | Disposition: A | Discharge status: Home / Self Care | Payer: BC Managed Care – PPO | Attending: General Surgery | Admitting: General Surgery

## 2020-11-06 ENCOUNTER — Encounter (HOSPITAL_COMMUNITY): Payer: Self-pay

## 2020-11-06 ENCOUNTER — Ambulatory Visit (INDEPENDENT_AMBULATORY_CARE_PROVIDER_SITE_OTHER)
Admission: RE | Admit: 2020-11-06 | Discharge: 2020-11-06 | Disposition: A | Discharge status: Home / Self Care | Payer: BC Managed Care – PPO | Source: Ambulatory Visit | Attending: Internal Medicine | Admitting: Internal Medicine

## 2020-11-06 ENCOUNTER — Ambulatory Visit (INDEPENDENT_AMBULATORY_CARE_PROVIDER_SITE_OTHER): Payer: BC Managed Care – PPO | Admitting: Internal Medicine

## 2020-11-06 VITALS — BP 110/80 | HR 95 | Temp 98.1°F | Wt 224.2 lb

## 2020-11-06 DIAGNOSIS — E785 Hyperlipidemia, unspecified: Secondary | ICD-10-CM | POA: Diagnosis present

## 2020-11-06 DIAGNOSIS — M797 Fibromyalgia: Secondary | ICD-10-CM | POA: Diagnosis present

## 2020-11-06 DIAGNOSIS — G8929 Other chronic pain: Secondary | ICD-10-CM | POA: Diagnosis not present

## 2020-11-06 DIAGNOSIS — N7093 Salpingitis and oophoritis, unspecified: Secondary | ICD-10-CM

## 2020-11-06 DIAGNOSIS — E876 Hypokalemia: Secondary | ICD-10-CM | POA: Diagnosis present

## 2020-11-06 DIAGNOSIS — F32A Depression, unspecified: Secondary | ICD-10-CM | POA: Diagnosis present

## 2020-11-06 DIAGNOSIS — N730 Acute parametritis and pelvic cellulitis: Secondary | ICD-10-CM | POA: Diagnosis not present

## 2020-11-06 DIAGNOSIS — G4700 Insomnia, unspecified: Secondary | ICD-10-CM | POA: Diagnosis not present

## 2020-11-06 DIAGNOSIS — K6811 Postprocedural retroperitoneal abscess: Secondary | ICD-10-CM | POA: Diagnosis not present

## 2020-11-06 DIAGNOSIS — R1031 Right lower quadrant pain: Secondary | ICD-10-CM

## 2020-11-06 DIAGNOSIS — K651 Peritoneal abscess: Secondary | ICD-10-CM | POA: Diagnosis not present

## 2020-11-06 DIAGNOSIS — Z87891 Personal history of nicotine dependence: Secondary | ICD-10-CM

## 2020-11-06 DIAGNOSIS — F419 Anxiety disorder, unspecified: Secondary | ICD-10-CM | POA: Diagnosis not present

## 2020-11-06 DIAGNOSIS — K5720 Diverticulitis of large intestine with perforation and abscess without bleeding: Secondary | ICD-10-CM | POA: Diagnosis not present

## 2020-11-06 DIAGNOSIS — Z79899 Other long term (current) drug therapy: Secondary | ICD-10-CM | POA: Diagnosis not present

## 2020-11-06 DIAGNOSIS — B009 Herpesviral infection, unspecified: Secondary | ICD-10-CM | POA: Diagnosis not present

## 2020-11-06 DIAGNOSIS — E860 Dehydration: Secondary | ICD-10-CM | POA: Diagnosis present

## 2020-11-06 DIAGNOSIS — N739 Female pelvic inflammatory disease, unspecified: Secondary | ICD-10-CM | POA: Diagnosis not present

## 2020-11-06 DIAGNOSIS — Z09 Encounter for follow-up examination after completed treatment for conditions other than malignant neoplasm: Secondary | ICD-10-CM

## 2020-11-06 DIAGNOSIS — Z978 Presence of other specified devices: Secondary | ICD-10-CM | POA: Diagnosis not present

## 2020-11-06 DIAGNOSIS — Z20822 Contact with and (suspected) exposure to covid-19: Secondary | ICD-10-CM | POA: Diagnosis not present

## 2020-11-06 LAB — COMPREHENSIVE METABOLIC PANEL
ALT: 8 U/L (ref 0–44)
AST: 10 U/L — ABNORMAL LOW (ref 15–41)
Albumin: 3.4 g/dL — ABNORMAL LOW (ref 3.5–5.0)
Alkaline Phosphatase: 96 U/L (ref 38–126)
Anion gap: 11 (ref 5–15)
BUN: 7 mg/dL (ref 6–20)
CO2: 27 mmol/L (ref 22–32)
Calcium: 8.7 mg/dL — ABNORMAL LOW (ref 8.9–10.3)
Chloride: 101 mmol/L (ref 98–111)
Creatinine, Ser: 0.9 mg/dL (ref 0.44–1.00)
GFR, Estimated: >60 mL/min (ref >60)
Glucose, Bld: 98 mg/dL (ref 70–99)
Potassium: 3.2 mmol/L — ABNORMAL LOW (ref 3.5–5.1)
Sodium: 139 mmol/L (ref 135–145)
Total Bilirubin: 0.3 mg/dL (ref 0.3–1.2)
Total Protein: 7.9 g/dL (ref 6.5–8.1)

## 2020-11-06 LAB — URINALYSIS, ROUTINE W REFLEX MICROSCOPIC
Bacteria, UA: NONE SEEN
Bilirubin Urine: NEGATIVE
Glucose, UA: NEGATIVE mg/dL
Hgb urine dipstick: NEGATIVE
Ketones, ur: NEGATIVE mg/dL
Leukocytes,Ua: NEGATIVE
Nitrite: NEGATIVE
Protein, ur: 30 mg/dL — AB
Specific Gravity, Urine: 1.01 (ref 1.005–1.030)
pH: 5 (ref 5.0–8.0)

## 2020-11-06 LAB — RESP PANEL BY RT-PCR (FLU A&B, COVID) ARPGX2
Influenza A by PCR: NEGATIVE
Influenza B by PCR: NEGATIVE
SARS Coronavirus 2 by RT PCR: NEGATIVE

## 2020-11-06 LAB — CBC
HCT: 39 % (ref 36.0–46.0)
Hemoglobin: 12.6 g/dL (ref 12.0–15.0)
MCH: 29.8 pg (ref 26.0–34.0)
MCHC: 32.3 g/dL (ref 30.0–36.0)
MCV: 92.2 fL (ref 80.0–100.0)
Platelets: 434 10*3/uL — ABNORMAL HIGH (ref 150–400)
RBC: 4.23 MIL/uL (ref 3.87–5.11)
RDW: 14.2 % (ref 11.5–15.5)
WBC: 15.2 10*3/uL — ABNORMAL HIGH (ref 4.0–10.5)
nRBC: 0 % (ref 0.0–0.2)

## 2020-11-06 LAB — I-STAT BETA HCG BLOOD, ED (MC, WL, AP ONLY): I-stat hCG, quantitative: <5 m[IU]/mL (ref <5)

## 2020-11-06 LAB — LIPASE, BLOOD: Lipase: 23 U/L (ref 11–51)

## 2020-11-06 MED ORDER — HYDROMORPHONE HCL 1 MG/ML IJ SOLN
1.0000 mg | Freq: Once | INTRAMUSCULAR | Status: AC
  Administered 2020-11-06: 1 mg via INTRAVENOUS
  Filled 2020-11-06: qty 1

## 2020-11-06 MED ORDER — DOXYCYCLINE HYCLATE 100 MG PO TABS
100.0000 mg | ORAL_TABLET | Freq: Two times a day (BID) | ORAL | Status: DC
  Administered 2020-11-06 – 2020-11-07 (×2): 100 mg via ORAL
  Filled 2020-11-06 (×2): qty 1

## 2020-11-06 MED ORDER — SODIUM CHLORIDE 0.9% FLUSH: 3.0000 mL | INTRAVENOUS | Status: DC | PRN

## 2020-11-06 MED ORDER — ATORVASTATIN CALCIUM 10 MG PO TABS: 10.0000 mg | ORAL_TABLET | Freq: Every day | ORAL | 1 refills | Status: DC

## 2020-11-06 MED ORDER — IOHEXOL 300 MG/ML  SOLN
100.0000 mL | Freq: Once | INTRAMUSCULAR | Status: AC | PRN
  Administered 2020-11-06: 100 mL via INTRAVENOUS

## 2020-11-06 MED ORDER — SODIUM CHLORIDE 0.9 % IV SOLN: 250.0000 mL | INTRAVENOUS | Status: DC | PRN

## 2020-11-06 MED ORDER — SODIUM CHLORIDE 0.9 % IV SOLN
2.0000 g | Freq: Four times a day (QID) | INTRAVENOUS | Status: DC
  Administered 2020-11-06: 2 g via INTRAVENOUS
  Filled 2020-11-06 (×5): qty 2

## 2020-11-06 MED ORDER — POLYETHYLENE GLYCOL 3350 17 G PO PACK: 17.0000 g | PACK | Freq: Every day | ORAL | Status: DC | PRN

## 2020-11-06 MED ORDER — KETOROLAC TROMETHAMINE 60 MG/2ML IM SOLN
60.0000 mg | Freq: Once | INTRAMUSCULAR | Status: AC
  Administered 2020-11-06: 60 mg via INTRAMUSCULAR

## 2020-11-06 MED ORDER — PRENATAL MULTIVITAMIN CH
1.0000 | ORAL_TABLET | Freq: Every day | ORAL | Status: DC
  Administered 2020-11-07 – 2020-11-09 (×3): 1 via ORAL
  Filled 2020-11-06 (×3): qty 1

## 2020-11-06 MED ORDER — BISACODYL 5 MG PO TBEC: 5.0000 mg | DELAYED_RELEASE_TABLET | Freq: Every day | ORAL | Status: DC | PRN

## 2020-11-06 MED ORDER — ONDANSETRON HCL 4 MG PO TABS: 4.0000 mg | ORAL_TABLET | Freq: Four times a day (QID) | ORAL | Status: DC | PRN

## 2020-11-06 MED ORDER — POTASSIUM CHLORIDE CRYS ER 20 MEQ PO TBCR
40.0000 meq | EXTENDED_RELEASE_TABLET | Freq: Once | ORAL | Status: AC
  Administered 2020-11-06: 40 meq via ORAL
  Filled 2020-11-06: qty 2

## 2020-11-06 MED ORDER — OXYCODONE-ACETAMINOPHEN 5-325 MG PO TABS
1.0000 | ORAL_TABLET | ORAL | Status: DC | PRN
  Administered 2020-11-06 – 2020-11-07 (×4): 2 via ORAL
  Administered 2020-11-07: 1 via ORAL
  Administered 2020-11-07 – 2020-11-09 (×7): 2 via ORAL
  Filled 2020-11-06: qty 2
  Filled 2020-11-06: qty 1
  Filled 2020-11-06 (×10): qty 2

## 2020-11-06 MED ORDER — ONDANSETRON HCL 4 MG/2ML IJ SOLN
4.0000 mg | Freq: Once | INTRAMUSCULAR | Status: AC
  Administered 2020-11-06: 4 mg via INTRAVENOUS
  Filled 2020-11-06: qty 2

## 2020-11-06 MED ORDER — SIMETHICONE 80 MG PO CHEW
80.0000 mg | CHEWABLE_TABLET | Freq: Four times a day (QID) | ORAL | Status: DC | PRN
  Filled 2020-11-06: qty 1

## 2020-11-06 MED ORDER — SODIUM CHLORIDE 0.9% FLUSH
3.0000 mL | Freq: Two times a day (BID) | INTRAVENOUS | Status: DC
  Administered 2020-11-06 – 2020-11-07 (×3): 3 mL via INTRAVENOUS

## 2020-11-06 MED ORDER — ONDANSETRON HCL 4 MG/2ML IJ SOLN
4.0000 mg | Freq: Four times a day (QID) | INTRAMUSCULAR | Status: DC | PRN
  Administered 2020-11-07 – 2020-11-08 (×2): 4 mg via INTRAVENOUS
  Filled 2020-11-06 (×2): qty 2

## 2020-11-06 MED ORDER — IBUPROFEN 600 MG PO TABS
600.0000 mg | ORAL_TABLET | Freq: Four times a day (QID) | ORAL | Status: DC | PRN
  Administered 2020-11-08: 600 mg via ORAL
  Filled 2020-11-06: qty 1

## 2020-11-06 NOTE — Progress Notes (Signed)
MD at bedside to see patient

## 2020-11-06 NOTE — Progress Notes (Addendum)
Established Patient Office Visit     This visit occurred during the SARS-CoV-2 public health emergency.  Safety protocols were in place, including screening questions prior to the visit, additional usage of staff PPE, and extensive cleaning of exam room while observing appropriate contact time as indicated for disinfecting solutions.    CC/Reason for Visit: Emergency room follow-up  HPI: Barbara Yates is a 49 y.o. female who is coming in today for the above mentioned reasons.  She was seen in the emergency department on November 24 for severe abdominal pain nausea and vomiting.  CT of abdomen and pelvis was done that showed an inflammatory process in the right lower quadrant that was suspicious for pelvic inflammatory disease with an associated right tubo-ovarian abscess.  Due to degree of inflammation the presence of an inflamed, perforated appendix cannot be completely excluded.  She subsequently had a pelvic ultrasound that did show pelvic inflammatory disease with a right sided pyosalpinx with no ovarian torsion.  Patient had wet prep and STD screening that was negative.  She was given Rocephin and sent home with 2 weeks of doxycycline.  She is on day #9.  She continues to have significant abdominal pain.  She is now presenting with increased peritoneal signs.  She is walking hunched over.  She is unable to walk on tiptoes.  She has significant voluntary abdominal guarding.  Abdomen is very tender to palpation to the right lower quadrant.  She has been taking Tylenol and ibuprofen every 4 hours which is not really helping with pain.  She is not having any fever.  No vomiting although she continues to be nauseous.   Past Medical/Surgical History: Past Medical History:  Diagnosis Date  . Anxiety   . Chronic back pain   . Depression   . Hyperlipidemia   . Insomnia   . Migraine headache   . Myofascial pain     Past Surgical History:  Procedure Laterality Date  . TUBAL LIGATION       Social History:  reports that she quit smoking about 22 years ago. Her smoking use included cigarettes. She started smoking about 25 years ago. She has a 0.75 pack-year smoking history. She has never used smokeless tobacco. She reports current alcohol use of about 4.0 standard drinks of alcohol per week. She reports that she does not use drugs.  Allergies: No Known Allergies  Family History:  Family History  Problem Relation Age of Onset  . Hypertension Paternal Grandmother   . CVA Paternal Grandmother   . Alzheimer's disease Paternal Grandmother   . Cancer Paternal Grandfather        Pancreatic  . Pancreatic cancer Maternal Grandfather      Current Outpatient Medications:  .  acetaminophen (TYLENOL) 500 MG tablet, Take 1,000 mg by mouth every 6 (six) hours as needed for moderate pain., Disp: , Rfl:  .  ALPRAZolam (XANAX) 1 MG tablet, TAKE 1 TABLET EVERY DAY AS NEEDED FOR ANXIETY (Patient taking differently: Take 1 mg by mouth daily as needed for anxiety. ), Disp: 30 tablet, Rfl: 0 .  atorvastatin (LIPITOR) 10 MG tablet, Take 1 tablet (10 mg total) by mouth daily., Disp: 90 tablet, Rfl: 1 .  citalopram (CELEXA) 40 MG tablet, Take 1 tablet (40 mg total) by mouth daily., Disp: 90 tablet, Rfl: 1 .  cyclobenzaprine (FLEXERIL) 10 MG tablet, TAKE 1 TABLET BY MOUTH AT BEDTIME AS NEEDED FOR SPASMS (Patient taking differently: Take 10 mg by mouth daily  as needed for muscle spasms. ), Disp: 20 tablet, Rfl: 0 .  doxycycline (VIBRAMYCIN) 100 MG capsule, Take 1 capsule (100 mg total) by mouth 2 (two) times daily for 14 days., Disp: 28 capsule, Rfl: 0 .  ondansetron (ZOFRAN ODT) 4 MG disintegrating tablet, Take 1 tablet (4 mg total) by mouth every 8 (eight) hours as needed for nausea or vomiting., Disp: 12 tablet, Rfl: 0 .  valACYclovir (VALTREX) 1000 MG tablet, TAKE 1 TABLET BY MOUTH EVERY DAY (Patient taking differently: Take 1,000 mg by mouth daily. ), Disp: 90 tablet, Rfl: 1 .  zolpidem  (AMBIEN) 10 MG tablet, Take 1 tablet (10 mg total) by mouth at bedtime as needed for sleep., Disp: 15 tablet, Rfl: 1 .  HYDROcodone-acetaminophen (NORCO/VICODIN) 5-325 MG tablet, Take 2 tablets by mouth every 6 (six) hours as needed for severe pain. (Patient not taking: Reported on 11/06/2020), Disp: 8 tablet, Rfl: 0  Current Facility-Administered Medications:  .  ketorolac (TORADOL) injection 60 mg, 60 mg, Intramuscular, Once, Isaac Bliss, Rayford Halsted, MD  Review of Systems:  Constitutional: Denies fever, chills, diaphoresis, appetite change and fatigue.  HEENT: Denies photophobia, eye pain, redness, hearing loss, ear pain, congestion, sore throat, rhinorrhea, sneezing, mouth sores, trouble swallowing, neck pain, neck stiffness and tinnitus.   Respiratory: Denies SOB, DOE, cough, chest tightness,  and wheezing.   Cardiovascular: Denies chest pain, palpitations and leg swelling.  Gastrointestinal: Denies nausea, vomiting,  diarrhea, constipation, blood in stool and abdominal distention.  Genitourinary: Denies dysuria, urgency, frequency, hematuria, flank pain and difficulty urinating.  Endocrine: Denies: hot or cold intolerance, sweats, changes in hair or nails, polyuria, polydipsia. Musculoskeletal: Denies myalgias, back pain, joint swelling, arthralgias and gait problem.  Skin: Denies pallor, rash and wound.  Neurological: Denies dizziness, seizures, syncope, weakness, light-headedness, numbness and headaches.  Hematological: Denies adenopathy. Easy bruising, personal or family bleeding history  Psychiatric/Behavioral: Denies suicidal ideation, mood changes, confusion, nervousness, sleep disturbance and agitation    Physical Exam: Vitals:   11/06/20 0822  BP: 110/80  Pulse: 95  Temp: 98.1 F (36.7 C)  TempSrc: Oral  SpO2: 99%  Weight: 224 lb 3.2 oz (101.7 kg)    Body mass index is 33.11 kg/m.   Constitutional: In moderate distress due to abdominal pain.  I have assessed her  while ambulating down the hallway and she is hunched over grabbing her right lower quadrant. Eyes: PERRL, lids and conjunctivae normal ENMT: Mucous membranes are moist. Abdomen: Very tender to palpation to the right lower quadrant, voluntary guarding, abdomen does appear relatively soft. Neurologic: Grossly intact and nonfocal Psychiatric: Normal judgment and insight. Alert and oriented x 3. Normal mood.    Impression and Plan:  Hospital discharge follow-up  Hyperlipidemia, unspecified hyperlipidemia type - Plan: atorvastatin (LIPITOR) 10 MG tablet  PID (acute pelvic inflammatory disease) - Plan: ketorolac (TORADOL) injection 60 mg, CT Abdomen Pelvis W Contrast  TOA (tubo-ovarian abscess) - Plan: ketorolac (TORADOL) injection 60 mg, CT Abdomen Pelvis W Contrast  -9 days into antibiotic therapy I would have expected more improvement in her symptoms, if anything they have gotten worse. -Since appendicitis was not able to be fully ruled out, I feel it is important that we redo another stat CT of the abdomen for further evaluation. -She has been instructed to complete out her course of doxycycline, I will also send out an urgent GYN referral for evaluation of her PID with TOA. -Due to significant pain 60 mg of IM Toradol to be given in  office today.   Patient Instructions  -Nice seeing you today!!  -Toradol injection today for pain.  -Will send for repeat CT scan as well as GYN referral.     Lelon Frohlich, MD [facility]

## 2020-11-06 NOTE — ED Provider Notes (Signed)
[facility] DEPT Provider Note   CSN: [id number] Arrival date & time: 11/06/20  1614     History Chief Complaint  Patient presents with  . abnormal CT  . Abdominal Pain    Barbara Yates is a 49 y.o. female who presents emergency department for tubo-ovarian abscess.  Patient was seen on the 24th with complaint of pelvic pain.  She is found at that time to have a right-sided hydrosalpinx and PID.  She is not sexually active.  Review of EMR shows that her gonorrhea and chlamydia probe were negative along with HIV and RPR.  Her wet prep was also clean.  Patient has been taking doxycycline as prescribed and saw her PCP for repeat imaging.  Although her pain is improved she was found to have an 8 cm right-sided tubo-ovarian abscess with worsening inflammatory stranding within the pelvis and was sent in by her PCP for IV antibiotics.  She denies fevers, chills, urinary symptoms or vomiting.  HPI     Past Medical History:  Diagnosis Date  . Anxiety   . Chronic back pain   . Depression   . Hyperlipidemia   . Insomnia   . Migraine headache   . Myofascial pain     Patient Active Problem List   Diagnosis Date Noted  . Hyperlipidemia 01/03/2020  . Genital herpes 03/27/2019  . Depression 01/11/2017  . Insomnia 01/11/2017  . Morbid obesity due to excess calories ([place]) 01/07/2016  . Vitamin D deficiency 03/10/2015  . Myofascial pain 03/04/2015  . Chronic back pain 03/04/2015  . Anxiety 10/02/2013  . Fibromyalgia 05/27/2013    Past Surgical History:  Procedure Laterality Date  . TUBAL LIGATION       OB History   No obstetric history on file.     Family History  Problem Relation Age of Onset  . Hypertension Paternal Grandmother   . CVA Paternal Grandmother   . Alzheimer's disease Paternal Grandmother   . Cancer Paternal Grandfather        Pancreatic  . Pancreatic cancer Maternal Grandfather     Social History   Tobacco Use  . Smoking  status: Former Smoker    Packs/day: 0.25    Years: 3.00    Pack years: 0.75    Types: Cigarettes    Start date: 08/13/1995    Quit date: 09/27/1998    Years since quitting: 22.1  . Smokeless tobacco: Never Used  . Tobacco comment: quit in 1999  Vaping Use  . Vaping Use: Never used  Substance Use Topics  . Alcohol use: Yes    Alcohol/week: 4.0 standard drinks    Types: 4 Shots of liquor per week  . Drug use: No    Home Medications Prior to Admission medications   Medication Sig Start Date End Date Taking? Authorizing Provider  acetaminophen (TYLENOL) 500 MG tablet Take 1,000 mg by mouth every 6 (six) hours as needed for moderate pain.    [provider]  ALPRAZolam (XANAX) 1 MG tablet TAKE 1 TABLET EVERY DAY AS NEEDED FOR ANXIETY Patient taking differently: Take 1 mg by mouth daily as needed for anxiety.  09/16/20   Isaac Bliss, Rayford Halsted, MD  atorvastatin (LIPITOR) 10 MG tablet Take 1 tablet (10 mg total) by mouth daily. 11/06/20   Isaac Bliss, Rayford Halsted, MD  citalopram (CELEXA) 40 MG tablet Take 1 tablet (40 mg total) by mouth daily. 06/30/20   Isaac Bliss, Rayford Halsted, MD  cyclobenzaprine (FLEXERIL) 10  MG tablet TAKE 1 TABLET BY MOUTH AT BEDTIME AS NEEDED FOR SPASMS Patient taking differently: Take 10 mg by mouth daily as needed for muscle spasms.  09/16/20   Isaac Bliss, Rayford Halsted, MD  doxycycline (VIBRAMYCIN) 100 MG capsule Take 1 capsule (100 mg total) by mouth 2 (two) times daily for 14 days. 10/29/20 11/12/20  Barrie Folk, PA-C  HYDROcodone-acetaminophen (NORCO/VICODIN) 5-325 MG tablet Take 2 tablets by mouth every 6 (six) hours as needed for severe pain. Patient not taking: Reported on 11/06/2020 10/28/20   Sherol Dade E, PA-C  ondansetron (ZOFRAN ODT) 4 MG disintegrating tablet Take 1 tablet (4 mg total) by mouth every 8 (eight) hours as needed for nausea or vomiting. 10/28/20   Walisiewicz, Harley Hallmark, PA-C  valACYclovir (VALTREX) 1000 MG  tablet TAKE 1 TABLET BY MOUTH EVERY DAY Patient taking differently: Take 1,000 mg by mouth daily.  07/21/20   Isaac Bliss, Rayford Halsted, MD  zolpidem (AMBIEN) 10 MG tablet Take 1 tablet (10 mg total) by mouth at bedtime as needed for sleep. 07/07/20   Isaac Bliss, Rayford Halsted, MD    Allergies    Patient has no known allergies.  Review of Systems   Review of Systems Ten systems reviewed and are negative for acute change, except as noted in the HPI.   Physical Exam Updated Vital Signs BP (!) 149/103 (BP Location: Right Arm)   Pulse 98   Temp 98.2 F (36.8 C) (Oral)   Ht 5\' 9"  (1.753 m)   Wt 101.6 kg   LMP 11/06/2020   SpO2 96%   BMI 33.09 kg/m   Physical Exam Vitals and nursing note reviewed.  Constitutional:      General: She is not in acute distress.    Appearance: She is well-developed. She is not diaphoretic.  HENT:     Head: Normocephalic and atraumatic.  Eyes:     General: No scleral icterus.    Conjunctiva/sclera: Conjunctivae normal.  Cardiovascular:     Rate and Rhythm: Normal rate and regular rhythm.     Heart sounds: Normal heart sounds. No murmur heard.  No friction rub. No gallop.   Pulmonary:     Effort: Pulmonary effort is normal. No respiratory distress.     Breath sounds: Normal breath sounds.  Abdominal:     General: Bowel sounds are normal. There is no distension.     Palpations: Abdomen is soft. There is no mass.     Tenderness: There is abdominal tenderness in the right lower quadrant, suprapubic area and left lower quadrant. There is no guarding.  Musculoskeletal:     Cervical back: Normal range of motion.  Skin:    General: Skin is warm and dry.  Neurological:     Mental Status: She is alert and oriented to person, place, and time.  Psychiatric:        Behavior: Behavior normal.     ED Results / Procedures / Treatments   Labs (all labs ordered are listed, but only abnormal results are displayed) Labs Reviewed  RESP PANEL BY RT-PCR (FLU  A&B, COVID) ARPGX2  LIPASE, BLOOD  COMPREHENSIVE METABOLIC PANEL  CBC  URINALYSIS, ROUTINE W REFLEX MICROSCOPIC  I-STAT BETA HCG BLOOD, ED ([facility], [facility], [facility] ONLY)    EKG None  Radiology CT Abdomen Pelvis W Contrast  Result Date: 11/06/2020 CLINICAL DATA:  Persistent right lower quadrant pain. History of PID and tubo-ovarian abscess. EXAM: CT ABDOMEN AND PELVIS WITH CONTRAST TECHNIQUE: Multidetector CT imaging of  the abdomen and pelvis was performed using the standard protocol following bolus administration of intravenous contrast. CONTRAST:  176mL OMNIPAQUE IOHEXOL 300 MG/ML  SOLN COMPARISON:  10/28/2020 FINDINGS: Lower chest: New trace right pleural effusion and mild right lower lobe atelectasis. Hepatobiliary: Unchanged subcentimeter hypodensity in the right hepatic lobe, too small to fully characterize. Unremarkable gallbladder. Pancreas: Unremarkable. Spleen: Unremarkable. Adrenals/Urinary Tract: Unremarkable adrenal glands. No evidence of renal mass, calculi, or hydronephrosis. Unremarkable bladder. Stomach/Bowel: The stomach is unremarkable. There is no evidence of bowel obstruction. There is diverticulosis of the descending and sigmoid colon. Wall thickening of the proximal sigmoid colon has increased with increased surrounding inflammation. A complex inflammatory process in the right adnexa has progressed from the prior CT, and there is a new 8.2 x 4.6 cm gas and fluid collection extending laterally in the right lower quadrant anterior to the cecum. The terminal ileum is displaced posteriorly and compressed by the fluid collection. Regional inflammation involves a small portion of the distal ileum. The appendix is located posterior to the fluid collection, is opacified with oral contrast material, and measures 5-6 mm in diameter. The regional inflammatory process involves the mid and distal aspects of the appendix. Vascular/Lymphatic: Mild abdominal aortic atherosclerosis without aneurysm. Mild  interval enlargement of subcentimeter short axis lymph nodes in the right lower quadrant mesentery and retroperitoneum, favored to be reactive. Reproductive: Increased size of cystic focus in the left adnexa, now 3.2 x 3.2 cm. Anterior uterine fibroid as previously seen. Other: Small volume intraperitoneal free fluid. Musculoskeletal: No acute osseous abnormality or suspicious osseous lesion. Asymmetrically advanced disc degeneration on the right at L3-4 and on the left at L4-5. IMPRESSION: 1. Interval progression of inflammatory process in the right lower quadrant/adnexa, now with an 8 cm abscess. Progressive wall thickening of the proximal sigmoid colon. Considerations include PID with tubo-ovarian abscess and perforated colonic diverticulitis. Perforated appendicitis is considered less likely. 2. Increased size of a cystic focus in the left adnexa, now 3.2 cm. 3. New trace right pleural effusion. 4. Aortic Atherosclerosis (ICD10-I70.0). These results will be called to the ordering clinician or representative by the Radiologist Assistant, and communication documented in the PACS or [employer]. Electronically Signed   By: Logan Bores M.D.   On: 11/06/2020 14:36    Procedures Procedures (including critical care time)  Medications Ordered in ED Medications  cefOXitin (MEFOXIN) 2 g in sodium chloride 0.9 % 100 mL IVPB (has no administration in time range)  doxycycline (VIBRA-TABS) tablet 100 mg (has no administration in time range)    ED Course  I have reviewed the triage vital signs and the nursing notes.  Pertinent labs & imaging results that were available during my care of the patient were reviewed by me and considered in my medical decision making (see chart for details).    MDM Rules/Calculators/A&P                          CC:TOA- failed OP treatment VS:  Vitals:   11/06/20 1647 11/06/20 1815 11/06/20 1845 11/06/20 1930  BP:  (!) 152/102 (!) 143/96 (!) 140/103  Pulse:  91 93 81    Resp:  16 18 18   Temp:      TempSrc:      SpO2:  97% 96% 98%  Weight: 101.6 kg     Height: 5\' 9"  (1.753 m)       RS:WNIOEVO is gathered by patient  and EMR. Previous records obtained  and reviewed. DDX: Labs: I ordered reviewed and interpreted labs which includes CBC shows a white blood cell count of 15,000, CMP with mild hypokalemia repleted orally Lipase within normal limits, urine appears to be without infection, Covid test is negative, negative pregnancy test. Imaging: Previous ultrasound showed right-sided hydrosalpinx.  Repeat outpatient CT scan showed an 8 cm tubo-ovarian abscess on the right today. EKG: Consults: Case discussed with Dr. Vivien Rota on-call for GYN.  She has accepted the patient.  Patient is currently being treated with cefoxitin and doxycycline. MDM: Patient with tubo-ovarian abscess.  I reviewed patient's labs she has no STIs.  Patient will be admitted to the GYN service at [facility].  I placed temporary holding orders. Patient disposition:The patient appears reasonably stabilized for admission considering the current resources, flow, and capabilities available in the ED at this time, and I doubt any other [facility] requiring further screening and/or treatment in the ED prior to admission.        Final Clinical Impression(s) / ED Diagnoses Final diagnoses:  None    Rx / DC Orders ED Discharge Orders    None       Margarita Mail, PA-C 11/06/20 2006    Isla Pence, MD 11/06/20 [id number]

## 2020-11-06 NOTE — ED Triage Notes (Signed)
Paitent has an abnormal abdominal CT. Patient was sent by her PCP to get IV antibiotics. Patient c/o increased abdominal pain.

## 2020-11-06 NOTE — Patient Instructions (Signed)
-  Nice seeing you today!!  -Toradol injection today for pain.  -Will send for repeat CT scan as well as GYN referral.

## 2020-11-06 NOTE — Progress Notes (Signed)
At 3 PM I have received a call from [facility] radiology regarding the results of her CT scan.  It shows interval progression of the inflammatory process in the right lower quadrant/adnexa now with an 8 cm abscess.  There is progressive wall thickening in the proximal sigmoid colon.  Considerations include PID with tubo-ovarian abscess and perforated colonic diverticulitis.  It is thought that perforated appendicitis is less likely.  I have contacted the patient and have advised that she go immediately to the ER for further evaluation.  She agrees.  Domingo Mend, MD [facility] at [facility]

## 2020-11-06 NOTE — ED Notes (Signed)
Report called to [facility] and [facility] called

## 2020-11-06 NOTE — H&P (Addendum)
OB/GYN History and Physical  Barbara Yates is a 49 y.o. G2P2 presenting for right lower quadrant pain. Reports new onset nausea/vomiting & diarrhea 10 days ago, thought it was a stomach bug but had no improvement and so went to urgent care, she was diagnosed with PID and sent home on antibiotics. Pain never really improved (stabbing pain in RLQ) and still having some diarrhea (non-bloody) and nausea/vomiting so went to see her PCP for follow up who ordered outpatient CT scan and called and told her to present to hospital when results were back.   Denies other symptoms, no h/o HTN, gastrointestinal issues.      Past Medical History:  Diagnosis Date  . Anxiety   . Chronic back pain   . Depression   . Hyperlipidemia   . Insomnia   . Migraine headache   . Myofascial pain     Past Surgical History:  Procedure Laterality Date  . TUBAL LIGATION      OB History  No obstetric history on file.    Social History   Socioeconomic History  . Marital status: Significant Other    Spouse name: Not on file  . Number of children: Not on file  . Years of education: Not on file  . Highest education level: Not on file  Occupational History  . Not on file  Tobacco Use  . Smoking status: Former Smoker    Packs/day: 0.25    Years: 3.00    Pack years: 0.75    Types: Cigarettes    Start date: 08/13/1995    Quit date: 09/27/1998    Years since quitting: 22.1  . Smokeless tobacco: Never Used  . Tobacco comment: quit in 1999  Vaping Use  . Vaping Use: Never used  Substance and Sexual Activity  . Alcohol use: Yes    Alcohol/week: 4.0 standard drinks    Types: 4 Shots of liquor per week  . Drug use: No  . Sexual activity: Yes    Birth control/protection: Surgical  Other Topics Concern  . Not on file  Social History Narrative   ** Merged History Encounter **       Social Determinants of Health   Financial Resource Strain:   . Difficulty of Paying Living Expenses: Not on file   Food Insecurity:   . Worried About Charity fundraiser in the Last Year: Not on file  . Ran Out of Food in the Last Year: Not on file  Transportation Needs:   . Lack of Transportation (Medical): Not on file  . Lack of Transportation (Non-Medical): Not on file  Physical Activity:   . Days of Exercise per Week: Not on file  . Minutes of Exercise per Session: Not on file  Stress:   . Feeling of Stress : Not on file  Social Connections:   . Frequency of Communication with Friends and Family: Not on file  . Frequency of Social Gatherings with Friends and Family: Not on file  . Attends Religious Services: Not on file  . Active Member of Clubs or Organizations: Not on file  . Attends Archivist Meetings: Not on file  . Marital Status: Not on file    Family History  Problem Relation Age of Onset  . Hypertension Paternal Grandmother   . CVA Paternal Grandmother   . Alzheimer's disease Paternal Grandmother   . Cancer Paternal Grandfather        Pancreatic  . Pancreatic cancer Maternal Grandfather     (  Not in a hospital admission)   No Known Allergies  Review of Systems: Negative except for what is mentioned in HPI.     Physical Exam: BP (!) 143/96   Pulse 93   Temp 98.2 F (36.8 C) (Oral)   Resp 18   Ht 5\' 9"  (1.753 m)   Wt 101.6 kg   LMP 11/06/2020   SpO2 96%   BMI 33.09 kg/m  CONSTITUTIONAL: Well-developed, well-nourished female in no acute distress.  HENT:  Normocephalic, atraumatic, External right and left ear normal. Oropharynx is clear and moist EYES: Conjunctivae and EOM are normal. Pupils are equal, round, and reactive to light. No scleral icterus.  NECK: Normal range of motion, supple, no masses SKIN: Skin is warm and dry. No rash noted. Not diaphoretic. No erythema. No pallor. [place]: Alert and oriented to person, place, and time. Normal reflexes, muscle tone coordination. No cranial nerve deficit noted. PSYCHIATRIC: Normal mood and affect.  Normal behavior. Normal judgment and thought content. CARDIOVASCULAR: Normal heart rate noted RESPIRATORY: Effort normal, no problems with respiration noted ABDOMEN: Soft, moderately tender in mid lower and right lower abdomen, no guarding PELVIC: Deferred MUSCULOSKELETAL: Normal range of motion. No edema and no tenderness. 2+ distal pulses.   Pertinent Labs/Studies:   Results for orders placed or performed during the hospital encounter of 11/06/20 (from the past 72 hour(s))  Lipase, blood     Status: None   Collection Time: 11/06/20  6:07 PM  Result Value Ref Range   Lipase 23 11 - 51 U/L    Comment: Performed at [facility], [place] [address], [place], [place] [address]  Comprehensive metabolic panel     Status: Abnormal   Collection Time: 11/06/20  6:07 PM  Result Value Ref Range   Sodium 139 135 - 145 mmol/L   Potassium 3.2 (L) 3.5 - 5.1 mmol/L   Chloride 101 98 - 111 mmol/L   CO2 27 22 - 32 mmol/L   Glucose, Bld 98 70 - 99 mg/dL    Comment: Glucose reference range applies only to samples taken after fasting for at least 8 hours.   BUN 7 6 - 20 mg/dL   Creatinine, Ser 0.90 0.44 - 1.00 mg/dL   Calcium 8.7 (L) 8.9 - 10.3 mg/dL   Total Protein 7.9 6.5 - 8.1 g/dL   Albumin 3.4 (L) 3.5 - 5.0 g/dL   AST 10 (L) 15 - 41 U/L   ALT 8 0 - 44 U/L   Alkaline Phosphatase 96 38 - 126 U/L   Total Bilirubin 0.3 0.3 - 1.2 mg/dL   GFR, Estimated >60 >60 mL/min    Comment: (NOTE) Calculated using the CKD-EPI Creatinine Equation (2021)    Anion gap 11 5 - 15    Comment: Performed at [facility], [place] [address], [place], [place] [address]  CBC     Status: Abnormal   Collection Time: 11/06/20  6:07 PM  Result Value Ref Range   WBC 15.2 (H) 4.0 - 10.5 K/uL   RBC 4.23 3.87 - 5.11 MIL/uL   Hemoglobin 12.6 12.0 - 15.0 g/dL   HCT 39.0 36 - 46 %   MCV 92.2 80.0 - 100.0 fL   MCH 29.8 26.0 - 34.0 pg   MCHC 32.3 30.0 - 36.0 g/dL   RDW 14.2 11.5 - 15.5 %    Platelets 434 (H) 150 - 400 K/uL   nRBC 0.0 0.0 - 0.2 %    Comment: Performed at [facility]  [place] [address], [place], [place] [address]  I-Stat beta hCG blood, ED     Status: None   Collection Time: 11/06/20  6:16 PM  Result Value Ref Range   I-stat hCG, quantitative <5.0 <5 mIU/mL   Comment 3            Comment:   GEST. AGE      CONC.  (mIU/mL)   <=1 WEEK        5 - 50     2 WEEKS       50 - 500     3 WEEKS       100 - 10,000     4 WEEKS     1,000 - 30,000        FEMALE AND NON-PREGNANT FEMALE:     LESS THAN 5 mIU/mL        Assessment and Plan :SIDRA OLDFIELD is a 49 y.o. G2P2 admitted for right lower quadrant pain originally treated outpatient for pelvic inflammatory disease, now with concern for tubo-ovarian abscess versus perforated colonic diverticulitis with 8 cm abscess in RLQ. Admitted and started on cefoxitin/doxycycline and general surgery consulted for same. Reviewed plan for NPO and antibiotics with patient, spoke with General Surgery who recommended adding antibiotic coverage for gut microbes and they will see her. Added Flagyl, patient NPO in case of IR drain in am.  Cefoxitin/doxycycline/flagyl NPO VS Q4 Home meds ordered   Feliz Beam, M.D. Attending [place], [facility] for [place]  Changed abx to zosyn/doxy per pharmacy.  Feliz Beam, M.D. Attending Center for [employer] Fish farm manager)

## 2020-11-07 ENCOUNTER — Inpatient Hospital Stay (HOSPITAL_COMMUNITY): Payer: BC Managed Care – PPO

## 2020-11-07 DIAGNOSIS — K651 Peritoneal abscess: Secondary | ICD-10-CM | POA: Diagnosis not present

## 2020-11-07 LAB — PROTIME-INR
INR: 1.1 (ref 0.8–1.2)
Prothrombin Time: 13.8 seconds (ref 11.4–15.2)

## 2020-11-07 MED ORDER — FENTANYL CITRATE (PF) 100 MCG/2ML IJ SOLN
INTRAMUSCULAR | Status: AC
  Filled 2020-11-07: qty 2

## 2020-11-07 MED ORDER — PIPERACILLIN-TAZOBACTAM 3.375 G IVPB
3.3750 g | Freq: Three times a day (TID) | INTRAVENOUS | Status: DC
  Administered 2020-11-07 – 2020-11-09 (×8): 3.375 g via INTRAVENOUS
  Filled 2020-11-07 (×8): qty 50

## 2020-11-07 MED ORDER — FENTANYL CITRATE (PF) 100 MCG/2ML IJ SOLN
INTRAMUSCULAR | Status: AC | PRN
Start: 2020-11-07 — End: 2020-11-07
  Administered 2020-11-07 (×2): 50 ug via INTRAVENOUS
  Administered 2020-11-07: 25 ug via INTRAVENOUS
  Administered 2020-11-07: 50 ug via INTRAVENOUS
  Administered 2020-11-07: 25 ug via INTRAVENOUS

## 2020-11-07 MED ORDER — MIDAZOLAM HCL 2 MG/2ML IJ SOLN
INTRAMUSCULAR | Status: AC
  Filled 2020-11-07: qty 2

## 2020-11-07 MED ORDER — SODIUM CHLORIDE 0.9% FLUSH
5.0000 mL | Freq: Three times a day (TID) | INTRAVENOUS | Status: DC
  Administered 2020-11-07 – 2020-11-09 (×7): 5 mL

## 2020-11-07 MED ORDER — METRONIDAZOLE IN NACL 5-0.79 MG/ML-% IV SOLN
500.0000 mg | Freq: Three times a day (TID) | INTRAVENOUS | Status: DC
Start: 2020-11-07 — End: 2020-11-07

## 2020-11-07 MED ORDER — ATORVASTATIN CALCIUM 10 MG PO TABS
10.0000 mg | ORAL_TABLET | Freq: Every day | ORAL | Status: DC
  Administered 2020-11-07 – 2020-11-09 (×3): 10 mg via ORAL
  Filled 2020-11-07 (×3): qty 1

## 2020-11-07 MED ORDER — CYCLOBENZAPRINE HCL 10 MG PO TABS
10.0000 mg | ORAL_TABLET | Freq: Every day | ORAL | Status: DC | PRN
  Administered 2020-11-08: 10 mg via ORAL
  Filled 2020-11-07: qty 1

## 2020-11-07 MED ORDER — MIDAZOLAM HCL 2 MG/2ML IJ SOLN
INTRAMUSCULAR | Status: AC | PRN
  Administered 2020-11-07: 1 mg via INTRAVENOUS
  Administered 2020-11-07 (×2): 0.5 mg via INTRAVENOUS
  Administered 2020-11-07: 1 mg via INTRAVENOUS
  Administered 2020-11-07 (×2): 0.5 mg via INTRAVENOUS

## 2020-11-07 MED ORDER — CITALOPRAM HYDROBROMIDE 40 MG PO TABS
40.0000 mg | ORAL_TABLET | Freq: Every day | ORAL | Status: DC
  Administered 2020-11-07 – 2020-11-09 (×3): 40 mg via ORAL
  Filled 2020-11-07 (×3): qty 1

## 2020-11-07 MED ORDER — LIDOCAINE HCL 1 % IJ SOLN
INTRAMUSCULAR | Status: AC
  Filled 2020-11-07: qty 20

## 2020-11-07 NOTE — Consult Note (Signed)
Chief Complaint: Patient was seen in consultation today for CT-guided drainage of right lower abdominal abscess Chief Complaint  Patient presents with  . abnormal CT  . Abdominal Pain    Referring Physician(s): Toth,P  Supervising Physician: Markus Daft  Patient Status: [facility] - In-pt  History of Present Illness: Barbara Yates is a 49 y.o. female with past medical history of anxiety/depression, chronic back pain, hyperlipidemia, and migraines who was admitted  to [facility] on 12/3 with persistent right lower quadrant abdominal /pelvic discomfort.  She was initially treated for PID as an outpatient but right lower quadrant pain did not improve.  Latest CT scan of abdomen pelvis performed yesterday revealed progression of inflammatory process in the right lower quadrant/adnexa now with a new 8 cm abscess.  Also present was progressive wall thickening of the proximal sigmoid colon with differentials including PID with tubo-ovarian abscess, perforated colonic diverticulitis versus perforated appendicitis. There was  increased size of a cystic focus in the left adnexa now 3.2 cm, and trace right effusion.  Patient is currently afebrile, WBC 15.2, hemoglobin 12.6, plts 434k, potassium 3.2, creatinine normal, COVID 19 neg. Request now received from surgical team for CT-guided drainage of right lower quadrant abscess.  Past Medical History:  Diagnosis Date  . Anxiety   . Chronic back pain   . Depression   . Hyperlipidemia   . Insomnia   . Migraine headache   . Myofascial pain     Past Surgical History:  Procedure Laterality Date  . CERVICAL SPINE SURGERY    . TUBAL LIGATION      Allergies: Patient has no known allergies.  Medications: Prior to Admission medications   Medication Sig Start Date End Date Taking? Authorizing Provider  acetaminophen (TYLENOL) 500 MG tablet Take 1,000 mg by mouth every 6 (six) hours as needed for moderate pain.   Yes [provider]  ALPRAZolam (XANAX) 1 MG tablet TAKE 1 TABLET EVERY DAY AS NEEDED FOR ANXIETY Patient taking differently: Take 1 mg by mouth daily as needed for anxiety.  09/16/20  Yes Isaac Bliss, Rayford Halsted, MD  atorvastatin (LIPITOR) 10 MG tablet Take 1 tablet (10 mg total) by mouth daily. 11/06/20  Yes Isaac Bliss, Rayford Halsted, MD  citalopram (CELEXA) 40 MG tablet Take 1 tablet (40 mg total) by mouth daily. 06/30/20  Yes Isaac Bliss, Rayford Halsted, MD  cyclobenzaprine (FLEXERIL) 10 MG tablet TAKE 1 TABLET BY MOUTH AT BEDTIME AS NEEDED FOR SPASMS Patient taking differently: Take 10 mg by mouth daily as needed for muscle spasms.  09/16/20  Yes Isaac Bliss, Rayford Halsted, MD  ondansetron (ZOFRAN ODT) 4 MG disintegrating tablet Take 1 tablet (4 mg total) by mouth every 8 (eight) hours as needed for nausea or vomiting. 10/28/20  Yes Walisiewicz, Kaitlyn E, PA-C  valACYclovir (VALTREX) 1000 MG tablet TAKE 1 TABLET BY MOUTH EVERY DAY Patient taking differently: Take 1,000 mg by mouth daily as needed (breakouts).  07/21/20  Yes Isaac Bliss, Rayford Halsted, MD  zolpidem (AMBIEN) 10 MG tablet Take 1 tablet (10 mg total) by mouth at bedtime as needed for sleep. 07/07/20  Yes Isaac Bliss, Rayford Halsted, MD  doxycycline (VIBRAMYCIN) 100 MG capsule Take 1 capsule (100 mg total) by mouth 2 (two) times daily for 14 days. 10/29/20 11/12/20  Barrie Folk, PA-C  HYDROcodone-acetaminophen (NORCO/VICODIN) 5-325 MG tablet Take 2 tablets by mouth every 6 (six) hours as needed for severe pain. Patient not taking: Reported on 11/06/2020 10/28/20  Barrie Folk, PA-C     Family History  Problem Relation Age of Onset  . Hypertension Paternal Grandmother   . CVA Paternal Grandmother   . Alzheimer's disease Paternal Grandmother   . Cancer Paternal Grandfather        Pancreatic  . Pancreatic cancer Maternal Grandfather     Social History   Socioeconomic History  . Marital status: Significant Other    Spouse  name: Not on file  . Number of children: Not on file  . Years of education: Not on file  . Highest education level: Not on file  Occupational History  . Not on file  Tobacco Use  . Smoking status: Former Smoker    Packs/day: 0.25    Years: 3.00    Pack years: 0.75    Types: Cigarettes    Start date: 08/13/1995    Quit date: 09/27/1998    Years since quitting: 22.1  . Smokeless tobacco: Never Used  . Tobacco comment: quit in 1999  Vaping Use  . Vaping Use: Never used  Substance and Sexual Activity  . Alcohol use: Yes    Alcohol/week: 4.0 standard drinks    Types: 4 Shots of liquor per week  . Drug use: No  . Sexual activity: Yes    Birth control/protection: Surgical  Other Topics Concern  . Not on file  Social History Narrative   ** Merged History Encounter **       Social Determinants of Health   Financial Resource Strain:   . Difficulty of Paying Living Expenses: Not on file  Food Insecurity:   . Worried About Charity fundraiser in the Last Year: Not on file  . Ran Out of Food in the Last Year: Not on file  Transportation Needs:   . Lack of Transportation (Medical): Not on file  . Lack of Transportation (Non-Medical): Not on file  Physical Activity:   . Days of Exercise per Week: Not on file  . Minutes of Exercise per Session: Not on file  Stress:   . Feeling of Stress : Not on file  Social Connections:   . Frequency of Communication with Friends and Family: Not on file  . Frequency of Social Gatherings with Friends and Family: Not on file  . Attends Religious Services: Not on file  . Active Member of Clubs or Organizations: Not on file  . Attends Archivist Meetings: Not on file  . Marital Status: Not on file      Review of Systems see above;  denies fever, headache, chest pain, dyspnea, cough, nausea, vomiting or bleeding  Vital Signs: BP (!) 152/99 (BP Location: Right Arm)   Pulse 89   Temp 98.3 F (36.8 C) (Oral)   Resp 18   Ht 5\' 9"   (1.753 m)   Wt 224 lb 1.1 oz (101.6 kg)   LMP 11/06/2020   SpO2 96%   BMI 33.09 kg/m   Physical Exam awake, alert.  Chest with slightly diminished breath sounds right base, left clear.  Heart with regular rate and rhythm.  Abdomen soft, few bowel sounds, mild to moderately tender right lower quadrant.  No lower extremity edema.  Imaging: CT Abdomen Pelvis W Contrast  Result Date: 11/06/2020 CLINICAL DATA:  Persistent right lower quadrant pain. History of PID and tubo-ovarian abscess. EXAM: CT ABDOMEN AND PELVIS WITH CONTRAST TECHNIQUE: Multidetector CT imaging of the abdomen and pelvis was performed using the standard protocol following bolus administration of intravenous contrast. CONTRAST:  182mL OMNIPAQUE IOHEXOL 300 MG/ML  SOLN COMPARISON:  10/28/2020 FINDINGS: Lower chest: New trace right pleural effusion and mild right lower lobe atelectasis. Hepatobiliary: Unchanged subcentimeter hypodensity in the right hepatic lobe, too small to fully characterize. Unremarkable gallbladder. Pancreas: Unremarkable. Spleen: Unremarkable. Adrenals/Urinary Tract: Unremarkable adrenal glands. No evidence of renal mass, calculi, or hydronephrosis. Unremarkable bladder. Stomach/Bowel: The stomach is unremarkable. There is no evidence of bowel obstruction. There is diverticulosis of the descending and sigmoid colon. Wall thickening of the proximal sigmoid colon has increased with increased surrounding inflammation. A complex inflammatory process in the right adnexa has progressed from the prior CT, and there is a new 8.2 x 4.6 cm gas and fluid collection extending laterally in the right lower quadrant anterior to the cecum. The terminal ileum is displaced posteriorly and compressed by the fluid collection. Regional inflammation involves a small portion of the distal ileum. The appendix is located posterior to the fluid collection, is opacified with oral contrast material, and measures 5-6 mm in diameter. The regional  inflammatory process involves the mid and distal aspects of the appendix. Vascular/Lymphatic: Mild abdominal aortic atherosclerosis without aneurysm. Mild interval enlargement of subcentimeter short axis lymph nodes in the right lower quadrant mesentery and retroperitoneum, favored to be reactive. Reproductive: Increased size of cystic focus in the left adnexa, now 3.2 x 3.2 cm. Anterior uterine fibroid as previously seen. Other: Small volume intraperitoneal free fluid. Musculoskeletal: No acute osseous abnormality or suspicious osseous lesion. Asymmetrically advanced disc degeneration on the right at L3-4 and on the left at L4-5. IMPRESSION: 1. Interval progression of inflammatory process in the right lower quadrant/adnexa, now with an 8 cm abscess. Progressive wall thickening of the proximal sigmoid colon. Considerations include PID with tubo-ovarian abscess and perforated colonic diverticulitis. Perforated appendicitis is considered less likely. 2. Increased size of a cystic focus in the left adnexa, now 3.2 cm. 3. New trace right pleural effusion. 4. Aortic Atherosclerosis (ICD10-I70.0). These results will be called to the ordering clinician or representative by the Radiologist Assistant, and communication documented in the PACS or [employer]. Electronically Signed   By: Logan Bores M.D.   On: 11/06/2020 14:36   CT ABDOMEN PELVIS W CONTRAST  Result Date: 10/28/2020 CLINICAL DATA:  Diffuse abdominal pain. EXAM: CT ABDOMEN AND PELVIS WITH CONTRAST TECHNIQUE: Multidetector CT imaging of the abdomen and pelvis was performed using the standard protocol following bolus administration of intravenous contrast. CONTRAST:  110mL OMNIPAQUE IOHEXOL 300 MG/ML  SOLN COMPARISON:  None. FINDINGS: Lower chest: No acute abnormality. Hepatobiliary: A 7 mm well-defined focus of parenchymal low attenuation is seen within the posterolateral aspect of the right lobe the liver. No gallstones, gallbladder wall thickening, or  biliary dilatation. Pancreas: Unremarkable. No pancreatic ductal dilatation or surrounding inflammatory changes. Spleen: Normal in size without focal abnormality. Adrenals/Urinary Tract: Adrenal glands are unremarkable. Kidneys are normal, without renal calculi, focal lesion, or hydronephrosis. Bladder is unremarkable. Stomach/Bowel: Stomach is within normal limits. No evidence of bowel dilatation. Numerous diverticula are seen throughout the large bowel. Mild thickening of a short segment of proximal sigmoid colon is also seen (axial CT images 69 through 74, CT series number 2). The appendix is not clearly identified. A 6 mm thick curvilinear area of soft tissue attenuation is seen within the posteromedial aspect of the right lower quadrant. This appears to extend from the posterior aspect of the cecum, toward the midline of the posterior pelvis (axial CT images 63 through 69, CT series number 2/sagittal reformatted images  99 through 121, CT series number 6). A 1.0 cm focus of air is seen within the anterior aspect of the right lower quadrant (axial CT image 60, CT series number 2). This is adjacent to the anteromedial aspect of the cecum. Vascular/Lymphatic: There is mild calcification of the abdominal aorta and bilateral common iliac arteries, without evidence of aneurysmal dilatation. No enlarged abdominal or pelvic lymph nodes. Reproductive: The uterine fundus is enlarged and heterogeneous in appearance. A 2.7 cm x 1.7 cm cyst is seen within the anterior aspect of the left adnexa. A mild amount of mesenteric inflammatory fat stranding is seen within the medial aspect of the right lower quadrant. This extends to the right adnexa. A 1.0 cm diameter fluid-filled tubular appearing area is seen within this region which may represent a dilated fallopian tube (axial CT image 69, CT series number 2). An adjacent 3.9 cm x 2.3 cm well-defined area of fluid attenuation is seen, posteriorly (axial CT images 64 through 73,  CT series number 2). Other: No abdominal wall hernia or abnormality. No abdominopelvic ascites. Musculoskeletal: No acute or significant osseous findings. IMPRESSION: 1. Inflammatory process within the right lower quadrant/right hemipelvis which is suspicious for pelvic inflammatory disease with an associated right-sided tubo-ovarian abscess. The presence of an inflamed, perforated appendix cannot completely be excluded. Correlation with pelvic ultrasound is recommended. 2. Colonic diverticulosis. 3. Mild thickening of a short segment of proximal sigmoid colon which may be secondary to poor bowel distention. Correlation with follow-up abdomen pelvis CT is recommended to exclude the presence of a soft tissue mass is recommended. 4. Enlarged, heterogeneous uterine fundus, which may represent a fibroid uterus. 5. Left adnexal cyst, likely ovarian in origin. 6. Small hepatic cyst versus hemangioma. 7. Aortic atherosclerosis. Aortic Atherosclerosis (ICD10-I70.0). Electronically Signed   By: Virgina Norfolk M.D.   On: 10/28/2020 19:35   US PELVIC COMPLETE W TRANSVAGINAL AND TORSION R/O  Result Date: 10/28/2020 CLINICAL DATA:  Pelvic pain EXAM: TRANSABDOMINAL AND TRANSVAGINAL ULTRASOUND OF PELVIS DOPPLER ULTRASOUND OF OVARIES TECHNIQUE: Both transabdominal and transvaginal ultrasound examinations of the pelvis were performed. Transabdominal technique was performed for global imaging of the pelvis including uterus, ovaries, adnexal regions, and pelvic cul-de-sac. It was necessary to proceed with endovaginal exam following the transabdominal exam to visualize the ovaries. Color and duplex Doppler ultrasound was utilized to evaluate blood flow to the ovaries. COMPARISON:  CT from the same day FINDINGS: Uterus Measurements: 8.9 x 6.5 x 8.3 cm = volume: 250 mL. There is a uterine fibroid measuring 5.9 x 4.6 x 6.4 cm. Endometrium Thickness: 5.6 mm.  No focal abnormality visualized. Right ovary Measurements: 7.8 x 4.7 x  5.5 cm = volume: 105 mL. There is a complex cystic mass measuring 3.8 x 3.5 x 3.9 cm. This mass demonstrates lace-like echogenic foci. In the region the right adnexa there is a tubular fluid-filled structure Left ovary Measurements: 3.6 x 2.4 x 3 cm = volume: 13 mL. Normal appearance/no adnexal mass. Pulsed Doppler evaluation of both ovaries demonstrates normal low-resistance arterial and venous waveforms. Other findings No abnormal free fluid. IMPRESSION: 1. Overall findings are most consistent with PID with a right-sided pyosalpinx. 2. No evidence for ovarian torsion. 3. Fibroid uterus. Electronically Signed   By: Constance Holster M.D.   On: 10/28/2020 20:56    Labs:  CBC: Recent Labs    01/02/20 1512 10/28/20 1700 11/06/20 1807  WBC 7.5 16.1* 15.2*  HGB 13.7 13.1 12.6  HCT 41.1 40.2 39.0  PLT 275.0  292 434*    COAGS: No results for input(s): INR, APTT in the last 8760 hours.  BMP: Recent Labs    01/02/20 1512 10/28/20 1700 11/06/20 1807  NA 138 138 139  K 4.4 3.1* 3.2*  CL 104 101 101  CO2 27 24 27   GLUCOSE 88 108* 98  BUN 8 9 7   CALCIUM 8.9 8.6* 8.7*  CREATININE 0.91 0.96 0.90  GFRNONAA  --  >60 >60    LIVER FUNCTION TESTS: Recent Labs    01/02/20 1512 10/28/20 1700 11/06/20 1807  BILITOT 0.4 0.6 0.3  AST 13 12* 10*  ALT 11 11 8   ALKPHOS 78 96 96  PROT 6.7 8.1 7.9  ALBUMIN 4.2 3.9 3.4*    TUMOR MARKERS: No results for input(s): AFPTM, CEA, CA199, CHROMGRNA in the last 8760 hours.  Assessment and Plan: 49 y.o. female with past medical history of anxiety/depression, chronic back pain, hyperlipidemia, and migraines who was admitted  to [facility] on 12/3 with persistent right lower quadrant abdominal /pelvic discomfort.  She was initially treated for PID as an outpatient but right lower quadrant pain did not improve.  Latest CT scan of abdomen pelvis performed yesterday revealed progression of inflammatory process in the right lower quadrant/adnexa  now with a new 8 cm abscess.  Also present was progressive wall thickening of the proximal sigmoid colon with differentials including PID with tubo-ovarian abscess, perforated colonic diverticulitis versus perforated appendicitis. There was  increased size of a cystic focus in the left adnexa now 3.2 cm, and trace right effusion.  Patient is currently afebrile, WBC 15.2, hemoglobin 12.6, plts 434k, potassium 3.2, creatinine normal, COVID 19 neg. Request now received from surgical team for CT-guided drainage of right lower quadrant abscess.  Imaging studies have been reviewed by Dr. Anselm Pancoast. Risks and benefits discussed with the patient including bleeding, infection, damage to adjacent structures, bowel perforation/fistula connection, and sepsis.  All of the patient's questions were answered, patient is agreeable to proceed. Consent signed and in chart.  Procedure scheduled for today   Thank you for this interesting consult.  I greatly enjoyed meeting Barbara Yates and look forward to participating in their care.  A copy of this report was sent to the requesting provider on this date.  Electronically Signed: D. Rowe Robert, PA-C 11/07/2020, 8:57 AM   I spent a total of 30 minutes    in face to face in clinical consultation, greater than 50% of which was counseling/coordinating care for CT-guided drainage of right lower quadrant abdominal abscess

## 2020-11-07 NOTE — Plan of Care (Signed)
  Problem: Clinical Measurements: Goal: Ability to maintain clinical measurements within normal limits will improve Outcome: Progressing   Problem: Pain Managment: Goal: General experience of comfort will improve Outcome: Progressing   

## 2020-11-07 NOTE — Progress Notes (Signed)
Pharmacy Antibiotic Note  Barbara Yates is a 49 y.o. female admitted on 11/06/2020 with abdominal pain, possible abscess/diverticulitis.  Pharmacy has been consulted for Zosyn dosing.  Plan: Zosyn 3.375 g IV q8h   Height: 5\' 9"  (175.3 cm) Weight: 101.6 kg (224 lb 1.1 oz) IBW/kg (Calculated) : 66.2  Temp (24hrs), Avg:98.3 F (36.8 C), Min:98.1 F (36.7 C), Max:98.5 F (36.9 C)  Recent Labs  Lab 11/06/20 1807  WBC 15.2*  CREATININE 0.90    Estimated Creatinine Clearance: 98.1 mL/min (by C-G formula based on SCr of 0.9 mg/dL).    No Known Allergies  Caryl Pina 11/07/2020 12:55 AM

## 2020-11-07 NOTE — Consult Note (Signed)
Reason for Consult:abd pain Referring Physician: Dr. Williemae Area is an 49 y.o. female.  HPI: The patient is a 49 year old female who presents with possible PID. She was initially treated for this as an outpt but when RLQ discomfort did not improve she had a CT which showed a RLQ abscess. It is difficult to tell what the source of the infection is but the differential would include PID, perforated appendicitis although the cecum does not appear to be very inflamed, perf sigmoid diverticulitits althought the diverticuli are on the left side of the abd.   Past Medical History:  Diagnosis Date  . Anxiety   . Chronic back pain   . Depression   . Hyperlipidemia   . Insomnia   . Migraine headache   . Myofascial pain     Past Surgical History:  Procedure Laterality Date  . CERVICAL SPINE SURGERY    . TUBAL LIGATION      Family History  Problem Relation Age of Onset  . Hypertension Paternal Grandmother   . CVA Paternal Grandmother   . Alzheimer's disease Paternal Grandmother   . Cancer Paternal Grandfather        Pancreatic  . Pancreatic cancer Maternal Grandfather     Social History:  reports that she quit smoking about 22 years ago. Her smoking use included cigarettes. She started smoking about 25 years ago. She has a 0.75 pack-year smoking history. She has never used smokeless tobacco. She reports current alcohol use of about 4.0 standard drinks of alcohol per week. She reports that she does not use drugs.  Allergies: No Known Allergies  Medications: I have reviewed the patient's current medications.  Results for orders placed or performed during the hospital encounter of 11/06/20 (from the past 48 hour(s))  Lipase, blood     Status: None   Collection Time: 11/06/20  6:07 PM  Result Value Ref Range   Lipase 23 11 - 51 U/L    Comment: Performed at [facility], [place] [address], [place], [place] [address]  Comprehensive metabolic panel     Status:  Abnormal   Collection Time: 11/06/20  6:07 PM  Result Value Ref Range   Sodium 139 135 - 145 mmol/L   Potassium 3.2 (L) 3.5 - 5.1 mmol/L   Chloride 101 98 - 111 mmol/L   CO2 27 22 - 32 mmol/L   Glucose, Bld 98 70 - 99 mg/dL    Comment: Glucose reference range applies only to samples taken after fasting for at least 8 hours.   BUN 7 6 - 20 mg/dL   Creatinine, Ser 0.90 0.44 - 1.00 mg/dL   Calcium 8.7 (L) 8.9 - 10.3 mg/dL   Total Protein 7.9 6.5 - 8.1 g/dL   Albumin 3.4 (L) 3.5 - 5.0 g/dL   AST 10 (L) 15 - 41 U/L   ALT 8 0 - 44 U/L   Alkaline Phosphatase 96 38 - 126 U/L   Total Bilirubin 0.3 0.3 - 1.2 mg/dL   GFR, Estimated >60 >60 mL/min    Comment: (NOTE) Calculated using the CKD-EPI Creatinine Equation (2021)    Anion gap 11 5 - 15    Comment: Performed at [facility], [place] [address] [place], [place] [address]  CBC     Status: Abnormal   Collection Time: 11/06/20  6:07 PM  Result Value Ref Range   WBC 15.2 (H) 4.0 - 10.5 K/uL   RBC 4.23 3.87 -  5.11 MIL/uL   Hemoglobin 12.6 12.0 - 15.0 g/dL   HCT 39.0 36 - 46 %   MCV 92.2 80.0 - 100.0 fL   MCH 29.8 26.0 - 34.0 pg   MCHC 32.3 30.0 - 36.0 g/dL   RDW 14.2 11.5 - 15.5 %   Platelets 434 (H) 150 - 400 K/uL   nRBC 0.0 0.0 - 0.2 %    Comment: Performed at [facility], [place] [address], [place], [place] [address]  Urinalysis, Routine w reflex microscopic Urine, Clean Catch     Status: Abnormal   Collection Time: 11/06/20  6:07 PM  Result Value Ref Range   Color, Urine YELLOW YELLOW   APPearance CLEAR CLEAR   Specific Gravity, Urine 1.010 1.005 - 1.030   pH 5.0 5.0 - 8.0   Glucose, UA NEGATIVE NEGATIVE mg/dL   Hgb urine dipstick NEGATIVE NEGATIVE   Bilirubin Urine NEGATIVE NEGATIVE   Ketones, ur NEGATIVE NEGATIVE mg/dL   Protein, ur 30 (A) NEGATIVE mg/dL   Nitrite NEGATIVE NEGATIVE   Leukocytes,Ua NEGATIVE NEGATIVE   RBC / HPF 0-5 0 - 5 RBC/hpf   WBC, UA 0-5 0 - 5 WBC/hpf   Bacteria,  UA NONE SEEN NONE SEEN   Squamous Epithelial / LPF 6-10 0 - 5   Ca Oxalate Crys, UA PRESENT     Comment: Performed at [facility], [place] [address] [place], [place] [address]  Resp Panel by RT-PCR (Flu A&B, Covid) Nasopharyngeal Swab     Status: None   Collection Time: 11/06/20  6:07 PM   Specimen: Nasopharyngeal Swab; Nasopharyngeal(NP) swabs in vial transport medium  Result Value Ref Range   SARS Coronavirus 2 by RT PCR NEGATIVE NEGATIVE    Comment: (NOTE) SARS-CoV-2 target nucleic acids are NOT DETECTED.  The SARS-CoV-2 RNA is generally detectable in upper respiratory specimens during the acute phase of infection. The lowest concentration of SARS-CoV-2 viral copies this assay can detect is 138 copies/mL. A negative result does not preclude SARS-Cov-2 infection and should not be used as the sole basis for treatment or other patient management decisions. A negative result may occur with  improper specimen collection/handling, submission of specimen other than nasopharyngeal swab, presence of viral mutation(s) within the areas targeted by this assay, and inadequate number of viral copies(<138 copies/mL). A negative result must be combined with clinical observations, patient history, and epidemiological information. The expected result is Negative.  Fact Sheet for Patients:  EntrepreneurPulse.com.au  Fact Sheet for Healthcare Providers:  IncredibleEmployment.be  This test is no t yet approved or cleared by the [place] FDA and  has been authorized for detection and/or diagnosis of SARS-CoV-2 by FDA under an Emergency Use Authorization (EUA). This EUA will remain  in effect (meaning this test can be used) for the duration of the COVID-19 declaration under Section 564(b)(1) of the Act, 21 U.S.C.section 360bbb-3(b)(1), unless the authorization is terminated  or revoked sooner.       Influenza A by PCR NEGATIVE NEGATIVE    Influenza B by PCR NEGATIVE NEGATIVE    Comment: (NOTE) The Xpert Xpress SARS-CoV-2/FLU/RSV plus assay is intended as an aid in the diagnosis of influenza from Nasopharyngeal swab specimens and should not be used as a sole basis for treatment. Nasal washings and aspirates are unacceptable for Xpert Xpress SARS-CoV-2/FLU/RSV testing.  Fact Sheet for Patients: EntrepreneurPulse.com.au  Fact Sheet for Healthcare Providers: IncredibleEmployment.be  This test is not yet approved or cleared by the [place] FDA  and has been authorized for detection and/or diagnosis of SARS-CoV-2 by FDA under an Emergency Use Authorization (EUA). This EUA will remain in effect (meaning this test can be used) for the duration of the COVID-19 declaration under Section 564(b)(1) of the Act, 21 U.S.C. section 360bbb-3(b)(1), unless the authorization is terminated or revoked.  Performed at [facility], [place] [address], [place], [place] [address]   I-Stat beta hCG blood, ED     Status: None   Collection Time: 11/06/20  6:16 PM  Result Value Ref Range   I-stat hCG, quantitative <5.0 <5 mIU/mL   Comment 3            Comment:   GEST. AGE      CONC.  (mIU/mL)   <=1 WEEK        5 - 50     2 WEEKS       50 - 500     3 WEEKS       100 - 10,000     4 WEEKS     1,000 - 30,000        FEMALE AND NON-PREGNANT FEMALE:     LESS THAN 5 mIU/mL     CT Abdomen Pelvis W Contrast  Result Date: 11/06/2020 CLINICAL DATA:  Persistent right lower quadrant pain. History of PID and tubo-ovarian abscess. EXAM: CT ABDOMEN AND PELVIS WITH CONTRAST TECHNIQUE: Multidetector CT imaging of the abdomen and pelvis was performed using the standard protocol following bolus administration of intravenous contrast. CONTRAST:  121mL OMNIPAQUE IOHEXOL 300 MG/ML  SOLN COMPARISON:  10/28/2020 FINDINGS: Lower chest: New trace right pleural effusion and mild right lower lobe atelectasis.  Hepatobiliary: Unchanged subcentimeter hypodensity in the right hepatic lobe, too small to fully characterize. Unremarkable gallbladder. Pancreas: Unremarkable. Spleen: Unremarkable. Adrenals/Urinary Tract: Unremarkable adrenal glands. No evidence of renal mass, calculi, or hydronephrosis. Unremarkable bladder. Stomach/Bowel: The stomach is unremarkable. There is no evidence of bowel obstruction. There is diverticulosis of the descending and sigmoid colon. Wall thickening of the proximal sigmoid colon has increased with increased surrounding inflammation. A complex inflammatory process in the right adnexa has progressed from the prior CT, and there is a new 8.2 x 4.6 cm gas and fluid collection extending laterally in the right lower quadrant anterior to the cecum. The terminal ileum is displaced posteriorly and compressed by the fluid collection. Regional inflammation involves a small portion of the distal ileum. The appendix is located posterior to the fluid collection, is opacified with oral contrast material, and measures 5-6 mm in diameter. The regional inflammatory process involves the mid and distal aspects of the appendix. Vascular/Lymphatic: Mild abdominal aortic atherosclerosis without aneurysm. Mild interval enlargement of subcentimeter short axis lymph nodes in the right lower quadrant mesentery and retroperitoneum, favored to be reactive. Reproductive: Increased size of cystic focus in the left adnexa, now 3.2 x 3.2 cm. Anterior uterine fibroid as previously seen. Other: Small volume intraperitoneal free fluid. Musculoskeletal: No acute osseous abnormality or suspicious osseous lesion. Asymmetrically advanced disc degeneration on the right at L3-4 and on the left at L4-5. IMPRESSION: 1. Interval progression of inflammatory process in the right lower quadrant/adnexa, now with an 8 cm abscess. Progressive wall thickening of the proximal sigmoid colon. Considerations include PID with tubo-ovarian abscess  and perforated colonic diverticulitis. Perforated appendicitis is considered less likely. 2. Increased size of a cystic focus in the left adnexa, now 3.2 cm. 3. New trace right pleural effusion. 4. Aortic Atherosclerosis (ICD10-I70.0). These results will  be called to the ordering clinician or representative by the Radiologist Assistant, and communication documented in the PACS or [employer]. Electronically Signed   By: Logan Bores M.D.   On: 11/06/2020 14:36    Review of Systems  Constitutional: Negative.   HENT: Negative.   Eyes: Negative.   Respiratory: Negative.   Cardiovascular: Negative.   Gastrointestinal: Positive for abdominal pain, diarrhea, nausea and vomiting.  Endocrine: Negative.   Genitourinary: Negative.   Musculoskeletal: Negative.   Skin: Negative.   Allergic/Immunologic: Negative.   Neurological: Negative.   Hematological: Negative.   Psychiatric/Behavioral: Negative.    Blood pressure (!) 152/99, pulse 89, temperature 98.3 F (36.8 C), temperature source Oral, resp. rate 18, height 5\' 9"  (1.753 m), weight 101.6 kg, last menstrual period 11/06/2020, SpO2 96 %. Physical Exam Constitutional:      General: She is not in acute distress.    Appearance: Normal appearance.  HENT:     Head: Normocephalic and atraumatic.     Right Ear: External ear normal.     Left Ear: External ear normal.     Nose: Nose normal.     Mouth/Throat:     Mouth: Mucous membranes are moist.     Pharynx: Oropharynx is clear.  Eyes:     General: No scleral icterus.    Extraocular Movements: Extraocular movements intact.     Conjunctiva/sclera: Conjunctivae normal.     Pupils: Pupils are equal, round, and reactive to light.  Cardiovascular:     Rate and Rhythm: Normal rate and regular rhythm.     Pulses: Normal pulses.     Heart sounds: Normal heart sounds.  Pulmonary:     Effort: Pulmonary effort is normal. No respiratory distress.     Breath sounds: Normal breath sounds.   Abdominal:     General: Abdomen is flat.     Palpations: Abdomen is soft.     Comments: There is mild RLQ tenderness  Musculoskeletal:        General: No tenderness or deformity. Normal range of motion.     Cervical back: Normal range of motion and neck supple. No tenderness.  Skin:    General: Skin is warm and dry.     Findings: No rash.  Neurological:     General: No focal deficit present.     Mental Status: She is alert and oriented to person, place, and time.     Gait: Gait normal.  Psychiatric:        Mood and Affect: Mood normal.     Assessment/Plan:  CT which showed a RLQ abscess. It is difficult to tell what the source of the infection is but the differential would include PID, perforated appendicitis although the cecum does not appear to be very inflamed, perf sigmoid diverticulitits althought the diverticuli are on the left side of the abd. At this point the treatment would include broad spectrum abx and possible perc drain if IR feels this is feasable. Will consult IR and follow closely with you   Autumn Messing III 11/07/2020, 8:05 AM

## 2020-11-07 NOTE — Progress Notes (Signed)
GYN Note IR called and Dr. Anselm Pancoast stated that based on imaging, particularly the amount of gas, and IR drainage that it looked more like sigmoid diverticulitis perf and not a TOA. Also, he saw contrast in the appendix and the appendix looked fine.  Gen Surg called and update given. They were okay d/c'ing the doxy if I was. Pt with recent gonorrhea, chlamydia and wet prep so no need for doxy, and they were okay keeping her on clears.   Durene Romans MD Attending [facility] [place] [place] [facility] GYN Consult Phone: [phone] (M-F, [phone]) & [phone] (Off hours, weekends, holidays)

## 2020-11-07 NOTE — Progress Notes (Signed)
    Gynecology Progress Note  Admission Date: 11/06/2020 Current Date: 11/07/2020 8:49 AM  Barbara Yates is a 49 y.o. [id number] HD#2 admitted for RLQ abscess   History complicated by: Patient Active Problem List   Diagnosis Date Noted  . TOA (tubo-ovarian abscess) 11/06/2020  . Pelvic abscess in female 11/06/2020  . Hyperlipidemia 01/03/2020  . Genital herpes 03/27/2019  . Depression 01/11/2017  . Insomnia 01/11/2017  . Morbid obesity due to excess calories ([place]) 01/07/2016  . Vitamin D deficiency 03/10/2015  . Myofascial pain 03/04/2015  . Chronic back pain 03/04/2015  . Anxiety 10/02/2013  . Fibromyalgia 05/27/2013    Subjective:  Patient feeling much the same. Still having RLQ pain, some nausea. Anxious about needing a procedure. Otherwise feeling well.  Objective:   Vitals:   11/06/20 2221 11/07/20 0047 11/07/20 0056 11/07/20 0602  BP: (!) 151/100 (!) 144/98 (!) 147/96 (!) 152/99  Pulse: 79 72 76 89  Resp: 18  17 18   Temp: 98.4 F (36.9 C) 98.5 F (36.9 C) 98.2 F (36.8 C) 98.3 F (36.8 C)  TempSrc: Oral Oral Oral Oral  SpO2: 97% 98% 97% 96%  Weight:      Height:        No intake/output data recorded.  Intake/Output Summary (Last 24 hours) at 11/07/2020 0849 Last data filed at 11/07/2020 3825 Gross per 24 hour  Intake 250 ml  Output 3 ml  Net 247 ml     Physical exam: BP (!) 152/99 (BP Location: Right Arm)   Pulse 89   Temp 98.3 F (36.8 C) (Oral)   Resp 18   Ht 5\' 9"  (1.753 m)   Wt 101.6 kg   LMP 11/06/2020   SpO2 96%   BMI 33.09 kg/m  CONSTITUTIONAL: Well-developed, well-nourished female in no acute distress.  HENT:  Normocephalic, atraumatic, External right and left ear normal. Oropharynx is clear and moist EYES: Conjunctivae and EOM are normal. Pupils are equal, round, and reactive to light. No scleral icterus.  NECK: Normal range of motion, supple, no masses.  Normal thyroid.  SKIN: Skin is warm and dry. No rash noted. Not diaphoretic.  No erythema. No pallor. NEUROLOGIC: Alert and oriented to person, place, and time. Normal reflexes, muscle tone coordination. No cranial nerve deficit noted. PSYCHIATRIC: Normal mood and affect. Normal behavior. Normal judgment and thought content. CARDIOVASCULAR: Normal heart rate noted RESPIRATORY: Effort normal, no problems with respiration noted. ABDOMEN: Soft, moderately tender in RLQ  PELVIC: deferred MUSCULOSKELETAL: Normal range of motion. No tenderness.  No cyanosis, clubbing, or edema.    UOP: voiding spontaneously  Labs  Recent Labs  Lab 11/06/20 1807  WBC 15.2*  HGB 12.6  HCT 39.0  PLT 434*     Assessment & Plan:   Patient is 49 y.o. [id number] HD#2 admitted for 8 cm abscess in RLQ. Reviewed with patient that it is of unclear origin, thus we are treating for TOA versus diverticular abscess and conservative management with antibiotics and drainage would be preferred if possible. Appreciate General Surgery recommendations.  Remain NPO Cont zosyn/doxy   To reach the attending on call, please call the [facility] [place] [facility] [place] Monday - Friday, 8 am - 5 pm: [phone] All other times: [phone]   K. Arvilla Meres, M.D. Attending [facility] for [employer] [facility])

## 2020-11-07 NOTE — Procedures (Addendum)
Interventional Radiology Procedure:   Indications: Right abdominal abscess, uncertain etiology but favor bowel origin.  Procedure:   CT guided abscess drain placement  Findings: 10 Fr drain placed in RLQ abscess.  Removed 40 ml of grayish purulent fluid.  Based on today's CT images, favor diverticulitis as the etiology for the abscess.  Extraluminal gas around sigmoid and there is contrast in appendix.    Complications: None     EBL: less than 5 ml  Plan: Follow output.  Send fluid for culture.  Will need follow up CT in 1-2 weeks when output diminishes.     Dalyce Renne R. Anselm Pancoast, MD  Pager: [phone]

## 2020-11-08 DIAGNOSIS — N7093 Salpingitis and oophoritis, unspecified: Secondary | ICD-10-CM

## 2020-11-08 LAB — CBC WITH DIFFERENTIAL/PLATELET
Abs Immature Granulocytes: 0.09 10*3/uL — ABNORMAL HIGH (ref 0.00–0.07)
Basophils Absolute: 0 10*3/uL (ref 0.0–0.1)
Basophils Relative: 0 %
Eosinophils Absolute: 0.1 10*3/uL (ref 0.0–0.5)
Eosinophils Relative: 1 %
HCT: 34.6 % — ABNORMAL LOW (ref 36.0–46.0)
Hemoglobin: 11 g/dL — ABNORMAL LOW (ref 12.0–15.0)
Immature Granulocytes: 1 %
Lymphocytes Relative: 18 %
Lymphs Abs: 1.9 10*3/uL (ref 0.7–4.0)
MCH: 29.3 pg (ref 26.0–34.0)
MCHC: 31.8 g/dL (ref 30.0–36.0)
MCV: 92 fL (ref 80.0–100.0)
Monocytes Absolute: 0.6 10*3/uL (ref 0.1–1.0)
Monocytes Relative: 6 %
Neutro Abs: 7.8 10*3/uL — ABNORMAL HIGH (ref 1.7–7.7)
Neutrophils Relative %: 74 %
Platelets: 364 10*3/uL (ref 150–400)
RBC: 3.76 MIL/uL — ABNORMAL LOW (ref 3.87–5.11)
RDW: 14 % (ref 11.5–15.5)
WBC: 10.5 10*3/uL (ref 4.0–10.5)
nRBC: 0 % (ref 0.0–0.2)

## 2020-11-08 LAB — BASIC METABOLIC PANEL
Anion gap: 12 (ref 5–15)
BUN: 6 mg/dL (ref 6–20)
CO2: 29 mmol/L (ref 22–32)
Calcium: 8.2 mg/dL — ABNORMAL LOW (ref 8.9–10.3)
Chloride: 96 mmol/L — ABNORMAL LOW (ref 98–111)
Creatinine, Ser: 1.02 mg/dL — ABNORMAL HIGH (ref 0.44–1.00)
GFR, Estimated: >60 mL/min (ref >60)
Glucose, Bld: 102 mg/dL — ABNORMAL HIGH (ref 70–99)
Potassium: 3.1 mmol/L — ABNORMAL LOW (ref 3.5–5.1)
Sodium: 137 mmol/L (ref 135–145)

## 2020-11-08 MED ORDER — LACTATED RINGERS IV SOLN: INTRAVENOUS | Status: DC

## 2020-11-08 MED ORDER — POTASSIUM CHLORIDE CRYS ER 20 MEQ PO TBCR
40.0000 meq | EXTENDED_RELEASE_TABLET | Freq: Two times a day (BID) | ORAL | Status: DC
  Administered 2020-11-08 – 2020-11-09 (×3): 40 meq via ORAL
  Filled 2020-11-08 (×3): qty 2

## 2020-11-08 NOTE — Progress Notes (Signed)
Gynecology Progress Note  Admission Date: 11/06/2020 Current Date: 11/08/2020 7:05 AM  Barbara Yates is a 49 y.o. [id number] HD#3/POD#1 IR drain placementadmitted for lower abdominal pain, concern for right sided TOA vs diverticulitis  History complicated by: Patient Active Problem List   Diagnosis Date Noted  . TOA (tubo-ovarian abscess) 11/06/2020  . Pelvic abscess in female 11/06/2020  . Hyperlipidemia 01/03/2020  . Genital herpes 03/27/2019  . Depression 01/11/2017  . Insomnia 01/11/2017  . Morbid obesity due to excess calories ([place]) 01/07/2016  . Vitamin D deficiency 03/10/2015  . Myofascial pain 03/04/2015  . Chronic back pain 03/04/2015  . Anxiety 10/02/2013  . Fibromyalgia 05/27/2013    ROS and patient/family/surgical history, located on admission H&P note dated 11/06/2020, have been reviewed, and there are no changes except as noted below Yesterday/Overnight Events:  Drain placed yesterday  Subjective:  No nausea, vomiting, fevers, chills. Lower belly discomfort stable. Taking PO fine but not much of an appetite to drink   Objective:    Current Vital Signs 24h Vital Sign Ranges  T 97.7 F (36.5 C) Temp  Avg: 97.9 F (36.6 C)  Min: 97.6 F (36.4 C)  Max: 98.5 F (36.9 C)  BP 130/85 BP  Min: 127/77  Max: 150/96  HR 62 Pulse  Avg: 72.3  Min: 62  Max: 77  RR 18 Resp  Avg: 18.1  Min: 15  Max: 22  SaO2 95 % Room Air SpO2  Avg: 98.8 %  Min: 95 %  Max: 100 %       24 Hour I/O Current Shift I/O  Time Ins Outs 12/04 0701 - 12/05 0700 In: 588.7 [P.O.:470; I.V.:8] Out: 115 [Drains:115] No intake/output data recorded.    Patient Vitals for the past 24 hrs:  BP Temp Temp src Pulse Resp SpO2  11/08/20 0433 130/85 97.7 F (36.5 C) Oral 62 18 95 %  11/07/20 2059 (!) 146/101 98.5 F (36.9 C) Oral 74 18 97 %  11/07/20 1142 130/84 97.6 F (36.4 C) Temporal 68 16 100 %  11/07/20 1115 127/77 -- -- 72 16 98 %  11/07/20 1055 129/83 -- -- 76 15 100 %  11/07/20 1050 (!)  149/92 -- -- 71 (!) 22 100 %  11/07/20 1045 -- -- -- 76 -- --  11/07/20 1040 (!) 150/96 -- -- 75 19 100 %  11/07/20 1016 (!) 148/92 -- -- 77 (!) 21 100 %   Drain output last 24: 56mL  Physical exam: General appearance: alert, cooperative and appears stated age Abdomen: rare BS, soft, minimally ttp, nd. JP drain in place with grey to sero sang fluid in the bulb and tube (minimal amount) Lungs: clear to auscultation bilaterally Heart: S1, S2 normal, no murmur, rub or gallop, regular rate and rhythm Extremities: no c/c/e Skin: warm and dry Psych: appropriate Neurologic: Grossly normal  Medications Current Facility-Administered Medications  Medication Dose Route Frequency Provider Last Rate Last Admin  . 0.9 %  sodium chloride infusion  250 mL Intravenous PRN Sloan Leiter, MD      . atorvastatin (LIPITOR) tablet 10 mg  10 mg Oral Daily Sloan Leiter, MD   10 mg at 11/07/20 0919  . bisacodyl (DULCOLAX) EC tablet 5 mg  5 mg Oral Daily PRN Sloan Leiter, MD      . citalopram (CELEXA) tablet 40 mg  40 mg Oral Daily Sloan Leiter, MD   40 mg at 11/07/20 0919  . cyclobenzaprine (FLEXERIL) tablet 10 mg  10 mg Oral Daily PRN Sloan Leiter, MD      . ibuprofen (ADVIL) tablet 600 mg  600 mg Oral Q6H PRN Sloan Leiter, MD      . ondansetron [facility]) tablet 4 mg  4 mg Oral Q6H PRN Sloan Leiter, MD       Or  . ondansetron [facility]) injection 4 mg  4 mg Intravenous Q6H PRN Sloan Leiter, MD   4 mg at 11/07/20 2134  . oxyCODONE-acetaminophen (PERCOCET/ROXICET) 5-325 MG per tablet 1-2 tablet  1-2 tablet Oral Q3H PRN Sloan Leiter, MD   2 tablet at 11/08/20 0216  . piperacillin-tazobactam (ZOSYN) IVPB 3.375 g  3.375 g Intravenous Q8H Sloan Leiter, MD 12.5 mL/hr at 11/08/20 0216 3.375 g at 11/08/20 0216  . polyethylene glycol (MIRALAX / GLYCOLAX) packet 17 g  17 g Oral Daily PRN Sloan Leiter, MD      . potassium chloride SA (KLOR-CON) CR tablet 40 mEq  40 mEq Oral BID Aletha Halim, MD       . prenatal multivitamin tablet 1 tablet  1 tablet Oral Q1200 Sloan Leiter, MD   1 tablet at 11/07/20 1225  . simethicone (MYLICON) chewable tablet 80 mg  80 mg Oral QID PRN Sloan Leiter, MD      . sodium chloride flush (NS) 0.9 % injection 3 mL  3 mL Intravenous Q12H Sloan Leiter, MD   3 mL at 11/07/20 2130  . sodium chloride flush (NS) 0.9 % injection 3 mL  3 mL Intravenous PRN Sloan Leiter, MD      . sodium chloride flush (NS) 0.9 % injection 5 mL  5 mL Intracatheter Q8H Markus Daft, MD   5 mL at 11/08/20 0516      Labs  Drain culture pending. Abundant wbc, moderate GPC in pairs in clusters, few GNRs Recent Labs  Lab 11/06/20 1807 11/08/20 0256  WBC 15.2* 10.5  HGB 12.6 11.0*  HCT 39.0 34.6*  PLT 434* 364    Recent Labs  Lab 11/06/20 1807 11/08/20 0256  NA 139 137  K 3.2* 3.1*  CL 101 96*  CO2 27 29  BUN 7 6  CREATININE 0.90 1.02*  CALCIUM 8.7* 8.2*  PROT 7.9  --   BILITOT 0.3  --   ALKPHOS 96  --   ALT 8  --   AST 10*  --   GLUCOSE 98 102*    Radiology Narrative & Impression  INDICATION: 49 year old with right lower quadrant abdominal abscess of uncertain etiology.  EXAM: CT-GUIDED PLACEMENT OF ABDOMINAL ABSCESS DRAIN  MEDICATIONS: Moderate sedation  ANESTHESIA/SEDATION: Fentanyl 200 mcg IV; Versed 4.0 mg IV  Moderate Sedation Time:  23 minutes  The patient was continuously monitored during the procedure by the interventional radiology nurse under my direct supervision.  COMPLICATIONS: None immediate.  PROCEDURE: Informed written consent was obtained from the patient after a thorough discussion of the procedural risks, benefits and alternatives. All questions were addressed. A timeout was performed prior to the initiation of the procedure.  Patient was placed supine on the CT scanner. Images through the abdomen were obtained. The large air-fluid collection in the right abdomen was identified. The overlying skin was prepped  with chlorhexidine and sterile field was created. Maximal barrier sterile technique was utilized including caps, mask, sterile gowns, sterile gloves, sterile drape, hand hygiene and skin antiseptic. Skin was anesthetized using 1% lidocaine. Small incision was made. Using CT guidance, an 21  gauge trocar needle was directed into the air-fluid collection and a combination of purulent fluid and air was aspirated. Superstiff Amplatz wire was advanced into the abscess. Tract was dilated to accommodate a 10.2 [place] multipurpose drain. Approximately 40 mL of purulent fluid was removed. Sample sent for culture. Catheter was attached to suction bulb. Catheter was sutured to skin and a bandage was placed.  FINDINGS: Large air-fluid collection in the right lower abdomen. Multiple foci of gas along the right side of the sigmoid colon. There is contrast within the appendix posterior to the abscess. Based on these findings, suspect that the etiology for the abscess is sigmoid diverticulitis. In addition, there may be evidence for fistula on sequence 2 image 21.  IMPRESSION: 1. Successful placement of a CT-guided drain within the right abdominal abscess. 40 mL of purulent fluid was removed. Sample of fluid was sent for culture. 2. Small foci of extraluminal gas around the sigmoid colon with multiple colonic diverticula. Etiology of the right lower quadrant abscesses most likely related to sigmoid diverticulitis.   Electronically Signed   By: Markus Daft M.D.   On: 11/07/2020 12:15     Assessment & Plan:  Pt doing well *GYN: will d/w Gen Surg about transferring to their service given radiology findings *ID: f/u drain cultures. Continue zosyn. S/p cefoxitin and doxy.  *Renal: likely dehydration. MIVF @ 100/hr added. Recheck labs in morning *Pain: PO PRNs *FEN/GI: clears, MIVF, kdur *PPx: SCDs, OOB ad lib. Consider starting lovenox today if not for anything else surgical *Dispo: per gen  surg, IR  Code Status: Full Code  Total time taking care of the patient was 15 minutes, with greater than 50% of the time spent in face to face interaction with the patient.  Durene Romans MD [facility] GYN Consult Phone: [phone] (M-F, 0800-1700) & [phone] (Off hours, weekends, holidays)

## 2020-11-08 NOTE — Progress Notes (Signed)
Subjective/Chief Complaint: Feels a little better. Drain in place   Objective: Vital signs in last 24 hours: Temp:  [97.6 F (36.4 C)-98.5 F (36.9 C)] 97.7 F (36.5 C) (12/05 0433) Pulse Rate:  [62-77] 62 (12/05 0433) Resp:  [15-22] 18 (12/05 0433) BP: (127-150)/(77-101) 130/85 (12/05 0433) SpO2:  [95 %-100 %] 95 % (12/05 0433) Last BM Date: 11/06/20  Intake/Output from previous day: 12/04 0701 - 12/05 0700 In: 588.7 [P.O.:470; I.V.:8; IV Piggyback:110.7] Out: 135 [Drains:135] Intake/Output this shift: No intake/output data recorded.  General appearance: alert and cooperative Resp: clear to auscultation bilaterally Cardio: regular rate and rhythm GI: soft, mild tenderness. drain output serosanguinous  Lab Results:  Recent Labs    11/06/20 1807 11/08/20 0256  WBC 15.2* 10.5  HGB 12.6 11.0*  HCT 39.0 34.6*  PLT 434* 364   BMET Recent Labs    11/06/20 1807 11/08/20 0256  NA 139 137  K 3.2* 3.1*  CL 101 96*  CO2 27 29  GLUCOSE 98 102*  BUN 7 6  CREATININE 0.90 1.02*  CALCIUM 8.7* 8.2*   PT/INR Recent Labs    11/07/20 0909  LABPROT 13.8  INR 1.1   ABG No results for input(s): PHART, HCO3 in the last 72 hours.  Invalid input(s): PCO2, PO2  Studies/Results: CT Abdomen Pelvis W Contrast  Result Date: 11/06/2020 CLINICAL DATA:  Persistent right lower quadrant pain. History of PID and tubo-ovarian abscess. EXAM: CT ABDOMEN AND PELVIS WITH CONTRAST TECHNIQUE: Multidetector CT imaging of the abdomen and pelvis was performed using the standard protocol following bolus administration of intravenous contrast. CONTRAST:  174mL OMNIPAQUE IOHEXOL 300 MG/ML  SOLN COMPARISON:  10/28/2020 FINDINGS: Lower chest: New trace right pleural effusion and mild right lower lobe atelectasis. Hepatobiliary: Unchanged subcentimeter hypodensity in the right hepatic lobe, too small to fully characterize. Unremarkable gallbladder. Pancreas: Unremarkable. Spleen: Unremarkable.  Adrenals/Urinary Tract: Unremarkable adrenal glands. No evidence of renal mass, calculi, or hydronephrosis. Unremarkable bladder. Stomach/Bowel: The stomach is unremarkable. There is no evidence of bowel obstruction. There is diverticulosis of the descending and sigmoid colon. Wall thickening of the proximal sigmoid colon has increased with increased surrounding inflammation. A complex inflammatory process in the right adnexa has progressed from the prior CT, and there is a new 8.2 x 4.6 cm gas and fluid collection extending laterally in the right lower quadrant anterior to the cecum. The terminal ileum is displaced posteriorly and compressed by the fluid collection. Regional inflammation involves a small portion of the distal ileum. The appendix is located posterior to the fluid collection, is opacified with oral contrast material, and measures 5-6 mm in diameter. The regional inflammatory process involves the mid and distal aspects of the appendix. Vascular/Lymphatic: Mild abdominal aortic atherosclerosis without aneurysm. Mild interval enlargement of subcentimeter short axis lymph nodes in the right lower quadrant mesentery and retroperitoneum, favored to be reactive. Reproductive: Increased size of cystic focus in the left adnexa, now 3.2 x 3.2 cm. Anterior uterine fibroid as previously seen. Other: Small volume intraperitoneal free fluid. Musculoskeletal: No acute osseous abnormality or suspicious osseous lesion. Asymmetrically advanced disc degeneration on the right at L3-4 and on the left at L4-5. IMPRESSION: 1. Interval progression of inflammatory process in the right lower quadrant/adnexa, now with an 8 cm abscess. Progressive wall thickening of the proximal sigmoid colon. Considerations include PID with tubo-ovarian abscess and perforated colonic diverticulitis. Perforated appendicitis is considered less likely. 2. Increased size of a cystic focus in the left adnexa, now 3.2 cm.  3. New trace right pleural  effusion. 4. Aortic Atherosclerosis (ICD10-I70.0). These results will be called to the ordering clinician or representative by the Radiologist Assistant, and communication documented in the PACS or [employer]. Electronically Signed   By: Logan Bores M.D.   On: 11/06/2020 14:36   CT IMAGE GUIDED DRAINAGE BY PERCUTANEOUS CATHETER  Result Date: 11/07/2020 INDICATION: 49 year old with right lower quadrant abdominal abscess of uncertain etiology. EXAM: CT-GUIDED PLACEMENT OF ABDOMINAL ABSCESS DRAIN MEDICATIONS: Moderate sedation ANESTHESIA/SEDATION: Fentanyl 200 mcg IV; Versed 4.0 mg IV Moderate Sedation Time:  23 minutes The patient was continuously monitored during the procedure by the interventional radiology nurse under my direct supervision. COMPLICATIONS: None immediate. PROCEDURE: Informed written consent was obtained from the patient after a thorough discussion of the procedural risks, benefits and alternatives. All questions were addressed. A timeout was performed prior to the initiation of the procedure. Patient was placed supine on the CT scanner. Images through the abdomen were obtained. The large air-fluid collection in the right abdomen was identified. The overlying skin was prepped with chlorhexidine and sterile field was created. Maximal barrier sterile technique was utilized including caps, mask, sterile gowns, sterile gloves, sterile drape, hand hygiene and skin antiseptic. Skin was anesthetized using 1% lidocaine. Small incision was made. Using CT guidance, an 18 gauge trocar needle was directed into the air-fluid collection and a combination of purulent fluid and air was aspirated. Superstiff Amplatz wire was advanced into the abscess. Tract was dilated to accommodate a 10.2 [place] multipurpose drain. Approximately 40 mL of purulent fluid was removed. Sample sent for culture. Catheter was attached to suction bulb. Catheter was sutured to skin and a bandage was placed. FINDINGS: Large  air-fluid collection in the right lower abdomen. Multiple foci of gas along the right side of the sigmoid colon. There is contrast within the appendix posterior to the abscess. Based on these findings, suspect that the etiology for the abscess is sigmoid diverticulitis. In addition, there may be evidence for fistula on sequence 2 image 21. IMPRESSION: 1. Successful placement of a CT-guided drain within the right abdominal abscess. 40 mL of purulent fluid was removed. Sample of fluid was sent for culture. 2. Small foci of extraluminal gas around the sigmoid colon with multiple colonic diverticula. Etiology of the right lower quadrant abscesses most likely related to sigmoid diverticulitis. Electronically Signed   By: Markus Daft M.D.   On: 11/07/2020 12:15    Anti-infectives: Anti-infectives (From admission, onward)   Start     Dose/Rate Route Frequency Ordered Stop   11/07/20 0200  piperacillin-tazobactam (ZOSYN) IVPB 3.375 g        3.375 g 12.5 mL/hr over 240 Minutes Intravenous Every 8 hours 11/07/20 0045     11/07/20 0100  metroNIDAZOLE (FLAGYL) IVPB 500 mg  Status:  Discontinued        500 mg 100 mL/hr over 60 Minutes Intravenous Every 8 hours 11/07/20 0005 11/07/20 0040   11/06/20 2200  doxycycline (VIBRA-TABS) tablet 100 mg  Status:  Discontinued        100 mg Oral Every 12 hours 11/06/20 1737 11/07/20 1244   11/06/20 1800  cefOXitin (MEFOXIN) 2 g in sodium chloride 0.9 % 100 mL IVPB  Status:  Discontinued        2 g 200 mL/hr over 30 Minutes Intravenous Every 6 hours 11/06/20 1737 11/07/20 0040      Assessment/Plan: s/p * No surgery found * Advance diet  Continue another day of IV abx. If  she continues to do this well then would switch to oral abx tomorrow and continue to advance diet. Will need follow up CT with IR in a week or so  LOS: 2 days    Autumn Messing III 11/08/2020

## 2020-11-08 NOTE — Progress Notes (Signed)
Referring Physician(s): Toth,P  Supervising Physician: Markus Daft  Patient Status:  [facility] - In-pt  Chief Complaint:  Right lower abdominal pain/abscess  Subjective: Pt feeling a little better since abd drain placed yesterday but still with RLQ discomfort; had some nausea earlier but resolved; no BM today   Allergies: Patient has no known allergies.  Medications: Prior to Admission medications   Medication Sig Start Date End Date Taking? Authorizing Provider  acetaminophen (TYLENOL) 500 MG tablet Take 1,000 mg by mouth every 6 (six) hours as needed for moderate pain.   Yes [provider]  ALPRAZolam (XANAX) 1 MG tablet TAKE 1 TABLET EVERY DAY AS NEEDED FOR ANXIETY Patient taking differently: Take 1 mg by mouth daily as needed for anxiety.  09/16/20  Yes Isaac Bliss, Rayford Halsted, MD  atorvastatin (LIPITOR) 10 MG tablet Take 1 tablet (10 mg total) by mouth daily. 11/06/20  Yes Isaac Bliss, Rayford Halsted, MD  citalopram (CELEXA) 40 MG tablet Take 1 tablet (40 mg total) by mouth daily. 06/30/20  Yes Isaac Bliss, Rayford Halsted, MD  cyclobenzaprine (FLEXERIL) 10 MG tablet TAKE 1 TABLET BY MOUTH AT BEDTIME AS NEEDED FOR SPASMS Patient taking differently: Take 10 mg by mouth daily as needed for muscle spasms.  09/16/20  Yes Isaac Bliss, Rayford Halsted, MD  ondansetron (ZOFRAN ODT) 4 MG disintegrating tablet Take 1 tablet (4 mg total) by mouth every 8 (eight) hours as needed for nausea or vomiting. 10/28/20  Yes Walisiewicz, Kaitlyn E, PA-C  valACYclovir (VALTREX) 1000 MG tablet TAKE 1 TABLET BY MOUTH EVERY DAY Patient taking differently: Take 1,000 mg by mouth daily as needed (breakouts).  07/21/20  Yes Isaac Bliss, Rayford Halsted, MD  zolpidem (AMBIEN) 10 MG tablet Take 1 tablet (10 mg total) by mouth at bedtime as needed for sleep. 07/07/20  Yes Isaac Bliss, Rayford Halsted, MD  doxycycline (VIBRAMYCIN) 100 MG capsule Take 1 capsule (100 mg total) by mouth 2 (two) times daily for 14  days. 10/29/20 11/12/20  Barrie Folk, PA-C  HYDROcodone-acetaminophen (NORCO/VICODIN) 5-325 MG tablet Take 2 tablets by mouth every 6 (six) hours as needed for severe pain. Patient not taking: Reported on 11/06/2020 10/28/20   Barrie Folk, PA-C     Vital Signs: BP (!) 134/93 (BP Location: Left Arm)   Pulse 62   Temp 98.4 F (36.9 C) (Oral)   Resp 20   Ht 5\' 9"  (1.753 m)   Wt 224 lb 1.1 oz (101.6 kg)   LMP 11/06/2020   SpO2 97%   BMI 33.09 kg/m   Physical Exam awake/alert; RLQ drain intact, insertion site ok, mildly tender, OP 135 cc turbid blood tinged fluid  Imaging: CT Abdomen Pelvis W Contrast  Result Date: 11/06/2020 CLINICAL DATA:  Persistent right lower quadrant pain. History of PID and tubo-ovarian abscess. EXAM: CT ABDOMEN AND PELVIS WITH CONTRAST TECHNIQUE: Multidetector CT imaging of the abdomen and pelvis was performed using the standard protocol following bolus administration of intravenous contrast. CONTRAST:  163mL OMNIPAQUE IOHEXOL 300 MG/ML  SOLN COMPARISON:  10/28/2020 FINDINGS: Lower chest: New trace right pleural effusion and mild right lower lobe atelectasis. Hepatobiliary: Unchanged subcentimeter hypodensity in the right hepatic lobe, too small to fully characterize. Unremarkable gallbladder. Pancreas: Unremarkable. Spleen: Unremarkable. Adrenals/Urinary Tract: Unremarkable adrenal glands. No evidence of renal mass, calculi, or hydronephrosis. Unremarkable bladder. Stomach/Bowel: The stomach is unremarkable. There is no evidence of bowel obstruction. There is diverticulosis of the descending and sigmoid colon. Wall thickening of the proximal  sigmoid colon has increased with increased surrounding inflammation. A complex inflammatory process in the right adnexa has progressed from the prior CT, and there is a new 8.2 x 4.6 cm gas and fluid collection extending laterally in the right lower quadrant anterior to the cecum. The terminal ileum is displaced  posteriorly and compressed by the fluid collection. Regional inflammation involves a small portion of the distal ileum. The appendix is located posterior to the fluid collection, is opacified with oral contrast material, and measures 5-6 mm in diameter. The regional inflammatory process involves the mid and distal aspects of the appendix. Vascular/Lymphatic: Mild abdominal aortic atherosclerosis without aneurysm. Mild interval enlargement of subcentimeter short axis lymph nodes in the right lower quadrant mesentery and retroperitoneum, favored to be reactive. Reproductive: Increased size of cystic focus in the left adnexa, now 3.2 x 3.2 cm. Anterior uterine fibroid as previously seen. Other: Small volume intraperitoneal free fluid. Musculoskeletal: No acute osseous abnormality or suspicious osseous lesion. Asymmetrically advanced disc degeneration on the right at L3-4 and on the left at L4-5. IMPRESSION: 1. Interval progression of inflammatory process in the right lower quadrant/adnexa, now with an 8 cm abscess. Progressive wall thickening of the proximal sigmoid colon. Considerations include PID with tubo-ovarian abscess and perforated colonic diverticulitis. Perforated appendicitis is considered less likely. 2. Increased size of a cystic focus in the left adnexa, now 3.2 cm. 3. New trace right pleural effusion. 4. Aortic Atherosclerosis (ICD10-I70.0). These results will be called to the ordering clinician or representative by the Radiologist Assistant, and communication documented in the PACS or [employer]. Electronically Signed   By: Logan Bores M.D.   On: 11/06/2020 14:36   CT IMAGE GUIDED DRAINAGE BY PERCUTANEOUS CATHETER  Result Date: 11/07/2020 INDICATION: 49 year old with right lower quadrant abdominal abscess of uncertain etiology. EXAM: CT-GUIDED PLACEMENT OF ABDOMINAL ABSCESS DRAIN MEDICATIONS: Moderate sedation ANESTHESIA/SEDATION: Fentanyl 200 mcg IV; Versed 4.0 mg IV Moderate Sedation Time:   23 minutes The patient was continuously monitored during the procedure by the interventional radiology nurse under my direct supervision. COMPLICATIONS: None immediate. PROCEDURE: Informed written consent was obtained from the patient after a thorough discussion of the procedural risks, benefits and alternatives. All questions were addressed. A timeout was performed prior to the initiation of the procedure. Patient was placed supine on the CT scanner. Images through the abdomen were obtained. The large air-fluid collection in the right abdomen was identified. The overlying skin was prepped with chlorhexidine and sterile field was created. Maximal barrier sterile technique was utilized including caps, mask, sterile gowns, sterile gloves, sterile drape, hand hygiene and skin antiseptic. Skin was anesthetized using 1% lidocaine. Small incision was made. Using CT guidance, an 18 gauge trocar needle was directed into the air-fluid collection and a combination of purulent fluid and air was aspirated. Superstiff Amplatz wire was advanced into the abscess. Tract was dilated to accommodate a 10.2 [place] multipurpose drain. Approximately 40 mL of purulent fluid was removed. Sample sent for culture. Catheter was attached to suction bulb. Catheter was sutured to skin and a bandage was placed. FINDINGS: Large air-fluid collection in the right lower abdomen. Multiple foci of gas along the right side of the sigmoid colon. There is contrast within the appendix posterior to the abscess. Based on these findings, suspect that the etiology for the abscess is sigmoid diverticulitis. In addition, there may be evidence for fistula on sequence 2 image 21. IMPRESSION: 1. Successful placement of a CT-guided drain within the right abdominal abscess. 40 mL  of purulent fluid was removed. Sample of fluid was sent for culture. 2. Small foci of extraluminal gas around the sigmoid colon with multiple colonic diverticula. Etiology of the right lower  quadrant abscesses most likely related to sigmoid diverticulitis. Electronically Signed   By: Markus Daft M.D.   On: 11/07/2020 12:15    Labs:  CBC: Recent Labs    01/02/20 1512 10/28/20 1700 11/06/20 1807 11/08/20 0256  WBC 7.5 16.1* 15.2* 10.5  HGB 13.7 13.1 12.6 11.0*  HCT 41.1 40.2 39.0 34.6*  PLT 275.0 292 434* 364    COAGS: Recent Labs    11/07/20 0909  INR 1.1    BMP: Recent Labs    01/02/20 1512 10/28/20 1700 11/06/20 1807 11/08/20 0256  NA 138 138 139 137  K 4.4 3.1* 3.2* 3.1*  CL 104 101 101 96*  CO2 27 24 27 29   GLUCOSE 88 108* 98 102*  BUN 8 9 7 6   CALCIUM 8.9 8.6* 8.7* 8.2*  CREATININE 0.91 0.96 0.90 1.02*  GFRNONAA  --  >60 >60 >60    LIVER FUNCTION TESTS: Recent Labs    01/02/20 1512 10/28/20 1700 11/06/20 1807  BILITOT 0.4 0.6 0.3  AST 13 12* 10*  ALT 11 11 8   ALKPHOS 78 96 96  PROT 6.7 8.1 7.9  ALBUMIN 4.2 3.9 3.4*    Assessment and Plan: Pt with hx RLQ abscess of uncertain etiology; s/p drain placement 12/4; afebrile; WBC nl ;hgb stable; creat 1.02, drain fl cx pending; K 3.1-replace; cont drain flushes/OP monitoring; once OP minimal or if clinical status worsens obtain f/u CT   Electronically Signed: D. Rowe Robert, PA-C 11/08/2020, 1:24 PM   I spent a total of 15 minutes at the the patient's bedside AND on the patient's hospital floor or unit, greater than 50% of which was counseling/coordinating care for right lower abdominal abscess drain    Patient ID: Rachelle Hora, female   DOB: Apr 14, 1971, 49 y.o.   MRN: [id number]

## 2020-11-08 NOTE — Plan of Care (Signed)
  Problem: Clinical Measurements: Goal: Ability to maintain clinical measurements within normal limits will improve Outcome: Progressing Goal: Will remain free from infection Outcome: Progressing Goal: Diagnostic test results will improve Outcome: Progressing   Problem: Pain Managment: Goal: General experience of comfort will improve Outcome: Progressing   Problem: Activity: Goal: Risk for activity intolerance will decrease Outcome: Progressing   Problem: Elimination: Goal: Will not experience complications related to urinary retention Outcome: Progressing   Problem: Safety: Goal: Ability to remain free from injury will improve Outcome: Progressing   Problem: Skin Integrity: Goal: Risk for impaired skin integrity will decrease Outcome: Progressing

## 2020-11-09 ENCOUNTER — Other Ambulatory Visit (HOSPITAL_COMMUNITY): Payer: Self-pay | Admitting: General Surgery

## 2020-11-09 LAB — CBC WITH DIFFERENTIAL/PLATELET
Abs Immature Granulocytes: 0.06 10*3/uL (ref 0.00–0.07)
Basophils Absolute: 0 10*3/uL (ref 0.0–0.1)
Basophils Relative: 1 %
Eosinophils Absolute: 0.2 10*3/uL (ref 0.0–0.5)
Eosinophils Relative: 2 %
HCT: 32.4 % — ABNORMAL LOW (ref 36.0–46.0)
Hemoglobin: 10.9 g/dL — ABNORMAL LOW (ref 12.0–15.0)
Immature Granulocytes: 1 %
Lymphocytes Relative: 25 %
Lymphs Abs: 1.9 10*3/uL (ref 0.7–4.0)
MCH: 30.3 pg (ref 26.0–34.0)
MCHC: 33.6 g/dL (ref 30.0–36.0)
MCV: 90 fL (ref 80.0–100.0)
Monocytes Absolute: 0.7 10*3/uL (ref 0.1–1.0)
Monocytes Relative: 9 %
Neutro Abs: 4.7 10*3/uL (ref 1.7–7.7)
Neutrophils Relative %: 62 %
Platelets: 382 10*3/uL (ref 150–400)
RBC: 3.6 MIL/uL — ABNORMAL LOW (ref 3.87–5.11)
RDW: 13.9 % (ref 11.5–15.5)
WBC: 7.5 10*3/uL (ref 4.0–10.5)
nRBC: 0 % (ref 0.0–0.2)

## 2020-11-09 LAB — BASIC METABOLIC PANEL
Anion gap: 10 (ref 5–15)
BUN: 5 mg/dL — ABNORMAL LOW (ref 6–20)
CO2: 27 mmol/L (ref 22–32)
Calcium: 8.3 mg/dL — ABNORMAL LOW (ref 8.9–10.3)
Chloride: 102 mmol/L (ref 98–111)
Creatinine, Ser: 1.06 mg/dL — ABNORMAL HIGH (ref 0.44–1.00)
GFR, Estimated: >60 mL/min (ref >60)
Glucose, Bld: 80 mg/dL (ref 70–99)
Potassium: 3.9 mmol/L (ref 3.5–5.1)
Sodium: 139 mmol/L (ref 135–145)

## 2020-11-09 LAB — MAGNESIUM: Magnesium: 2.1 mg/dL (ref 1.7–2.4)

## 2020-11-09 MED ORDER — SALINE FLUSH 0.9 % IV SOLN: 5.0000 mL | Freq: Every day | INTRAVENOUS | 0 refills | Status: DC

## 2020-11-09 MED ORDER — AMOXICILLIN-POT CLAVULANATE 875-125 MG PO TABS: 1.0000 | ORAL_TABLET | Freq: Two times a day (BID) | ORAL | 0 refills | Status: DC

## 2020-11-09 MED ORDER — OXYCODONE HCL 5 MG PO TABS
5.0000 mg | ORAL_TABLET | ORAL | 0 refills | Status: DC | PRN
Start: 2020-11-09 — End: 2020-12-18

## 2020-11-09 MED ORDER — IBUPROFEN 600 MG PO TABS
600.0000 mg | ORAL_TABLET | Freq: Three times a day (TID) | ORAL | 0 refills | Status: DC
Start: 2020-11-09 — End: 2020-12-11

## 2020-11-09 MED FILL — AMOX-CLAV 875-125 MG TABLET: 875-125 | 12 days supply | Qty: 24 | Fill #0

## 2020-11-09 MED FILL — oxyCODONE HCL 5 MG TABS: 5 | 4 days supply | Qty: 20 | Fill #0

## 2020-11-09 NOTE — Discharge Summary (Signed)
Patient ID: Barbara Yates [id number] 04-07-1971 49 y.o.  Admit date: 11/06/2020 Discharge date: 11/09/2020  Admitting Diagnosis: TOA, PID Pelvic abscess  Discharge Diagnosis Patient Active Problem List   Diagnosis Date Noted  . TOA (tubo-ovarian abscess) 11/06/2020  . Pelvic abscess in female 11/06/2020  . Hyperlipidemia 01/03/2020  . Genital herpes 03/27/2019  . Depression 01/11/2017  . Insomnia 01/11/2017  . Morbid obesity due to excess calories ([place]) 01/07/2016  . Vitamin D deficiency 03/10/2015  . Myofascial pain 03/04/2015  . Chronic back pain 03/04/2015  . Anxiety 10/02/2013  . Fibromyalgia 05/27/2013  diverticulitis with intra-abdominal abscess s/p perc drain placement  Consultants General surgery IR  Reason for Admission: Barbara Yates is a 49 y.o. G2P2 presenting for right lower quadrant pain. Reports new onset nausea/vomiting & diarrhea 10 days ago, thought it was a stomach bug but had no improvement and so went to urgent care, she was diagnosed with PID and sent home on antibiotics. Pain never really improved (stabbing pain in RLQ) and still having some diarrhea (non-bloody) and nausea/vomiting so went to see her PCP for follow up who ordered outpatient CT scan and called and told her to present to hospital when results were back.   Denies other symptoms, no h/o HTN, gastrointestinal issues.  Procedures Percutaneous drain placement, Dr. Anselm Pancoast [facility] Course:  The patient was admitted and placed on abx therapy to cover PID as well as gut coverage on the chance this abscess and process was diverticular in origin.  General surgery was consulted.  IR drain was recommended.  After drain placement, due to quality of output, amount of air in abscess, as well as possible fistula noted with injection of contrast to the colon, this was felt to likely be more diverticular in origin than GYN.  She was narrowed down to zosyn for abx coverage.  Her WBC normalized.   She had her diet advanced as tolerated.  She was overall improving and stable for DC home with drain care.  She will need an outpatient colonoscopy as well as follow up with IR and colorectal.  Physical Exam: Heart: regular Lungs: CTAB Abd: soft, tender as expected in RLQ, drain in place with scant serosang output, +BS, ND  Allergies as of 11/09/2020   No Known Allergies     Medication List    STOP taking these medications   doxycycline 100 MG capsule Commonly known as: VIBRAMYCIN   HYDROcodone-acetaminophen 5-325 MG tablet Commonly known as: NORCO/VICODIN     TAKE these medications   acetaminophen 500 MG tablet Commonly known as: TYLENOL Take 1,000 mg by mouth every 6 (six) hours as needed for moderate pain.   ALPRAZolam 1 MG tablet Commonly known as: XANAX TAKE 1 TABLET EVERY DAY AS NEEDED FOR ANXIETY What changed:   how much to take  how to take this  when to take this  reasons to take this  additional instructions   amoxicillin-clavulanate 875-125 MG tablet Commonly known as: Augmentin Take 1 tablet by mouth 2 (two) times daily for 12 days.   atorvastatin 10 MG tablet Commonly known as: LIPITOR Take 1 tablet (10 mg total) by mouth daily.   citalopram 40 MG tablet Commonly known as: CELEXA Take 1 tablet (40 mg total) by mouth daily.   cyclobenzaprine 10 MG tablet Commonly known as: FLEXERIL TAKE 1 TABLET BY MOUTH AT BEDTIME AS NEEDED FOR SPASMS What changed: See the new instructions.   ibuprofen 600 MG tablet Commonly known  as: ADVIL Take 1 tablet (600 mg total) by mouth 3 (three) times daily.   ondansetron 4 MG disintegrating tablet Commonly known as: Zofran ODT Take 1 tablet (4 mg total) by mouth every 8 (eight) hours as needed for nausea or vomiting.   oxyCODONE 5 MG immediate release tablet Commonly known as: Roxicodone Take 1 tablet (5 mg total) by mouth every 4 (four) hours as needed for moderate pain or severe pain.   Saline Flush 0.9 %  Soln Place 5 mLs into feeding tube daily. Flush drain with 5cc of saline daily   valACYclovir 1000 MG tablet Commonly known as: VALTREX TAKE 1 TABLET BY MOUTH EVERY DAY What changed:   how much to take  how to take this  when to take this  reasons to take this  additional instructions   zolpidem 10 MG tablet Commonly known as: AMBIEN Take 1 tablet (10 mg total) by mouth at bedtime as needed for sleep.         Follow-up Information    [facility], [place] Follow up in 3 week(s).   Specialty: General Surgery Contact information: [place] [id number] [phone]        Markus Daft, MD Follow up in 1 week(s).   Specialties: Interventional Radiology, Radiology Contact information: [address] [place] [address] [phone]        Isaac Bliss, Rayford Halsted, MD Follow up.   Specialty: Internal Medicine Why: as needed Contact information: [address] [place] [address] [phone]               Signed: Saverio Danker, [facility] 11/09/2020, 10:41 AM Please see Amion for pager number during day hours 7:00am-4:30pm, 7-11:30am on Weekends

## 2020-11-09 NOTE — Discharge Instructions (Signed)
Low-Fiber Eating Plan x 4-6 weeks Fiber is found in fruits, vegetables, whole grains, and beans. Eating a diet low in fiber helps to reduce how often you have bowel movements and how much you produce during a bowel movement. A low-fiber eating plan may help your digestive system heal if:  You have certain conditions, such as Crohn's disease or diverticulitis.  You recently had radiation therapy on your pelvis or bowel.  You recently had intestinal surgery.  You have a new surgical opening in your abdomen (colostomy or ileostomy).  Your intestine is narrowed (stricture). Your health care provider will determine how long you need to stay on this diet. Your health care provider may recommend that you work with a diet and nutrition specialist (dietitian). What are tips for following this plan? General guidelines  Follow recommendations from your dietitian about how much fiber you should have each day.  Most people on this eating plan should try to eat less than 10 grams (g) of fiber each day. Your daily fiber goal is _________________ g.  Take vitamin and mineral supplements as told by your health care provider or dietitian. Chewable or liquid forms are best when on this eating plan. Reading food labels  Check food labels for the amount of dietary fiber.  Choose foods that have less than 2 grams of fiber in one serving. Cooking  Use white flour and other allowed grains for baking and cooking.  Cook meat using methods that keep it tender, such as braising or poaching.  Cook eggs until the yolk is completely solid.  Cook with healthy oils, such as olive oil or canola oil. Meal planning   Eat 5-6 small meals throughout the day instead of 3 large meals.  If you are lactose intolerant: ? Choose low-lactose dairy foods. ? Do not eat dairy foods, if told by your dietitian.  Limit fat and oils to less than 8 teaspoons a day.  Eat small portions of desserts. What foods are  allowed? The items listed below may not be a complete list. Talk with your dietitian about what dietary choices are best for you. Grains All bread and crackers made with white flour. Waffles, pancakes, and [place] toast. Bagels. Pretzels. Melba toast, zwieback, and matzoh. Cooked and dried cereals that do not contain whole grains, added fiber, seeds, or dried fruit. CornmealDomenick Gong. Hot and cold cereals made with refined corn, wheat, rice, or oats. Plain pasta and noodles. White rice. Vegetables Well-cooked or canned vegetables without skin, seeds, or stems. Cooked potatoes without skins. Vegetable juice. Fruits Soft-cooked or canned fruits without skin and seeds. Peeled ripe banana. Applesauce. Fruit juice without pulp. Meats and other protein foods Ground meat. Tender cuts of meat or poultry. Eggs. Fish, seafood, and shellfish. Smooth nut butters. Tofu. Dairy All milk products and drinks. Lactose-free milks, including rice, soy, and almond milks. Yogurt without fruit, nuts, chocolate, or granola mix-ins. Sour cream. Cottage cheese. Cheese. Beverages Decaf coffee. Fruit and vegetable juices or smoothies (in small amounts, with no pulp or skins, and with fruits from allowed list). Sports drinks. Herbal tea. Fats and oils Olive oil, canola oil, sunflower oil, flaxseed oil, and grapeseed oil. Mayonnaise. Cream cheese. Margarine. Butter. Sweets and desserts Plain cakes and cookies. Cream pies and pies made with allowed fruits. Pudding. Custard. Fruit gelatin. Sherbet. Popsicles. Ice cream without nuts. Plain hard candy. Honey. Jelly. Molasses. Syrups, including chocolate syrup. Chocolate. Marshmallows. Gumdrops. Seasoning and other foods Bouillon. Broth. Cream soups made from allowed foods. Strained  soup. Casseroles made with allowed foods. Ketchup. Mild mustard. Mild salad dressings. Plain gravies. Vinegar. Spices in moderation. Salt. Sugar. What foods are not allowed? The items listed below may  not be a complete list. Talk with your dietitian about what dietary choices are best for you. Grains Whole wheat and whole grain breads and crackers. Multigrain breads and crackers. Rye bread. Whole grain or multigrain cereals. Cereals with nuts, raisins, or coconut. Bran. Coarse wheat cereals. Granola. High-fiber cereals. Cornmeal or corn bread. Whole grain pasta. Wild or brown rice. Quinoa. Popcorn. Buckwheat. Wheat germ. Vegetables Potato skins. Raw or undercooked vegetables. All beans and bean sprouts. Cooked greens. Corn. Peas. Cabbage. Beets. Broccoli. Brussels sprouts. Cauliflower. Mushrooms. Onions. Peppers. Parsnips. Okra. Sauerkraut. Fruit Raw or dried fruit. Berries. Fruit juice with pulp. Prune juice. Meats and other protein foods Tough, fibrous meats with gristle. Fatty meat. Poultry with skin. Fried meat, Sales executive, or fish. Deli or lunch meats. Sausage, bacon, and hot dogs. Nuts and chunky nut butter. Dried peas, beans, and lentils. Dairy Yogurt with fruit, nuts, chocolate, or granola mix-ins. Beverages Caffeinated coffee and teas. Fats and oils Avocado. Coconut. Sweets and desserts Desserts, cookies, or candies that contain nuts or coconut. Dried fruit. Jams and preserves with seeds. Marmalade. Any dessert made with fruits or grains that are not allowed. Seasoning and other foods Corn tortilla chips. Soups made with vegetables or grains that are not allowed. Relish. Horseradish. Angie Fava. Olives. Summary  Most people on a low-fiber eating plan should eat less than 10 grams of fiber a day. Follow recommendations from your dietitian about how much fiber you should have each day.  Always check food labels to see the dietary fiber content of packaged foods. In general, a low-fiber food will have fewer than 2 grams of fiber per serving.  In general, try to avoid whole grains, raw fruits and vegetables, dried fruit, tough cuts of meat, nuts, and seeds.  Take a vitamin and mineral  supplement as told by your health care provider or dietitian. This information is not intended to replace advice given to you by your health care provider. Make sure you discuss any questions you have with your health care provider. Document Revised: 03/15/2019 Document Reviewed: 01/24/2017 Elsevier Patient Education  [place].   High-Fiber Diet Fiber, also called dietary fiber, is a type of carbohydrate that is found in fruits, vegetables, whole grains, and beans. A high-fiber diet can have many health benefits. Your health care provider may recommend a high-fiber diet to help:  Prevent constipation. Fiber can make your bowel movements more regular.  Lower your cholesterol.  Relieve the following conditions: ? Swelling of veins in the anus (hemorrhoids). ? Swelling and irritation (inflammation) of specific areas of the digestive tract (uncomplicated diverticulosis). ? A problem of the large intestine (colon) that sometimes causes pain and diarrhea (irritable bowel syndrome, IBS).  Prevent overeating as part of a weight-loss plan.  Prevent heart disease, type 2 diabetes, and certain cancers. What is my plan? The recommended daily fiber intake in grams (g) includes:  38 g for men age 69 or younger.  30 g for men over age 9.  50 g for women age 59 or younger.  21 g for women over age 94. You can get the recommended daily intake of dietary fiber by:  Eating a variety of fruits, vegetables, grains, and beans.  Taking a fiber supplement, if it is not possible to get enough fiber through your diet. What do I need  to know about a high-fiber diet?  It is better to get fiber through food sources rather than from fiber supplements. There is not a lot of research about how effective supplements are.  Always check the fiber content on the nutrition facts label of any prepackaged food. Look for foods that contain 5 g of fiber or more per serving.  Talk with a diet and  nutrition specialist (dietitian) if you have questions about specific foods that are recommended or not recommended for your medical condition, especially if those foods are not listed below.  Gradually increase how much fiber you consume. If you increase your intake of dietary fiber too quickly, you may have bloating, cramping, or gas.  Drink plenty of water. Water helps you to digest fiber. What are tips for following this plan?  Eat a wide variety of high-fiber foods.  Make sure that half of the grains that you eat each day are whole grains.  Eat breads and cereals that are made with whole-grain flour instead of refined flour or white flour.  Eat brown rice, bulgur wheat, or millet instead of white rice.  Start the day with a breakfast that is high in fiber, such as a cereal that contains 5 g of fiber or more per serving.  Use beans in place of meat in soups, salads, and pasta dishes.  Eat high-fiber snacks, such as berries, raw vegetables, nuts, and popcorn.  Choose whole fruits and vegetables instead of processed forms like juice or sauce. What foods can I eat?  Fruits Berries. Pears. Apples. Oranges. Avocado. Prunes and raisins. Dried figs. Vegetables Sweet potatoes. Spinach. Kale. Artichokes. Cabbage. Broccoli. Cauliflower. Green peas. Carrots. Squash. Grains Whole-grain breads. Multigrain cereal. Oats and oatmeal. Brown rice. Barley. Bulgur wheat. [place]. Quinoa. Bran muffins. Popcorn. Rye wafer crackers. Meats and other proteins [place], kidney, and pinto beans. Soybeans. Split peas. Lentils. Nuts and seeds. Dairy Fiber-fortified yogurt. Beverages Fiber-fortified soy milk. Fiber-fortified orange juice. Other foods Fiber bars. The items listed above may not be a complete list of recommended foods and beverages. Contact a dietitian for more options. What foods are not recommended? Fruits Fruit juice. Cooked, strained fruit. Vegetables Fried potatoes. Canned vegetables.  Well-cooked vegetables. Grains White bread. Pasta made with refined flour. White rice. Meats and other proteins Fatty cuts of meat. Fried chicken or fried fish. Dairy Milk. Yogurt. Cream cheese. Sour cream. Fats and oils Butters. Beverages Soft drinks. Other foods Cakes and pastries. The items listed above may not be a complete list of foods and beverages to avoid. Contact a dietitian for more information. Summary  Fiber is a type of carbohydrate. It is found in fruits, vegetables, whole grains, and beans.  There are many health benefits of eating a high-fiber diet, such as preventing constipation, lowering blood cholesterol, helping with weight loss, and reducing your risk of heart disease, diabetes, and certain cancers.  Gradually increase your intake of fiber. Increasing too fast can result in cramping, bloating, and gas. Drink plenty of water while you increase your fiber.  The best sources of fiber include whole fruits and vegetables, whole grains, nuts, seeds, and beans. This information is not intended to replace advice given to you by your health care provider. Make sure you discuss any questions you have with your health care provider. Document Revised: 09/25/2017 Document Reviewed: 09/25/2017 Elsevier Patient Education  [place].   Diverticulitis  Diverticulitis is when small pockets in your large intestine (colon) get infected or swollen. This causes stomach  pain and watery poop (diarrhea). These pouches are called diverticula. They form in people who have a condition called diverticulosis. Follow these instructions at home: Medicines  Take over-the-counter and prescription medicines only as told by your doctor. These include: ? Antibiotics. ? Pain medicines. ? Fiber pills. ? Probiotics. ? Stool softeners.  Do not drive or use heavy machinery while taking prescription pain medicine.  If you were prescribed an antibiotic, take it as told. Do not stop  taking it even if you feel better. General instructions   Follow a diet as told by your doctor.  When you feel better, your doctor may tell you to change your diet. You may need to eat a lot of fiber. Fiber makes it easier to poop (have bowel movements). Healthy foods with fiber include: ? Berries. ? Beans. ? Lentils. ? Green vegetables.  Exercise 3 or more times a week. Aim for 30 minutes each time. Exercise enough to sweat and make your heart beat faster.  Keep all follow-up visits as told. This is important. You may need to have an exam of the large intestine. This is called a colonoscopy. Contact a doctor if:  Your pain does not get better.  You have a hard time eating or drinking.  You are not pooping like normal. Get help right away if:  Your pain gets worse.  Your problems do not get better.  Your problems get worse very fast.  You have a fever.  You throw up (vomit) more than one time.  You have poop that is: ? Bloody. ? Black. ? Tarry. Summary  Diverticulitis is when small pockets in your large intestine (colon) get infected or swollen.  Take medicines only as told by your doctor.  Follow a diet as told by your doctor. This information is not intended to replace advice given to you by your health care provider. Make sure you discuss any questions you have with your health care provider. Document Revised: 11/03/2017 Document Reviewed: 12/08/2016 Elsevier Patient Education  [place] this sheet to all of your post-operative appointments while you have your drains.  Please measure your drains by CC's or ML's.  Make sure you drain and measure your JP Drains 3 times per day.  At the end of each day, add up totals for the left side and add up totals for the right side.    ( 9 am )     ( 3 pm )        ( 9 pm )                Date L  R  L  R  L  R  Total L/R

## 2020-11-09 NOTE — Plan of Care (Signed)
  Problem: Coping: Goal: Level of anxiety will decrease Outcome: Progressing   Problem: Clinical Measurements: Goal: Ability to maintain clinical measurements within normal limits will improve Outcome: Progressing   Problem: Clinical Measurements: Goal: Will remain free from infection Outcome: Progressing   Problem: Pain Managment: Goal: General experience of comfort will improve Outcome: Progressing   Problem: Education: Goal: Knowledge of General Education information will improve Description: Including pain rating scale, medication(s)/side effects and non-pharmacologic comfort measures Outcome: Progressing   Problem: Elimination: Goal: Will not experience complications related to bowel motility Outcome: Progressing   Problem: Elimination: Goal: Will not experience complications related to urinary retention Outcome: Progressing   Problem: Safety: Goal: Ability to remain free from injury will improve Outcome: Progressing   Problem: Skin Integrity: Goal: Risk for impaired skin integrity will decrease Outcome: Progressing

## 2020-11-09 NOTE — Progress Notes (Signed)
Pt tolerated soft diet. Denies nausea/vomiting.  AVS given and reviewed with pt. Medications delivered to bedside and discussed with pt. All questions answered to satisfaction. Pt verbalized understanding of information given, including JP drain care. Pt escorted off the unit with all belongings via wheelchair by staff member.

## 2020-11-10 ENCOUNTER — Other Ambulatory Visit: Payer: Self-pay | Admitting: Surgery

## 2020-11-10 DIAGNOSIS — N7093 Salpingitis and oophoritis, unspecified: Secondary | ICD-10-CM

## 2020-11-10 LAB — AEROBIC/ANAEROBIC CULTURE W GRAM STAIN (SURGICAL/DEEP WOUND)

## 2020-11-17 ENCOUNTER — Encounter: Payer: Self-pay | Admitting: Radiology

## 2020-11-17 ENCOUNTER — Ambulatory Visit
Admission: RE | Admit: 2020-11-17 | Discharge: 2020-11-17 | Disposition: A | Discharge status: Home / Self Care | Payer: BC Managed Care – PPO | Source: Ambulatory Visit | Attending: Physician Assistant | Admitting: Physician Assistant

## 2020-11-17 ENCOUNTER — Ambulatory Visit
Admission: RE | Admit: 2020-11-17 | Discharge: 2020-11-17 | Disposition: A | Discharge status: Home / Self Care | Payer: BC Managed Care – PPO | Source: Ambulatory Visit | Attending: Surgery | Admitting: Surgery

## 2020-11-17 DIAGNOSIS — I700 Atherosclerosis of aorta: Secondary | ICD-10-CM | POA: Diagnosis not present

## 2020-11-17 DIAGNOSIS — K5730 Diverticulosis of large intestine without perforation or abscess without bleeding: Secondary | ICD-10-CM | POA: Diagnosis not present

## 2020-11-17 DIAGNOSIS — J90 Pleural effusion, not elsewhere classified: Secondary | ICD-10-CM | POA: Diagnosis not present

## 2020-11-17 DIAGNOSIS — N7093 Salpingitis and oophoritis, unspecified: Secondary | ICD-10-CM

## 2020-11-17 DIAGNOSIS — N852 Hypertrophy of uterus: Secondary | ICD-10-CM | POA: Diagnosis not present

## 2020-11-17 HISTORY — PX: IR RADIOLOGIST EVAL & MGMT: IMG5224

## 2020-11-17 MED ORDER — IOPAMIDOL (ISOVUE-300) INJECTION 61%
100.0000 mL | Freq: Once | INTRAVENOUS | Status: AC | PRN
  Administered 2020-11-17: 100 mL via INTRAVENOUS

## 2020-11-17 NOTE — Progress Notes (Signed)
Chief Complaint: Patient was seen in consultation today for abscess drain   Referring Physician(s): Dr. Verita Lamb  History of Present Illness: Barbara Yates is a 49 y.o. female with abdominopelvic abscess. Initially this was thought be TOA given her recent bout of PID as well. However, over the course of her hospital stay it was then felt more likely to be diverticular in origin given presence of diverticulum and inflammatory sigmoid colon changes on her CT. Perc drain was placed on 12/4 and she was eventually discharged home. She is here for follow up CT and drain injection. She reports doing well, minimal pain. Not much output from drain, barely getting out what she is flushing in. Output has been serosanguinous. States she is still taking her abx and has follow up with surgeons in a few weeks.   Past Medical History:  Diagnosis Date  . Anxiety   . Chronic back pain   . Depression   . Hyperlipidemia   . Insomnia   . Migraine headache   . Myofascial pain     Past Surgical History:  Procedure Laterality Date  . CERVICAL SPINE SURGERY    . TUBAL LIGATION      Allergies: Patient has no known allergies.  Medications: Prior to Admission medications   Medication Sig Start Date End Date Taking? Authorizing Provider  acetaminophen (TYLENOL) 500 MG tablet Take 1,000 mg by mouth every 6 (six) hours as needed for moderate pain.    [provider]  ALPRAZolam (XANAX) 1 MG tablet TAKE 1 TABLET EVERY DAY AS NEEDED FOR ANXIETY Patient taking differently: Take 1 mg by mouth daily as needed for anxiety.  09/16/20   Isaac Bliss, Rayford Halsted, MD  amoxicillin-clavulanate (AUGMENTIN) 875-125 MG tablet Take 1 tablet by mouth 2 (two) times daily for 12 days. 11/09/20 11/21/20  Saverio Danker, PA-C  atorvastatin (LIPITOR) 10 MG tablet Take 1 tablet (10 mg total) by mouth daily. 11/06/20   Isaac Bliss, Rayford Halsted, MD  citalopram (CELEXA) 40 MG tablet Take 1 tablet (40 mg  total) by mouth daily. 06/30/20   Isaac Bliss, Rayford Halsted, MD  cyclobenzaprine (FLEXERIL) 10 MG tablet TAKE 1 TABLET BY MOUTH AT BEDTIME AS NEEDED FOR SPASMS Patient taking differently: Take 10 mg by mouth daily as needed for muscle spasms.  09/16/20   Isaac Bliss, Rayford Halsted, MD  ibuprofen (ADVIL) 600 MG tablet Take 1 tablet (600 mg total) by mouth 3 (three) times daily. 11/09/20   Saverio Danker, PA-C  ondansetron (ZOFRAN ODT) 4 MG disintegrating tablet Take 1 tablet (4 mg total) by mouth every 8 (eight) hours as needed for nausea or vomiting. 10/28/20   Walisiewicz, Verline Lema E, PA-C  oxyCODONE (ROXICODONE) 5 MG immediate release tablet Take 1 tablet (5 mg total) by mouth every 4 (four) hours as needed for moderate pain or severe pain. 11/09/20   Saverio Danker, PA-C  Sodium Chloride Flush (SALINE FLUSH) 0.9 % SOLN Place 5 mLs into feeding tube daily. Flush drain with 5cc of saline daily 11/09/20   Saverio Danker, PA-C  valACYclovir (VALTREX) 1000 MG tablet TAKE 1 TABLET BY MOUTH EVERY DAY Patient taking differently: Take 1,000 mg by mouth daily as needed (breakouts).  07/21/20   Isaac Bliss, Rayford Halsted, MD  zolpidem (AMBIEN) 10 MG tablet Take 1 tablet (10 mg total) by mouth at bedtime as needed for sleep. 07/07/20   Isaac Bliss, Rayford Halsted, MD     Family History  Problem Relation Age of Onset  .  Hypertension Paternal Grandmother   . CVA Paternal Grandmother   . Alzheimer's disease Paternal Grandmother   . Cancer Paternal Grandfather        Pancreatic  . Pancreatic cancer Maternal Grandfather     Social History   Socioeconomic History  . Marital status: Significant Other    Spouse name: Not on file  . Number of children: Not on file  . Years of education: Not on file  . Highest education level: Not on file  Occupational History  . Not on file  Tobacco Use  . Smoking status: Former Smoker    Packs/day: 0.25    Years: 3.00    Pack years: 0.75    Types: Cigarettes    Start  date: 08/13/1995    Quit date: 09/27/1998    Years since quitting: 22.1  . Smokeless tobacco: Never Used  . Tobacco comment: quit in 1999  Vaping Use  . Vaping Use: Never used  Substance and Sexual Activity  . Alcohol use: Yes    Alcohol/week: 4.0 standard drinks    Types: 4 Shots of liquor per week  . Drug use: No  . Sexual activity: Yes    Birth control/protection: Surgical  Other Topics Concern  . Not on file  Social History Narrative   ** Merged History Encounter **       Social Determinants of Health   Financial Resource Strain: Not on file  Food Insecurity: Not on file  Transportation Needs: Not on file  Physical Activity: Not on file  Stress: Not on file  Social Connections: Not on file     Review of Systems: A 12 point ROS discussed and pertinent positives are indicated in the HPI above.  All other systems are negative.  Review of Systems  Vital Signs: LMP 11/06/2020   Physical Exam Lungs: CTA without w/r/r Heart: Regular Abdomen: soft, NT, NDD Drain:RLQ drain intact, site clean, NT Scant pink output in bulb  Drain injection performed showed no definitive communication to any bowel. Resistance met when trying to inject more than 8-9 mL contrast.    Imaging: CT Abdomen Pelvis W Contrast  Result Date: 11/06/2020 CLINICAL DATA:  Persistent right lower quadrant pain. History of PID and tubo-ovarian abscess. EXAM: CT ABDOMEN AND PELVIS WITH CONTRAST TECHNIQUE: Multidetector CT imaging of the abdomen and pelvis was performed using the standard protocol following bolus administration of intravenous contrast. CONTRAST:  174m OMNIPAQUE IOHEXOL 300 MG/ML  SOLN COMPARISON:  10/28/2020 FINDINGS: Lower chest: New trace right pleural effusion and mild right lower lobe atelectasis. Hepatobiliary: Unchanged subcentimeter hypodensity in the right hepatic lobe, too small to fully characterize. Unremarkable gallbladder. Pancreas: Unremarkable. Spleen: Unremarkable.  Adrenals/Urinary Tract: Unremarkable adrenal glands. No evidence of renal mass, calculi, or hydronephrosis. Unremarkable bladder. Stomach/Bowel: The stomach is unremarkable. There is no evidence of bowel obstruction. There is diverticulosis of the descending and sigmoid colon. Wall thickening of the proximal sigmoid colon has increased with increased surrounding inflammation. A complex inflammatory process in the right adnexa has progressed from the prior CT, and there is a new 8.2 x 4.6 cm gas and fluid collection extending laterally in the right lower quadrant anterior to the cecum. The terminal ileum is displaced posteriorly and compressed by the fluid collection. Regional inflammation involves a small portion of the distal ileum. The appendix is located posterior to the fluid collection, is opacified with oral contrast material, and measures 5-6 mm in diameter. The regional inflammatory process involves the mid and distal aspects of  the appendix. Vascular/Lymphatic: Mild abdominal aortic atherosclerosis without aneurysm. Mild interval enlargement of subcentimeter short axis lymph nodes in the right lower quadrant mesentery and retroperitoneum, favored to be reactive. Reproductive: Increased size of cystic focus in the left adnexa, now 3.2 x 3.2 cm. Anterior uterine fibroid as previously seen. Other: Small volume intraperitoneal free fluid. Musculoskeletal: No acute osseous abnormality or suspicious osseous lesion. Asymmetrically advanced disc degeneration on the right at L3-4 and on the left at L4-5. IMPRESSION: 1. Interval progression of inflammatory process in the right lower quadrant/adnexa, now with an 8 cm abscess. Progressive wall thickening of the proximal sigmoid colon. Considerations include PID with tubo-ovarian abscess and perforated colonic diverticulitis. Perforated appendicitis is considered less likely. 2. Increased size of a cystic focus in the left adnexa, now 3.2 cm. 3. New trace right pleural  effusion. 4. Aortic Atherosclerosis (ICD10-I70.0). These results will be called to the ordering clinician or representative by the Radiologist Assistant, and communication documented in the PACS or [employer]. Electronically Signed   By: Logan Bores M.D.   On: 11/06/2020 14:36   CT ABDOMEN PELVIS W CONTRAST  Result Date: 10/28/2020 CLINICAL DATA:  Diffuse abdominal pain. EXAM: CT ABDOMEN AND PELVIS WITH CONTRAST TECHNIQUE: Multidetector CT imaging of the abdomen and pelvis was performed using the standard protocol following bolus administration of intravenous contrast. CONTRAST:  130m OMNIPAQUE IOHEXOL 300 MG/ML  SOLN COMPARISON:  None. FINDINGS: Lower chest: No acute abnormality. Hepatobiliary: A 7 mm well-defined focus of parenchymal low attenuation is seen within the posterolateral aspect of the right lobe the liver. No gallstones, gallbladder wall thickening, or biliary dilatation. Pancreas: Unremarkable. No pancreatic ductal dilatation or surrounding inflammatory changes. Spleen: Normal in size without focal abnormality. Adrenals/Urinary Tract: Adrenal glands are unremarkable. Kidneys are normal, without renal calculi, focal lesion, or hydronephrosis. Bladder is unremarkable. Stomach/Bowel: Stomach is within normal limits. No evidence of bowel dilatation. Numerous diverticula are seen throughout the large bowel. Mild thickening of a short segment of proximal sigmoid colon is also seen (axial CT images 69 through 74, CT series number 2). The appendix is not clearly identified. A 6 mm thick curvilinear area of soft tissue attenuation is seen within the posteromedial aspect of the right lower quadrant. This appears to extend from the posterior aspect of the cecum, toward the midline of the posterior pelvis (axial CT images 63 through 69, CT series number 2/sagittal reformatted images 99 through 121, CT series number 6). A 1.0 cm focus of air is seen within the anterior aspect of the right lower  quadrant (axial CT image 60, CT series number 2). This is adjacent to the anteromedial aspect of the cecum. Vascular/Lymphatic: There is mild calcification of the abdominal aorta and bilateral common iliac arteries, without evidence of aneurysmal dilatation. No enlarged abdominal or pelvic lymph nodes. Reproductive: The uterine fundus is enlarged and heterogeneous in appearance. A 2.7 cm x 1.7 cm cyst is seen within the anterior aspect of the left adnexa. A mild amount of mesenteric inflammatory fat stranding is seen within the medial aspect of the right lower quadrant. This extends to the right adnexa. A 1.0 cm diameter fluid-filled tubular appearing area is seen within this region which may represent a dilated fallopian tube (axial CT image 69, CT series number 2). An adjacent 3.9 cm x 2.3 cm well-defined area of fluid attenuation is seen, posteriorly (axial CT images 64 through 73, CT series number 2). Other: No abdominal wall hernia or abnormality. No abdominopelvic ascites. Musculoskeletal: No  acute or significant osseous findings. IMPRESSION: 1. Inflammatory process within the right lower quadrant/right hemipelvis which is suspicious for pelvic inflammatory disease with an associated right-sided tubo-ovarian abscess. The presence of an inflamed, perforated appendix cannot completely be excluded. Correlation with pelvic ultrasound is recommended. 2. Colonic diverticulosis. 3. Mild thickening of a short segment of proximal sigmoid colon which may be secondary to poor bowel distention. Correlation with follow-up abdomen pelvis CT is recommended to exclude the presence of a soft tissue mass is recommended. 4. Enlarged, heterogeneous uterine fundus, which may represent a fibroid uterus. 5. Left adnexal cyst, likely ovarian in origin. 6. Small hepatic cyst versus hemangioma. 7. Aortic atherosclerosis. Aortic Atherosclerosis (ICD10-I70.0). Electronically Signed   By: Virgina Norfolk M.D.   On: 10/28/2020 19:35    US PELVIC COMPLETE W TRANSVAGINAL AND TORSION R/O  Result Date: 10/28/2020 CLINICAL DATA:  Pelvic pain EXAM: TRANSABDOMINAL AND TRANSVAGINAL ULTRASOUND OF PELVIS DOPPLER ULTRASOUND OF OVARIES TECHNIQUE: Both transabdominal and transvaginal ultrasound examinations of the pelvis were performed. Transabdominal technique was performed for global imaging of the pelvis including uterus, ovaries, adnexal regions, and pelvic cul-de-sac. It was necessary to proceed with endovaginal exam following the transabdominal exam to visualize the ovaries. Color and duplex Doppler ultrasound was utilized to evaluate blood flow to the ovaries. COMPARISON:  CT from the same day FINDINGS: Uterus Measurements: 8.9 x 6.5 x 8.3 cm = volume: 250 mL. There is a uterine fibroid measuring 5.9 x 4.6 x 6.4 cm. Endometrium Thickness: 5.6 mm.  No focal abnormality visualized. Right ovary Measurements: 7.8 x 4.7 x 5.5 cm = volume: 105 mL. There is a complex cystic mass measuring 3.8 x 3.5 x 3.9 cm. This mass demonstrates lace-like echogenic foci. In the region the right adnexa there is a tubular fluid-filled structure Left ovary Measurements: 3.6 x 2.4 x 3 cm = volume: 13 mL. Normal appearance/no adnexal mass. Pulsed Doppler evaluation of both ovaries demonstrates normal low-resistance arterial and venous waveforms. Other findings No abnormal free fluid. IMPRESSION: 1. Overall findings are most consistent with PID with a right-sided pyosalpinx. 2. No evidence for ovarian torsion. 3. Fibroid uterus. Electronically Signed   By: Constance Holster M.D.   On: 10/28/2020 20:56   CT IMAGE GUIDED DRAINAGE BY PERCUTANEOUS CATHETER  Result Date: 11/07/2020 INDICATION: 49 year old with right lower quadrant abdominal abscess of uncertain etiology. EXAM: CT-GUIDED PLACEMENT OF ABDOMINAL ABSCESS DRAIN MEDICATIONS: Moderate sedation ANESTHESIA/SEDATION: Fentanyl 200 mcg IV; Versed 4.0 mg IV Moderate Sedation Time:  23 minutes The patient was  continuously monitored during the procedure by the interventional radiology nurse under my direct supervision. COMPLICATIONS: None immediate. PROCEDURE: Informed written consent was obtained from the patient after a thorough discussion of the procedural risks, benefits and alternatives. All questions were addressed. A timeout was performed prior to the initiation of the procedure. Patient was placed supine on the CT scanner. Images through the abdomen were obtained. The large air-fluid collection in the right abdomen was identified. The overlying skin was prepped with chlorhexidine and sterile field was created. Maximal barrier sterile technique was utilized including caps, mask, sterile gowns, sterile gloves, sterile drape, hand hygiene and skin antiseptic. Skin was anesthetized using 1% lidocaine. Small incision was made. Using CT guidance, an 18 gauge trocar needle was directed into the air-fluid collection and a combination of purulent fluid and air was aspirated. Superstiff Amplatz wire was advanced into the abscess. Tract was dilated to accommodate a 10.2 [place] multipurpose drain. Approximately 40 mL of purulent fluid was removed.  Sample sent for culture. Catheter was attached to suction bulb. Catheter was sutured to skin and a bandage was placed. FINDINGS: Large air-fluid collection in the right lower abdomen. Multiple foci of gas along the right side of the sigmoid colon. There is contrast within the appendix posterior to the abscess. Based on these findings, suspect that the etiology for the abscess is sigmoid diverticulitis. In addition, there may be evidence for fistula on sequence 2 image 21. IMPRESSION: 1. Successful placement of a CT-guided drain within the right abdominal abscess. 40 mL of purulent fluid was removed. Sample of fluid was sent for culture. 2. Small foci of extraluminal gas around the sigmoid colon with multiple colonic diverticula. Etiology of the right lower quadrant abscesses most  likely related to sigmoid diverticulitis. Electronically Signed   By: Markus Daft M.D.   On: 11/07/2020 12:15    Labs:  CBC: Recent Labs    10/28/20 1700 11/06/20 1807 11/08/20 0256 11/09/20 0313  WBC 16.1* 15.2* 10.5 7.5  HGB 13.1 12.6 11.0* 10.9*  HCT 40.2 39.0 34.6* 32.4*  PLT 292 434* 364 382    COAGS: Recent Labs    11/07/20 0909  INR 1.1    BMP: Recent Labs    10/28/20 1700 11/06/20 1807 11/08/20 0256 11/09/20 0313  NA 138 139 137 139  K 3.1* 3.2* 3.1* 3.9  CL 101 101 96* 102  CO2 24 27 29 27   GLUCOSE 108* 98 102* 80  BUN 9 7 6  5*  CALCIUM 8.6* 8.7* 8.2* 8.3*  CREATININE 0.96 0.90 1.02* 1.06*  GFRNONAA >60 >60 >60 >60    LIVER FUNCTION TESTS: Recent Labs    01/02/20 1512 10/28/20 1700 11/06/20 1807  BILITOT 0.4 0.6 0.3  AST 13 12* 10*  ALT 11 11 8   ALKPHOS 78 96 96  PROT 6.7 8.1 7.9  ALBUMIN 4.2 3.9 3.4*    TUMOR MARKERS: No results for input(s): AFPTM, CEA, CA199, CHROMGRNA in the last 8760 hours.  Assessment and Plan: Abdominopelvic abscess s/p perc drain 12/4 CT today shows resolution of abscess, see full report. Drain injection shows definite fistula, see full report. Given lack of output and imaging findings, drain was removed today without difficulty. Dressing applied. Encouraged pt to keep f/u with surgical team.  Thank you for this interesting consult.  I greatly enjoyed meeting SHATERRICA TERRITO and look forward to participating in their care.  A copy of this report was sent to the requesting provider on this date.  Electronically Signed: Ascencion Dike 11/17/2020, 1:28 PM   I spent a total of 30 minutes in face to face in clinical consultation, greater than 50% of which was counseling/coordinating care for abscess drain care

## 2020-11-23 ENCOUNTER — Other Ambulatory Visit: Payer: Self-pay | Admitting: Internal Medicine

## 2020-11-23 DIAGNOSIS — G4700 Insomnia, unspecified: Secondary | ICD-10-CM

## 2020-12-01 ENCOUNTER — Other Ambulatory Visit: Payer: Self-pay | Admitting: Internal Medicine

## 2020-12-01 DIAGNOSIS — F419 Anxiety disorder, unspecified: Secondary | ICD-10-CM

## 2020-12-02 ENCOUNTER — Other Ambulatory Visit: Payer: Self-pay | Admitting: Internal Medicine

## 2020-12-02 DIAGNOSIS — F419 Anxiety disorder, unspecified: Secondary | ICD-10-CM

## 2020-12-03 ENCOUNTER — Other Ambulatory Visit: Payer: Self-pay | Admitting: Internal Medicine

## 2020-12-03 MED ORDER — ALPRAZOLAM 1 MG PO TABS: ORAL_TABLET | ORAL | 0 refills | Status: DC

## 2020-12-09 DIAGNOSIS — K5792 Diverticulitis of intestine, part unspecified, without perforation or abscess without bleeding: Secondary | ICD-10-CM | POA: Diagnosis not present

## 2020-12-10 ENCOUNTER — Other Ambulatory Visit: Payer: Self-pay

## 2020-12-10 ENCOUNTER — Inpatient Hospital Stay (HOSPITAL_COMMUNITY)
Admission: EM | Admit: 2020-12-10 | Discharge: 2020-12-17 | DRG: 872 | Disposition: A | Discharge status: Home / Self Care | Payer: BC Managed Care – PPO | Attending: Surgery | Admitting: Surgery

## 2020-12-10 ENCOUNTER — Encounter (HOSPITAL_COMMUNITY): Payer: Self-pay | Admitting: Emergency Medicine

## 2020-12-10 DIAGNOSIS — R1031 Right lower quadrant pain: Secondary | ICD-10-CM | POA: Diagnosis not present

## 2020-12-10 DIAGNOSIS — D72829 Elevated white blood cell count, unspecified: Secondary | ICD-10-CM | POA: Diagnosis not present

## 2020-12-10 DIAGNOSIS — L0291 Cutaneous abscess, unspecified: Secondary | ICD-10-CM | POA: Diagnosis not present

## 2020-12-10 DIAGNOSIS — K5732 Diverticulitis of large intestine without perforation or abscess without bleeding: Secondary | ICD-10-CM | POA: Diagnosis not present

## 2020-12-10 DIAGNOSIS — Z79899 Other long term (current) drug therapy: Secondary | ICD-10-CM | POA: Diagnosis not present

## 2020-12-10 DIAGNOSIS — A419 Sepsis, unspecified organism: Secondary | ICD-10-CM | POA: Diagnosis not present

## 2020-12-10 DIAGNOSIS — G43909 Migraine, unspecified, not intractable, without status migrainosus: Secondary | ICD-10-CM | POA: Diagnosis present

## 2020-12-10 DIAGNOSIS — E785 Hyperlipidemia, unspecified: Secondary | ICD-10-CM | POA: Diagnosis present

## 2020-12-10 DIAGNOSIS — R109 Unspecified abdominal pain: Secondary | ICD-10-CM | POA: Diagnosis not present

## 2020-12-10 DIAGNOSIS — F419 Anxiety disorder, unspecified: Secondary | ICD-10-CM | POA: Diagnosis present

## 2020-12-10 DIAGNOSIS — G8929 Other chronic pain: Secondary | ICD-10-CM | POA: Diagnosis present

## 2020-12-10 DIAGNOSIS — I10 Essential (primary) hypertension: Secondary | ICD-10-CM | POA: Diagnosis not present

## 2020-12-10 DIAGNOSIS — M549 Dorsalgia, unspecified: Secondary | ICD-10-CM | POA: Diagnosis not present

## 2020-12-10 DIAGNOSIS — Z87891 Personal history of nicotine dependence: Secondary | ICD-10-CM | POA: Diagnosis not present

## 2020-12-10 DIAGNOSIS — R188 Other ascites: Secondary | ICD-10-CM | POA: Diagnosis present

## 2020-12-10 DIAGNOSIS — Z8249 Family history of ischemic heart disease and other diseases of the circulatory system: Secondary | ICD-10-CM

## 2020-12-10 DIAGNOSIS — K5720 Diverticulitis of large intestine with perforation and abscess without bleeding: Secondary | ICD-10-CM | POA: Diagnosis present

## 2020-12-10 DIAGNOSIS — G4700 Insomnia, unspecified: Secondary | ICD-10-CM | POA: Diagnosis not present

## 2020-12-10 DIAGNOSIS — Z20822 Contact with and (suspected) exposure to covid-19: Secondary | ICD-10-CM | POA: Diagnosis present

## 2020-12-10 DIAGNOSIS — R197 Diarrhea, unspecified: Secondary | ICD-10-CM | POA: Diagnosis not present

## 2020-12-10 DIAGNOSIS — F32A Depression, unspecified: Secondary | ICD-10-CM | POA: Diagnosis not present

## 2020-12-10 DIAGNOSIS — K651 Peritoneal abscess: Secondary | ICD-10-CM

## 2020-12-10 DIAGNOSIS — M5136 Other intervertebral disc degeneration, lumbar region: Secondary | ICD-10-CM | POA: Diagnosis not present

## 2020-12-10 DIAGNOSIS — K7689 Other specified diseases of liver: Secondary | ICD-10-CM | POA: Diagnosis not present

## 2020-12-10 DIAGNOSIS — Z978 Presence of other specified devices: Secondary | ICD-10-CM | POA: Diagnosis not present

## 2020-12-10 DIAGNOSIS — E876 Hypokalemia: Secondary | ICD-10-CM | POA: Diagnosis not present

## 2020-12-10 DIAGNOSIS — K5780 Diverticulitis of intestine, part unspecified, with perforation and abscess without bleeding: Secondary | ICD-10-CM | POA: Diagnosis not present

## 2020-12-10 DIAGNOSIS — K6389 Other specified diseases of intestine: Secondary | ICD-10-CM | POA: Diagnosis not present

## 2020-12-10 DIAGNOSIS — R112 Nausea with vomiting, unspecified: Secondary | ICD-10-CM | POA: Diagnosis not present

## 2020-12-10 HISTORY — DX: Diverticulitis of intestine, part unspecified, without perforation or abscess without bleeding: K57.92

## 2020-12-10 LAB — COMPREHENSIVE METABOLIC PANEL
ALT: 13 U/L (ref 0–44)
AST: 14 U/L — ABNORMAL LOW (ref 15–41)
Albumin: 3.9 g/dL (ref 3.5–5.0)
Alkaline Phosphatase: 100 U/L (ref 38–126)
Anion gap: 14 (ref 5–15)
BUN: 5 mg/dL — ABNORMAL LOW (ref 6–20)
CO2: 22 mmol/L (ref 22–32)
Calcium: 9.1 mg/dL (ref 8.9–10.3)
Chloride: 100 mmol/L (ref 98–111)
Creatinine, Ser: 0.99 mg/dL (ref 0.44–1.00)
GFR, Estimated: >60 mL/min (ref >60)
Glucose, Bld: 137 mg/dL — ABNORMAL HIGH (ref 70–99)
Potassium: 3.4 mmol/L — ABNORMAL LOW (ref 3.5–5.1)
Sodium: 136 mmol/L (ref 135–145)
Total Bilirubin: 1.2 mg/dL (ref 0.3–1.2)
Total Protein: 7.6 g/dL (ref 6.5–8.1)

## 2020-12-10 LAB — URINALYSIS, ROUTINE W REFLEX MICROSCOPIC
Bilirubin Urine: NEGATIVE
Glucose, UA: NEGATIVE mg/dL
Hgb urine dipstick: NEGATIVE
Ketones, ur: NEGATIVE mg/dL
Leukocytes,Ua: NEGATIVE
Nitrite: NEGATIVE
Protein, ur: NEGATIVE mg/dL
Specific Gravity, Urine: 1.016 (ref 1.005–1.030)
pH: 8 (ref 5.0–8.0)

## 2020-12-10 LAB — CBC
HCT: 43.6 % (ref 36.0–46.0)
Hemoglobin: 14.2 g/dL (ref 12.0–15.0)
MCH: 29.8 pg (ref 26.0–34.0)
MCHC: 32.6 g/dL (ref 30.0–36.0)
MCV: 91.6 fL (ref 80.0–100.0)
Platelets: 322 10*3/uL (ref 150–400)
RBC: 4.76 MIL/uL (ref 3.87–5.11)
RDW: 14.5 % (ref 11.5–15.5)
WBC: 21.4 10*3/uL — ABNORMAL HIGH (ref 4.0–10.5)
nRBC: 0 % (ref 0.0–0.2)

## 2020-12-10 LAB — LIPASE, BLOOD: Lipase: 28 U/L (ref 11–51)

## 2020-12-10 NOTE — ED Triage Notes (Signed)
Pt reports noting bellyache yesterday. Pt reports increased pain around 2pm. Pt c/o nausea/vomiting/diarrhea since 3 pm today.

## 2020-12-11 ENCOUNTER — Other Ambulatory Visit: Payer: Self-pay

## 2020-12-11 ENCOUNTER — Emergency Department (HOSPITAL_COMMUNITY): Payer: BC Managed Care – PPO

## 2020-12-11 DIAGNOSIS — L0291 Cutaneous abscess, unspecified: Secondary | ICD-10-CM | POA: Diagnosis not present

## 2020-12-11 DIAGNOSIS — Z87891 Personal history of nicotine dependence: Secondary | ICD-10-CM | POA: Diagnosis not present

## 2020-12-11 DIAGNOSIS — E876 Hypokalemia: Secondary | ICD-10-CM | POA: Diagnosis present

## 2020-12-11 DIAGNOSIS — G4700 Insomnia, unspecified: Secondary | ICD-10-CM | POA: Diagnosis present

## 2020-12-11 DIAGNOSIS — K5732 Diverticulitis of large intestine without perforation or abscess without bleeding: Secondary | ICD-10-CM | POA: Diagnosis not present

## 2020-12-11 DIAGNOSIS — E785 Hyperlipidemia, unspecified: Secondary | ICD-10-CM

## 2020-12-11 DIAGNOSIS — F32A Depression, unspecified: Secondary | ICD-10-CM | POA: Diagnosis present

## 2020-12-11 DIAGNOSIS — G8929 Other chronic pain: Secondary | ICD-10-CM | POA: Diagnosis present

## 2020-12-11 DIAGNOSIS — R188 Other ascites: Secondary | ICD-10-CM | POA: Diagnosis present

## 2020-12-11 DIAGNOSIS — A419 Sepsis, unspecified organism: Principal | ICD-10-CM

## 2020-12-11 DIAGNOSIS — I10 Essential (primary) hypertension: Secondary | ICD-10-CM | POA: Diagnosis present

## 2020-12-11 DIAGNOSIS — Z79899 Other long term (current) drug therapy: Secondary | ICD-10-CM | POA: Diagnosis not present

## 2020-12-11 DIAGNOSIS — Z8249 Family history of ischemic heart disease and other diseases of the circulatory system: Secondary | ICD-10-CM | POA: Diagnosis not present

## 2020-12-11 DIAGNOSIS — R112 Nausea with vomiting, unspecified: Secondary | ICD-10-CM | POA: Diagnosis present

## 2020-12-11 DIAGNOSIS — K5720 Diverticulitis of large intestine with perforation and abscess without bleeding: Secondary | ICD-10-CM | POA: Diagnosis present

## 2020-12-11 DIAGNOSIS — Z20822 Contact with and (suspected) exposure to covid-19: Secondary | ICD-10-CM | POA: Diagnosis present

## 2020-12-11 DIAGNOSIS — F419 Anxiety disorder, unspecified: Secondary | ICD-10-CM

## 2020-12-11 DIAGNOSIS — G43909 Migraine, unspecified, not intractable, without status migrainosus: Secondary | ICD-10-CM | POA: Diagnosis present

## 2020-12-11 DIAGNOSIS — M549 Dorsalgia, unspecified: Secondary | ICD-10-CM | POA: Diagnosis present

## 2020-12-11 LAB — CBC WITH DIFFERENTIAL/PLATELET
Abs Immature Granulocytes: 0.05 10*3/uL (ref 0.00–0.07)
Basophils Absolute: 0.1 10*3/uL (ref 0.0–0.1)
Basophils Relative: 0 %
Eosinophils Absolute: 0 10*3/uL (ref 0.0–0.5)
Eosinophils Relative: 0 %
HCT: 44.2 % (ref 36.0–46.0)
Hemoglobin: 14 g/dL (ref 12.0–15.0)
Immature Granulocytes: 0 %
Lymphocytes Relative: 7 %
Lymphs Abs: 1.1 10*3/uL (ref 0.7–4.0)
MCH: 29.3 pg (ref 26.0–34.0)
MCHC: 31.7 g/dL (ref 30.0–36.0)
MCV: 92.5 fL (ref 80.0–100.0)
Monocytes Absolute: 0.8 10*3/uL (ref 0.1–1.0)
Monocytes Relative: 5 %
Neutro Abs: 15.6 10*3/uL — ABNORMAL HIGH (ref 1.7–7.7)
Neutrophils Relative %: 88 %
Platelets: 282 10*3/uL (ref 150–400)
RBC: 4.78 MIL/uL (ref 3.87–5.11)
RDW: 14.7 % (ref 11.5–15.5)
WBC: 17.7 10*3/uL — ABNORMAL HIGH (ref 4.0–10.5)
nRBC: 0 % (ref 0.0–0.2)

## 2020-12-11 LAB — I-STAT BETA HCG BLOOD, ED (MC, WL, AP ONLY): I-stat hCG, quantitative: <5 m[IU]/mL (ref <5)

## 2020-12-11 LAB — SARS CORONAVIRUS 2 (TAT 6-24 HRS): SARS Coronavirus 2: NEGATIVE

## 2020-12-11 LAB — LACTIC ACID, PLASMA: Lactic Acid, Venous: 1.1 mmol/L (ref 0.5–1.9)

## 2020-12-11 MED ORDER — IOHEXOL 350 MG/ML SOLN
100.0000 mL | Freq: Once | INTRAVENOUS | Status: AC | PRN
  Administered 2020-12-11: 100 mL via INTRAVENOUS

## 2020-12-11 MED ORDER — HYDROMORPHONE HCL 1 MG/ML IJ SOLN
1.0000 mg | INTRAMUSCULAR | Status: DC | PRN
  Administered 2020-12-11 – 2020-12-17 (×22): 1 mg via INTRAVENOUS
  Filled 2020-12-11 (×23): qty 1

## 2020-12-11 MED ORDER — MORPHINE SULFATE (PF) 4 MG/ML IV SOLN
4.0000 mg | Freq: Once | INTRAVENOUS | Status: AC
  Administered 2020-12-11: 4 mg via INTRAVENOUS
  Filled 2020-12-11: qty 1

## 2020-12-11 MED ORDER — HYDROMORPHONE HCL 1 MG/ML IJ SOLN
1.0000 mg | INTRAMUSCULAR | Status: DC | PRN
  Administered 2020-12-11: 1 mg via INTRAVENOUS
  Filled 2020-12-11: qty 1

## 2020-12-11 MED ORDER — ONDANSETRON HCL 4 MG PO TABS: 4.0000 mg | ORAL_TABLET | Freq: Four times a day (QID) | ORAL | Status: DC | PRN

## 2020-12-11 MED ORDER — HYDRALAZINE HCL 20 MG/ML IJ SOLN: 10.0000 mg | INTRAMUSCULAR | Status: DC | PRN

## 2020-12-11 MED ORDER — CITALOPRAM HYDROBROMIDE 40 MG PO TABS
40.0000 mg | ORAL_TABLET | Freq: Every day | ORAL | Status: DC
Start: 2020-12-11 — End: 2020-12-18
  Administered 2020-12-11 – 2020-12-17 (×7): 40 mg via ORAL
  Filled 2020-12-11 (×3): qty 1
  Filled 2020-12-11: qty 4
  Filled 2020-12-11 (×3): qty 1

## 2020-12-11 MED ORDER — PIPERACILLIN-TAZOBACTAM 3.375 G IVPB
3.3750 g | Freq: Three times a day (TID) | INTRAVENOUS | Status: DC
  Administered 2020-12-11 – 2020-12-16 (×15): 3.375 g via INTRAVENOUS
  Filled 2020-12-11 (×14): qty 50

## 2020-12-11 MED ORDER — ALPRAZOLAM 0.5 MG PO TABS: 1.0000 mg | ORAL_TABLET | Freq: Every day | ORAL | Status: DC | PRN

## 2020-12-11 MED ORDER — LACTATED RINGERS IV BOLUS
1000.0000 mL | Freq: Once | INTRAVENOUS | Status: AC
  Administered 2020-12-11: 1000 mL via INTRAVENOUS

## 2020-12-11 MED ORDER — ZOLPIDEM TARTRATE 5 MG PO TABS
10.0000 mg | ORAL_TABLET | Freq: Every evening | ORAL | Status: DC | PRN
  Administered 2020-12-11 – 2020-12-16 (×6): 10 mg via ORAL
  Filled 2020-12-11 (×6): qty 2

## 2020-12-11 MED ORDER — IOHEXOL 9 MG/ML PO SOLN
ORAL | Status: AC
  Filled 2020-12-11: qty 500

## 2020-12-11 MED ORDER — PIPERACILLIN-TAZOBACTAM 3.375 G IVPB 30 MIN: 3.3750 g | Freq: Four times a day (QID) | INTRAVENOUS | Status: DC

## 2020-12-11 MED ORDER — SODIUM CHLORIDE 0.9% FLUSH
3.0000 mL | Freq: Two times a day (BID) | INTRAVENOUS | Status: DC
  Administered 2020-12-14 – 2020-12-17 (×5): 3 mL via INTRAVENOUS

## 2020-12-11 MED ORDER — ONDANSETRON HCL 4 MG/2ML IJ SOLN
4.0000 mg | Freq: Once | INTRAMUSCULAR | Status: AC
  Administered 2020-12-11: 4 mg via INTRAVENOUS
  Filled 2020-12-11: qty 2

## 2020-12-11 MED ORDER — POTASSIUM CHLORIDE CRYS ER 20 MEQ PO TBCR
20.0000 meq | EXTENDED_RELEASE_TABLET | ORAL | Status: AC
  Administered 2020-12-11: 20 meq via ORAL
  Filled 2020-12-11: qty 1

## 2020-12-11 MED ORDER — IOHEXOL 9 MG/ML PO SOLN
500.0000 mL | ORAL | Status: AC
  Administered 2020-12-11: 500 mL via ORAL

## 2020-12-11 MED ORDER — ENOXAPARIN SODIUM 40 MG/0.4ML ~~LOC~~ SOLN
40.0000 mg | Freq: Every day | SUBCUTANEOUS | Status: DC
  Administered 2020-12-11 – 2020-12-16 (×5): 40 mg via SUBCUTANEOUS
  Filled 2020-12-11 (×7): qty 0.4

## 2020-12-11 MED ORDER — ONDANSETRON HCL 4 MG/2ML IJ SOLN
4.0000 mg | Freq: Four times a day (QID) | INTRAMUSCULAR | Status: DC | PRN
  Administered 2020-12-11 – 2020-12-13 (×3): 4 mg via INTRAVENOUS
  Filled 2020-12-11 (×3): qty 2

## 2020-12-11 MED ORDER — ATORVASTATIN CALCIUM 10 MG PO TABS
10.0000 mg | ORAL_TABLET | Freq: Every day | ORAL | Status: DC
  Administered 2020-12-11 – 2020-12-17 (×7): 10 mg via ORAL
  Filled 2020-12-11 (×7): qty 1

## 2020-12-11 MED ORDER — SODIUM CHLORIDE 0.9 % IV SOLN: INTRAVENOUS | Status: DC

## 2020-12-11 MED ORDER — ACETAMINOPHEN 650 MG RE SUPP: 650.0000 mg | Freq: Four times a day (QID) | RECTAL | Status: DC | PRN

## 2020-12-11 MED ORDER — ACETAMINOPHEN 325 MG PO TABS: 650.0000 mg | ORAL_TABLET | Freq: Four times a day (QID) | ORAL | Status: DC | PRN

## 2020-12-11 MED ORDER — PIPERACILLIN-TAZOBACTAM 3.375 G IVPB 30 MIN
3.3750 g | Freq: Once | INTRAVENOUS | Status: AC
  Administered 2020-12-11: 3.375 g via INTRAVENOUS
  Filled 2020-12-11: qty 50

## 2020-12-11 MED ORDER — OXYCODONE-ACETAMINOPHEN 5-325 MG PO TABS
1.0000 | ORAL_TABLET | Freq: Four times a day (QID) | ORAL | Status: DC | PRN
  Administered 2020-12-11 – 2020-12-15 (×10): 1 via ORAL
  Filled 2020-12-11 (×10): qty 1

## 2020-12-11 NOTE — ED Notes (Signed)
Antibiotics given before blood cultures were ordered.

## 2020-12-11 NOTE — Plan of Care (Signed)

## 2020-12-11 NOTE — H&P (Addendum)
History and Physical    Barbara Yates [id number] DOB: [birth year] DOA: 12/10/2020  Referring MD/NP/PA: Varney Biles, MD PCP: Isaac Bliss, Rayford Halsted, MD  Patient coming from: Home  Chief Complaint: Vomiting, diarrhea, and stomach pain  I have personally briefly reviewed patient's old medical records in [place]   HPI: Barbara Yates is a 50 y.o. female with medical history significant of hyperlipidemia, diverticulitis, chronic back pain, depression, and anxiety presents with complaints of vomiting, diarrhea, and stomach pain.  Symptoms initially started 2 days ago with a mild pain in the right lower quadrant of her abdomen.  However, yesterday symptoms worsened to the point where she developed nausea vomiting, and diarrhea.  Emesis and diarrhea were both nonbloody in appearance.  Abdominal pain became severe pain in the right lower quadrant of her abdomen.  She tried taking Tylenol at home, but was unable to keep it down.  Symptoms similar to when patient was admitted into the hospital last month for perforated colonic diverticulitis with 8 cm abscess in the right lower quadrant vs. tubo-ovarian abscess.  She is treated with IV antibiotics and IR placed a drain.   Patient had followed up with Dr. Nadeen Landau of surgery in the outpatient setting and reports that the plan was to have resection of that part of the colon sometime in March or April.  This was in order to allow the patient to have a colonoscopy.  ED Course: Upon admission into the emergency department patient was seen to be afebrile, heart rate 72-1 31, respirations 16-22, blood pressures elevated up to 163/97, and O2 saturations maintained.  Labs from 1/6 significant for WBC 21.4, potassium 3.4, lipase 28, and lactic acid 1.1.  Urinalysis was negative for any acute abnormalities.  CT scan of the abdomen and pelvis with contrast revealed inflammatory mass in the right lower quadrant consistent with peridiverticular  phlegmon with normal-appearing appendix.  Patient was given 1 L of lactated Ringers, Zosyn, antiemetics, and pain medication IV.  TRH called to admit.  Review of Systems  Constitutional: Positive for malaise/fatigue.  HENT: Negative for hearing loss and nosebleeds.   Eyes: Negative for photophobia and pain.  Respiratory: Negative for cough and shortness of breath.   Cardiovascular: Negative for chest pain.  Gastrointestinal: Positive for abdominal pain, diarrhea, nausea and vomiting.  Genitourinary: Negative for dysuria and hematuria.  Musculoskeletal: Negative for falls.  Skin: Negative for rash.  Neurological: Negative for focal weakness and loss of consciousness.  Psychiatric/Behavioral: Negative for memory loss and substance abuse.    Past Medical History:  Diagnosis Date  . Anxiety   . Chronic back pain   . Depression   . Diverticulitis   . Hyperlipidemia   . Insomnia   . Migraine headache   . Myofascial pain     Past Surgical History:  Procedure Laterality Date  . CERVICAL SPINE SURGERY    . IR RADIOLOGIST EVAL & MGMT  11/17/2020  . TUBAL LIGATION       reports that she quit smoking about 22 years ago. Her smoking use included cigarettes. She started smoking about 25 years ago. She has a 0.75 pack-year smoking history. She has never used smokeless tobacco. She reports current alcohol use of about 4.0 standard drinks of alcohol per week. She reports that she does not use drugs.  No Known Allergies  Family History  Problem Relation Age of Onset  . Hypertension Paternal Grandmother   . CVA Paternal Grandmother   . Alzheimer's  disease Paternal Grandmother   . Cancer Paternal Grandfather        Pancreatic  . Pancreatic cancer Maternal Grandfather     Prior to Admission medications   Medication Sig Start Date End Date Taking? Authorizing Provider  acetaminophen (TYLENOL) 500 MG tablet Take 1,000 mg by mouth every 6 (six) hours as needed for moderate pain.     [provider]  ALPRAZolam Duanne Moron) 1 MG tablet TAKE 1 TABLET EVERY DAY AS NEEDED FOR ANXIETY 12/03/20   Isaac Bliss, Rayford Halsted, MD  atorvastatin (LIPITOR) 10 MG tablet Take 1 tablet (10 mg total) by mouth daily. 11/06/20   Isaac Bliss, Rayford Halsted, MD  citalopram (CELEXA) 40 MG tablet TAKE 1 TABLET BY MOUTH EVERY DAY 12/03/20   Isaac Bliss, Rayford Halsted, MD  cyclobenzaprine (FLEXERIL) 10 MG tablet TAKE 1 TABLET BY MOUTH AT BEDTIME AS NEEDED FOR SPASMS 12/02/20   Isaac Bliss, Rayford Halsted, MD  ibuprofen (ADVIL) 600 MG tablet Take 1 tablet (600 mg total) by mouth 3 (three) times daily. 11/09/20   Saverio Danker, PA-C  ondansetron (ZOFRAN ODT) 4 MG disintegrating tablet Take 1 tablet (4 mg total) by mouth every 8 (eight) hours as needed for nausea or vomiting. 10/28/20   Walisiewicz, Verline Lema E, PA-C  oxyCODONE (ROXICODONE) 5 MG immediate release tablet Take 1 tablet (5 mg total) by mouth every 4 (four) hours as needed for moderate pain or severe pain. 11/09/20   Saverio Danker, PA-C  Sodium Chloride Flush (SALINE FLUSH) 0.9 % SOLN Place 5 mLs into feeding tube daily. Flush drain with 5cc of saline daily 11/09/20   Saverio Danker, PA-C  valACYclovir (VALTREX) 1000 MG tablet TAKE 1 TABLET BY MOUTH EVERY DAY Patient taking differently: Take 1,000 mg by mouth daily as needed (breakouts).  07/21/20   Isaac Bliss, Rayford Halsted, MD  zolpidem (AMBIEN) 10 MG tablet TAKE 1 TABLET BY MOUTH AT BEDTIME AS NEEDED FOR SLEEP. 12/02/20   Isaac Bliss, Rayford Halsted, MD    Physical Exam:  Constitutional: Middle-aged woman currently in no acute distress Vitals:   12/11/20 0504 12/11/20 0624 12/11/20 0804 12/11/20 1010  BP: (!) 132/94 (!) 155/86 (!) 161/105 (!) 163/97  Pulse: 78 86 81 89  Resp: 16 19 20 17   Temp: 98.2 F (36.8 C) 98.2 F (36.8 C)    TempSrc:      SpO2: 98% 99% 99% 95%   Eyes: PERRL, lids and conjunctivae normal ENMT: Mucous membranes are moist. Posterior pharynx clear of any  exudate or lesions.  Neck: normal, supple, no masses, no thyromegaly Respiratory: clear to auscultation bilaterally, no wheezing, no crackles. Normal respiratory effort. No accessory muscle use.  Cardiovascular: Regular rate and rhythm, no murmurs / rubs / gallops. No extremity edema. 2+ pedal pulses. No carotid bruits.  Abdomen: Moderate tenderness palpation of the right lower quadrant.  Bowel sounds present. Musculoskeletal: no clubbing / cyanosis. No joint deformity upper and lower extremities. Good ROM, no contractures. Normal muscle tone.  Skin: no rashes, lesions, ulcers. No induration Neurologic: CN 2-12 grossly intact. Sensation intact, DTR normal. Strength 5/5 in all 4.  Psychiatric: Normal judgment and insight. Alert and oriented x 3. Normal mood.     Labs on Admission: I have personally reviewed following labs and imaging studies  CBC: Recent Labs  Lab 12/10/20 1713 12/11/20 0804  WBC 21.4* 17.7*  NEUTROABS  --  15.6*  HGB 14.2 14.0  HCT 43.6 44.2  MCV 91.6 92.5  PLT 322 282  Basic Metabolic Panel: Recent Labs  Lab 12/10/20 1713  NA 136  K 3.4*  CL 100  CO2 22  GLUCOSE 137*  BUN 5*  CREATININE 0.99  CALCIUM 9.1   GFR: CrCl cannot be calculated (Unknown ideal weight.). Liver Function Tests: Recent Labs  Lab 12/10/20 1713  AST 14*  ALT 13  ALKPHOS 100  BILITOT 1.2  PROT 7.6  ALBUMIN 3.9   Recent Labs  Lab 12/10/20 1713  LIPASE 28   No results for input(s): AMMONIA in the last 168 hours. Coagulation Profile: No results for input(s): INR, PROTIME in the last 168 hours. Cardiac Enzymes: No results for input(s): CKTOTAL, CKMB, CKMBINDEX, TROPONINI in the last 168 hours. BNP (last 3 results) No results for input(s): PROBNP in the last 8760 hours. HbA1C: No results for input(s): HGBA1C in the last 72 hours. CBG: No results for input(s): GLUCAP in the last 168 hours. Lipid Profile: No results for input(s): CHOL, HDL, LDLCALC, TRIG, CHOLHDL,  LDLDIRECT in the last 72 hours. Thyroid Function Tests: No results for input(s): TSH, T4TOTAL, FREET4, T3FREE, THYROIDAB in the last 72 hours. Anemia Panel: No results for input(s): VITAMINB12, FOLATE, FERRITIN, TIBC, IRON, RETICCTPCT in the last 72 hours. Urine analysis:    Component Value Date/Time   COLORURINE YELLOW 12/10/2020 2200   APPEARANCEUR CLEAR 12/10/2020 2200   LABSPEC 1.016 12/10/2020 2200   PHURINE 8.0 12/10/2020 2200   GLUCOSEU NEGATIVE 12/10/2020 2200   HGBUR NEGATIVE 12/10/2020 2200   BILIRUBINUR NEGATIVE 12/10/2020 2200   KETONESUR NEGATIVE 12/10/2020 2200   PROTEINUR NEGATIVE 12/10/2020 2200   UROBILINOGEN 0.2 01/13/2011 2107   NITRITE NEGATIVE 12/10/2020 2200   LEUKOCYTESUR NEGATIVE 12/10/2020 2200   Sepsis Labs: No results found for this or any previous visit (from the past 240 hour(s)).   Radiological Exams on Admission: CT ABDOMEN PELVIS W CONTRAST  Result Date: 12/11/2020 CLINICAL DATA:  Right lower quadrant abdominal pain. History of diverticulitis with abscess. EXAM: CT ABDOMEN AND PELVIS WITH CONTRAST TECHNIQUE: Multidetector CT imaging of the abdomen and pelvis was performed using the standard protocol following bolus administration of intravenous contrast. CONTRAST:  184mL OMNIPAQUE IOHEXOL 350 MG/ML SOLN COMPARISON:  11/17/2020, 11/06/2020 and 10/28/2020. FINDINGS: Lower chest: Clear lung bases.  Heart normal in size. Hepatobiliary: Stable 7 mm segment 6 cyst. Liver otherwise unremarkable. Normal gallbladder. No bile duct dilation. Pancreas: Unremarkable. No pancreatic ductal dilatation or surrounding inflammatory changes. Spleen: Normal in size without focal abnormality. Adrenals/Urinary Tract: Adrenal glands are unremarkable. Kidneys are normal, without renal calculi, focal lesion, or hydronephrosis. Bladder is unremarkable. Stomach/Bowel: Heterogeneous inflammatory mass abuts the medial margin of the cecum, containing a single small air bubble. There is a  small tubular structure containing air that extends above this inflammatory collection, 5 mm in diameter consistent with the appendiceal tip. The remainder of the appendix appears confluent with the right lower quadrant inflammatory mass. The mass measures approximately 5.5 x 4.0 x 4.1 cm. There is heterogeneous attenuation that extends from the right aspect of the mid sigmoid colon to the inflammatory mass. This portion of the sigmoid colon shows mild adjacent inflammatory stranding and evidence of eccentric wall thickening as well as numerous diverticula. When compared to the prior exam, the inflammatory mass is increased and the percutaneous drainage catheter has been removed. Remainder of the colon shows additional diverticula throughout the left colon, but no other evidence of inflammation. There is mild wall thickening of the distal ileum as it passes over the right lower quadrant inflammatory  process. No other small bowel abnormality. Normal stomach. Vascular/Lymphatic: Scattered subcentimeter retroperitoneal lymph nodes, stable. Minimal aortic atherosclerosis. Reproductive: Prominent but stable uterus.  No ovarian masses. Other: No free air.  No ascites.  No hernia. Musculoskeletal: No fracture or acute finding. Degenerative changes of the lower lumbar spine most evident at L3-L4. No bone lesions. IMPRESSION: 1. Inflammatory mass in the right lower quadrant abutting the medial aspect of the cecum and contiguous with portions of the appendix. This appears to extend from the right margin of the mid sigmoid colon, consistent with a peridiverticular phlegmon. No defined drainable abscess is visualized, however. The distal appendix is seen above this inflammatory mass, normal in diameter. 2. No other acute or abnormalities. 3. Minor aortic atherosclerosis. Electronically Signed   By: Lajean Manes M.D.   On: 12/11/2020 10:08    CT imaging: Independently reviewed.  Inflammatory mass of the right lower quadrant  appreciated  Assessment/Plan Sepsis secondary to phlegmon: Acute.  On admission patient was noted to be tachycardic and tachypneic with WBC elevated up to 21.4. Lactic acid was 1.1 after initial therapies were started .  Patient was placed on empiric antibiotics of Zosyn IV.  Patient had just recently been hospitalizedlast month for suspected perforated colonic diverticulitis with 8 cm abscess in the right lower quadrant requiring IR drain placement and IV antibiotics.  Cultures during that hospitalization were positive for moderate Streptococcus constellatus and bacteroides thetaiotaomicron.  Blood cultures had not initially been obtained. -Admit to MedSurg bed -N.p.o. -Monitor intake and output -Check blood cultures -Pain control -Continue Zosyn IV -Gentle IV fluids of normal saline at 75 mL/h -General surgery consulted, will follow-up for further recommendation  Nausea, vomiting, and diarrhea: Suspect secondary to above. -Symptomatic treatment as needed  Hypokalemia: Acute.  Potassium mildly low at 3.4.  Suspect secondary to nausea vomiting, and diarrhea. -Give 20 mEq of potassium chloride p.o. -Continue to monitor and replace as needed  Hyperlipidemia: Medications include atorvastatin 10 mg daily. -Continue statin  Anxiety and depression: Home medications include Celexa 40 mg daily and Xanax 1 mg as needed for anxiety -Continue home regimen   DVT prophylaxis: Lovenox Code Status: Full Family Communication: Daughter updated at bedside Disposition Plan: Likely discharge home in 2 to 3 days Consults called: General surgery Admission status: Inpatient, require more than 2 midnight stay for IV antibiotic  Norval Morton MD [facility]   If 7PM-7AM, please contact night-coverage   12/11/2020, 10:35 AM

## 2020-12-11 NOTE — ED Provider Notes (Signed)
[facility] EMERGENCY DEPARTMENT Provider Note   CSN: [id number] Arrival date & time: 12/10/20  1612   History Chief Complaint  Patient presents with  . Abdominal Pain    Barbara Yates is a 50 y.o. female.  The history is provided by the patient.  Abdominal Pain She has history of hyperlipidemia, diverticulitis with abscess and comes in complaining of right lower abdominal pain which started 2 nights ago and has been getting progressively worse.  Pain was initially periumbilical and is moved to the right lower quadrant.  Is worse with any movement.  There is associated nausea, vomiting, diarrhea.  Pain is similar to what she felt with her diverticular abscess.  She currently rates pain at 7/10.  She denies fever, chills, sweats.  Last menses was 2 weeks ago.  She is status post tubal ligation.  Past Medical History:  Diagnosis Date  . Anxiety   . Chronic back pain   . Depression   . Diverticulitis   . Hyperlipidemia   . Insomnia   . Migraine headache   . Myofascial pain     Patient Active Problem List   Diagnosis Date Noted  . TOA (tubo-ovarian abscess) 11/06/2020  . Pelvic abscess in female 11/06/2020  . Hyperlipidemia 01/03/2020  . Genital herpes 03/27/2019  . Depression 01/11/2017  . Insomnia 01/11/2017  . Morbid obesity due to excess calories ([place]) 01/07/2016  . Vitamin D deficiency 03/10/2015  . Myofascial pain 03/04/2015  . Chronic back pain 03/04/2015  . Anxiety 10/02/2013  . Fibromyalgia 05/27/2013    Past Surgical History:  Procedure Laterality Date  . CERVICAL SPINE SURGERY    . IR RADIOLOGIST EVAL & MGMT  11/17/2020  . TUBAL LIGATION       OB History    Gravida  2   Para  2   Term  2   Preterm      AB      Living  2     SAB      IAB      Ectopic      Multiple      Live Births  2           Family History  Problem Relation Age of Onset  . Hypertension Paternal Grandmother   . CVA Paternal Grandmother    . Alzheimer's disease Paternal Grandmother   . Cancer Paternal Grandfather        Pancreatic  . Pancreatic cancer Maternal Grandfather     Social History   Tobacco Use  . Smoking status: Former Smoker    Packs/day: 0.25    Years: 3.00    Pack years: 0.75    Types: Cigarettes    Start date: 08/13/1995    Quit date: 09/27/1998    Years since quitting: 22.2  . Smokeless tobacco: Never Used  . Tobacco comment: quit in 1999  Vaping Use  . Vaping Use: Never used  Substance Use Topics  . Alcohol use: Yes    Alcohol/week: 4.0 standard drinks    Types: 4 Shots of liquor per week  . Drug use: No    Home Medications Prior to Admission medications   Medication Sig Start Date End Date Taking? Authorizing Provider  acetaminophen (TYLENOL) 500 MG tablet Take 1,000 mg by mouth every 6 (six) hours as needed for moderate pain.    [provider]  ALPRAZolam Duanne Moron) 1 MG tablet TAKE 1 TABLET EVERY DAY AS NEEDED FOR ANXIETY 12/03/20  Isaac Bliss, Rayford Halsted, MD  atorvastatin (LIPITOR) 10 MG tablet Take 1 tablet (10 mg total) by mouth daily. 11/06/20   Isaac Bliss, Rayford Halsted, MD  citalopram (CELEXA) 40 MG tablet TAKE 1 TABLET BY MOUTH EVERY DAY 12/03/20   Isaac Bliss, Rayford Halsted, MD  cyclobenzaprine (FLEXERIL) 10 MG tablet TAKE 1 TABLET BY MOUTH AT BEDTIME AS NEEDED FOR SPASMS 12/02/20   Isaac Bliss, Rayford Halsted, MD  ibuprofen (ADVIL) 600 MG tablet Take 1 tablet (600 mg total) by mouth 3 (three) times daily. 11/09/20   Saverio Danker, PA-C  ondansetron (ZOFRAN ODT) 4 MG disintegrating tablet Take 1 tablet (4 mg total) by mouth every 8 (eight) hours as needed for nausea or vomiting. 10/28/20   Walisiewicz, Verline Lema E, PA-C  oxyCODONE (ROXICODONE) 5 MG immediate release tablet Take 1 tablet (5 mg total) by mouth every 4 (four) hours as needed for moderate pain or severe pain. 11/09/20   Saverio Danker, PA-C  Sodium Chloride Flush (SALINE FLUSH) 0.9 % SOLN Place 5 mLs into feeding  tube daily. Flush drain with 5cc of saline daily 11/09/20   Saverio Danker, PA-C  valACYclovir (VALTREX) 1000 MG tablet TAKE 1 TABLET BY MOUTH EVERY DAY Patient taking differently: Take 1,000 mg by mouth daily as needed (breakouts).  07/21/20   Isaac Bliss, Rayford Halsted, MD  zolpidem (AMBIEN) 10 MG tablet TAKE 1 TABLET BY MOUTH AT BEDTIME AS NEEDED FOR SLEEP. 12/02/20   Isaac Bliss, Rayford Halsted, MD    Allergies    Patient has no known allergies.  Review of Systems   Review of Systems  Gastrointestinal: Positive for abdominal pain.  All other systems reviewed and are negative.   Physical Exam Updated Vital Signs BP (!) 155/86 (BP Location: Left Arm)   Pulse 86   Temp 98.2 F (36.8 C)   Resp 19   SpO2 99%   Physical Exam Vitals and nursing note reviewed.   50 year old female, appears uncomfortable, but is in no acute distress. Vital signs are significant for elevated blood pressure. Oxygen saturation is 99%, which is normal. Head is normocephalic and atraumatic. PERRLA, EOMI. Oropharynx is clear. Neck is nontender and supple without adenopathy or JVD. Back is nontender and there is no CVA tenderness. Lungs are clear without rales, wheezes, or rhonchi. Chest is nontender. Heart has regular rate and rhythm without murmur. Abdomen is soft, flat, with marked right lower quadrant tenderness with voluntary guarding and rebound tenderness present.  There are no masses or hepatosplenomegaly and peristalsis is hypoactive. Extremities have no cyanosis or edema, full range of motion is present. Skin is warm and dry without rash. Neurologic: Mental status is normal, cranial nerves are intact, there are no motor or sensory deficits.  ED Results / Procedures / Treatments   Labs (all labs ordered are listed, but only abnormal results are displayed) Labs Reviewed  COMPREHENSIVE METABOLIC PANEL - Abnormal; Notable for the following components:      Result Value   Potassium 3.4 (*)     Glucose, Bld 137 (*)    BUN 5 (*)    AST 14 (*)    All other components within normal limits  CBC - Abnormal; Notable for the following components:   WBC 21.4 (*)    All other components within normal limits  LIPASE, BLOOD  URINALYSIS, ROUTINE W REFLEX MICROSCOPIC  LACTIC ACID, PLASMA  CBC WITH DIFFERENTIAL/PLATELET  I-STAT BETA HCG BLOOD, ED ([facility], [facility], [facility] ONLY)    EKG None  Radiology No results found.  Procedures Procedures  Medications Ordered in ED Medications  ondansetron (ZOFRAN) injection 4 mg (has no administration in time range)  morphine 4 MG/ML injection 4 mg (has no administration in time range)  lactated ringers bolus 1,000 mL (has no administration in time range)    ED Course  I have reviewed the triage vital signs and the nursing notes.  Pertinent labs & imaging results that were available during my care of the patient were reviewed by me and considered in my medical decision making (see chart for details).  MDM Rules/Calculators/A&P Right lower quadrant pain with peritoneal signs worrisome for either appendicitis or recurrence of diverticular abscess.  Old records were reviewed confirming hospitalization for diverticular abscess 12/02-2011/6.  Follow-up evaluation by interventional radiology on 12/14 showed decompressed abscess and no evidence of enteric fistula.  Labs are significant for marked leukocytosis of 21.4.  Urinalysis shows no ketonuria.  She will be given IV fluids, morphine, ondansetron and sent for CT of abdomen and pelvis.  Case is signed out to Dr. Kathrynn Humble, [facility] ED physician.  Anticipating either recurrent diverticular abscess or appendicitis, a dose of Zosyn is ordered.  Final Clinical Impression(s) / ED Diagnoses Final diagnoses:  RLQ abdominal pain  Leukocytosis, unspecified type    Rx / DC Orders ED Discharge Orders    None       Delora Fuel, MD 31/59/45 [phone]

## 2020-12-11 NOTE — ED Notes (Signed)
PT refusing 2nd set of blood cultures.

## 2020-12-11 NOTE — Consult Note (Addendum)
[facility] Surgery Consult Note  Barbara Yates [birth year]  [id number].    Requesting MD: Fuller Plan Chief Complaint: abdominal pain, nausea and vomiting, diarrhea that started around 3 PM yesterday. Reason for Consult:   HPI:  Patient is a 50 year old female who presented 11/06/2020 with right lower quadrant pain, nausea, vomiting, and diarrhea.  Initially thought this was to be a GI bug.  She had recently been treated for PID on 10/28/2020.   We  saw her in consult on 11/07/2020.  She presented with recurrent right lower quadrant discomfort and was seen by Dr. Autumn Messing III.  She had a right lower quadrant abscess whose etiology was uncertain.  Differential, included PID, perforated appendicitis, possible perforated diverticulitis.  She underwent IR drain placement on 11/07/2020 by IR for this 8.2 x 4.6 cm gas and fluid collection.  40 mL of grayish purulent fluid was removed.  It was Dr. Moises Blood opinion that it appeared more diverticular in etiology.  He noted there is extraluminal gas around the sigmoid and there is contrast in the appendix.  Based on Dr. Moises Blood findings, patient was transferred to general surgery.  Patient had previously been treated with doxycycline for her PID.  She was started on Zosyn and IV Flagyl on her 11/07/2020 admission.  She was subsequently discharged home on 11/09/2020 with 12 more days of Augmentin and her IR drain.  She underwent a repeat CT and drain injection on 11/17/2020.  This showed no definitive communication in the bowel.  There was a scant amount of drainage and the drain was removed at that time.  Patient presented yesterday with the above-noted symptoms.    Work-up shows: Patient is afebrile blood pressure is elevated, she notes no real hx of hypertension except when in pain.  She was tachycardic on admission but improved during her hospitalization.  CMP on admission shows a potassium of 3.4, glucose of 137, AST of 14 the remainder of the CMP is  normal.  WBC 21.4, H/H 14.2/43.6, platelets 3 and 22,000.  Repeat CBC this a.m. shows his white count is 17.7.  A CT of the abdomen pelvis obtained this a.m. shows him laboratory mass abuts the margin of the cecum containing a single air bubble there is a small tubular structure that extends above the inflammatory collection.  5 mm in diameter consistent with the appendiceal tip.  The remainder of the appendix appears confluent with the right lower quadrant inflammatory mass measuring 5.5 x 4 x 4.1 cm this area extends from the right aspect of the mid sigmoid colon to the inflammatory mass.  This portion of the sigmoid colon shows mild adjacent inflammatory stranding and evidence of eccentric wall thickening as well as numerous diverticuli.  When compared with prior study the percutaneous drain has been removed.  The remainder of the colon shows additional diverticuli throughout the left but no evidence of inflammation there is mild wall thickening in the distal ileum as it passes over the right lower quadrant inflammatory process.  No other small bowel abnormality we are asked to see.    She was seen in our office on 12/09/2020 by Dr. Nadeen Landau.  That time she had some right lower quadrant discomfort. Nothing to speak of at the time.  Then she started having pain, nausea and vomiting in the afternoon 12/10/20.  Dr. Dema Severin reviewed the studies and noted she had not had a colonoscopy and was planning to arrange colonoscopy with [facility], and then scheduled for repeat visit  on 02/09/2021.  She is being readmitted by the Medicine service with sepsis secondary to acute abdominal phlegmon.  We are asked to see.      ROS: Review of Systems  Constitutional: Positive for chills (last PM). Negative for fever.  HENT: Negative.   Eyes: Negative.   Respiratory: Negative.   Cardiovascular: Negative.   Gastrointestinal: Positive for abdominal pain, diarrhea (he stools have been soft, but improving till  yesterday), nausea and vomiting. Negative for blood in stool and melena.  Genitourinary: Negative.   Musculoskeletal: Positive for back pain.  Skin: Negative.   Neurological: Negative.   Endo/Heme/Allergies: Negative.   Psychiatric/Behavioral: The patient is nervous/anxious.     Family History  Problem Relation Age of Onset  . Hypertension Paternal Grandmother   . CVA Paternal Grandmother   . Alzheimer's disease Paternal Grandmother   . Cancer Paternal Grandfather        Pancreatic  . Pancreatic cancer Maternal Grandfather     Past Medical History:  Diagnosis Date  . Anxiety   . Chronic back pain   . Depression   . Diverticulitis   . Hyperlipidemia   . Insomnia   . Migraine headache   . Myofascial pain     Past Surgical History:  Procedure Laterality Date  . CERVICAL SPINE SURGERY    . IR RADIOLOGIST EVAL & MGMT  11/17/2020  . TUBAL LIGATION      Social History:  reports that she quit smoking about 22 years ago. Her smoking use included cigarettes. She started smoking about 25 years ago. She has a 0.75 pack-year smoking history. She has never used smokeless tobacco. She reports current alcohol use of about 4.0 standard drinks of alcohol per week. She reports that she does not use drugs.  Allergies: No Known Allergies  Prior to Admission medications   Medication Sig Start Date End Date Taking? Authorizing Provider  acetaminophen (TYLENOL) 500 MG tablet Take 1,000 mg by mouth every 6 (six) hours as needed for moderate pain.   Yes [provider]  ALPRAZolam (XANAX) 1 MG tablet TAKE 1 TABLET EVERY DAY AS NEEDED FOR ANXIETY Patient taking differently: Take 1 mg by mouth daily as needed for anxiety. 12/03/20  Yes Isaac Bliss, Rayford Halsted, MD  atorvastatin (LIPITOR) 10 MG tablet Take 1 tablet (10 mg total) by mouth daily. 11/06/20  Yes Isaac Bliss, Rayford Halsted, MD  citalopram (CELEXA) 40 MG tablet TAKE 1 TABLET BY MOUTH EVERY DAY Patient taking differently: Take  40 mg by mouth daily. 12/03/20  Yes Isaac Bliss, Rayford Halsted, MD  cyclobenzaprine (FLEXERIL) 10 MG tablet TAKE 1 TABLET BY MOUTH AT BEDTIME AS NEEDED FOR SPASMS Patient taking differently: Take 10 mg by mouth at bedtime as needed for muscle spasms. 12/02/20  Yes Isaac Bliss, Rayford Halsted, MD  ibuprofen (ADVIL) 200 MG tablet Take 600 mg by mouth every 6 (six) hours as needed (pain).   Yes [provider]  valACYclovir (VALTREX) 1000 MG tablet TAKE 1 TABLET BY MOUTH EVERY DAY Patient taking differently: Take 1,000 mg by mouth daily as needed (breakouts). 07/21/20  Yes Isaac Bliss, Rayford Halsted, MD  zolpidem (AMBIEN) 10 MG tablet TAKE 1 TABLET BY MOUTH AT BEDTIME AS NEEDED FOR SLEEP. Patient taking differently: Take 10 mg by mouth at bedtime as needed for sleep. 12/02/20  Yes Isaac Bliss, Rayford Halsted, MD  ondansetron (ZOFRAN ODT) 4 MG disintegrating tablet Take 1 tablet (4 mg total) by mouth every 8 (eight) hours as needed for  nausea or vomiting. Patient not taking: Reported on 12/11/2020 10/28/20   Barrie Folk, PA-C  oxyCODONE (ROXICODONE) 5 MG immediate release tablet Take 1 tablet (5 mg total) by mouth every 4 (four) hours as needed for moderate pain or severe pain. Patient not taking: Reported on 12/11/2020 11/09/20   Saverio Danker, PA-C     Blood pressure (!) 163/97, pulse 89, temperature 98.2 F (36.8 C), resp. rate 17, SpO2 95 %. Physical Exam:  General: pleasant, WD, AA female who is laying in bed in NAD HEENT: head is normocephalic, atraumatic.  Sclera are noninjected.  Pupils are equal.  Ears and nose without any masses or lesions.  Mouth is pink and moist Heart: regular, rate, and rhythm.  Normal s1,s2. No obvious murmurs, gallops, or rubs noted.  Palpable radial and pedal pulses bilaterally Lungs: CTAB, no wheezes, rhonchi, or rales noted.  Respiratory effort nonlabored Abd: soft, Tender RLQ, minimal tenderness in the remainder of the abdomen., ND, +BS, no masses,  hernias, or organomegaly MS: all 4 extremities are symmetrical with no cyanosis, clubbing, or edema. Skin: warm and dry with no masses, lesions, or rashes Neuro: Cranial nerves 2-12 grossly intact, sensation is normal throughout Psych: A&Ox3 with an appropriate affect.   Results for orders placed or performed during the hospital encounter of 12/10/20 (from the past 48 hour(s))  Lipase, blood     Status: None   Collection Time: 12/10/20  5:13 PM  Result Value Ref Range   Lipase 28 11 - 51 U/L    Comment: Performed at [facility], [place] [address], [place], [place] [address]  Comprehensive metabolic panel     Status: Abnormal   Collection Time: 12/10/20  5:13 PM  Result Value Ref Range   Sodium 136 135 - 145 mmol/L   Potassium 3.4 (L) 3.5 - 5.1 mmol/L   Chloride 100 98 - 111 mmol/L   CO2 22 22 - 32 mmol/L   Glucose, Bld 137 (H) 70 - 99 mg/dL    Comment: Glucose reference range applies only to samples taken after fasting for at least 8 hours.   BUN 5 (L) 6 - 20 mg/dL   Creatinine, Ser 0.99 0.44 - 1.00 mg/dL   Calcium 9.1 8.9 - 10.3 mg/dL   Total Protein 7.6 6.5 - 8.1 g/dL   Albumin 3.9 3.5 - 5.0 g/dL   AST 14 (L) 15 - 41 U/L   ALT 13 0 - 44 U/L   Alkaline Phosphatase 100 38 - 126 U/L   Total Bilirubin 1.2 0.3 - 1.2 mg/dL   GFR, Estimated >60 >60 mL/min    Comment: (NOTE) Calculated using the CKD-EPI Creatinine Equation (2021)    Anion gap 14 5 - 15    Comment: Performed at [facility] [address] [place], [place] [address]  CBC     Status: Abnormal   Collection Time: 12/10/20  5:13 PM  Result Value Ref Range   WBC 21.4 (H) 4.0 - 10.5 K/uL   RBC 4.76 3.87 - 5.11 MIL/uL   Hemoglobin 14.2 12.0 - 15.0 g/dL   HCT 43.6 36.0 - 46.0 %   MCV 91.6 80.0 - 100.0 fL   MCH 29.8 26.0 - 34.0 pg   MCHC 32.6 30.0 - 36.0 g/dL   RDW 14.5 11.5 - 15.5 %   Platelets 322 150 - 400 K/uL   nRBC 0.0 0.0 - 0.2 %    Comment: Performed at [facility], [place]  [address]  [place], [place] [address]  Urinalysis, Routine w reflex microscopic Urine, Clean Catch     Status: None   Collection Time: 12/10/20 10:00 PM  Result Value Ref Range   Color, Urine YELLOW YELLOW   APPearance CLEAR CLEAR   Specific Gravity, Urine 1.016 1.005 - 1.030   pH 8.0 5.0 - 8.0   Glucose, UA NEGATIVE NEGATIVE mg/dL   Hgb urine dipstick NEGATIVE NEGATIVE   Bilirubin Urine NEGATIVE NEGATIVE   Ketones, ur NEGATIVE NEGATIVE mg/dL   Protein, ur NEGATIVE NEGATIVE mg/dL   Nitrite NEGATIVE NEGATIVE   Leukocytes,Ua NEGATIVE NEGATIVE    Comment: Performed at [facility] [address] [place], [place] [address]  Lactic acid, plasma     Status: None   Collection Time: 12/11/20  8:04 AM  Result Value Ref Range   Lactic Acid, Venous 1.1 0.5 - 1.9 mmol/L    Comment: Performed at [facility] [address] [place], [place] [address]  CBC with Differential     Status: Abnormal   Collection Time: 12/11/20  8:04 AM  Result Value Ref Range   WBC 17.7 (H) 4.0 - 10.5 K/uL   RBC 4.78 3.87 - 5.11 MIL/uL   Hemoglobin 14.0 12.0 - 15.0 g/dL   HCT 44.2 36.0 - 46.0 %   MCV 92.5 80.0 - 100.0 fL   MCH 29.3 26.0 - 34.0 pg   MCHC 31.7 30.0 - 36.0 g/dL   RDW 14.7 11.5 - 15.5 %   Platelets 282 150 - 400 K/uL   nRBC 0.0 0.0 - 0.2 %   Neutrophils Relative % 88 %   Neutro Abs 15.6 (H) 1.7 - 7.7 K/uL   Lymphocytes Relative 7 %   Lymphs Abs 1.1 0.7 - 4.0 K/uL   Monocytes Relative 5 %   Monocytes Absolute 0.8 0.1 - 1.0 K/uL   Eosinophils Relative 0 %   Eosinophils Absolute 0.0 0.0 - 0.5 K/uL   Basophils Relative 0 %   Basophils Absolute 0.1 0.0 - 0.1 K/uL   Immature Granulocytes 0 %   Abs Immature Granulocytes 0.05 0.00 - 0.07 K/uL    Comment: Performed at [facility] [address] [place], [place] [address]   CT ABDOMEN PELVIS W CONTRAST  Result Date: 12/11/2020 CLINICAL DATA:  Right lower quadrant abdominal pain. History of diverticulitis with abscess. EXAM: CT ABDOMEN AND  PELVIS WITH CONTRAST TECHNIQUE: Multidetector CT imaging of the abdomen and pelvis was performed using the standard protocol following bolus administration of intravenous contrast. CONTRAST:  131mL OMNIPAQUE IOHEXOL 350 MG/ML SOLN COMPARISON:  11/17/2020, 11/06/2020 and 10/28/2020. FINDINGS: Lower chest: Clear lung bases.  Heart normal in size. Hepatobiliary: Stable 7 mm segment 6 cyst. Liver otherwise unremarkable. Normal gallbladder. No bile duct dilation. Pancreas: Unremarkable. No pancreatic ductal dilatation or surrounding inflammatory changes. Spleen: Normal in size without focal abnormality. Adrenals/Urinary Tract: Adrenal glands are unremarkable. Kidneys are normal, without renal calculi, focal lesion, or hydronephrosis. Bladder is unremarkable. Stomach/Bowel: Heterogeneous inflammatory mass abuts the medial margin of the cecum, containing a single small air bubble. There is a small tubular structure containing air that extends above this inflammatory collection, 5 mm in diameter consistent with the appendiceal tip. The remainder of the appendix appears confluent with the right lower quadrant inflammatory mass. The mass measures approximately 5.5 x 4.0 x 4.1 cm. There is heterogeneous attenuation that extends from the right aspect of the mid sigmoid colon to the inflammatory mass. This portion  of the sigmoid colon shows mild adjacent inflammatory stranding and evidence of eccentric wall thickening as well as numerous diverticula. When compared to the prior exam, the inflammatory mass is increased and the percutaneous drainage catheter has been removed. Remainder of the colon shows additional diverticula throughout the left colon, but no other evidence of inflammation. There is mild wall thickening of the distal ileum as it passes over the right lower quadrant inflammatory process. No other small bowel abnormality. Normal stomach. Vascular/Lymphatic: Scattered subcentimeter retroperitoneal lymph nodes,  stable. Minimal aortic atherosclerosis. Reproductive: Prominent but stable uterus.  No ovarian masses. Other: No free air.  No ascites.  No hernia. Musculoskeletal: No fracture or acute finding. Degenerative changes of the lower lumbar spine most evident at L3-L4. No bone lesions. IMPRESSION: 1. Inflammatory mass in the right lower quadrant abutting the medial aspect of the cecum and contiguous with portions of the appendix. This appears to extend from the right margin of the mid sigmoid colon, consistent with a peridiverticular phlegmon. No defined drainable abscess is visualized, however. The distal appendix is seen above this inflammatory mass, normal in diameter. 2. No other acute or abnormalities. 3. Minor aortic atherosclerosis. Electronically Signed   By: Lajean Manes M.D.   On: 12/11/2020 10:08   . sodium chloride 75 mL/hr at 12/11/20 1339  . piperacillin-tazobactam (ZOSYN)  IV       Assessment/Plan Hyperlipidemia Chronic back pain Depression Anxiety Hx PID   Hx right lower quadrant 8.2 x  4.6 cm gas fluid collection; probably diverticular 11/07/20 New right lower quadrant inflammatory mass - possible recurrent sigmoid diverticulitis  FEN:  IV fluids/NPO ID:  Zosyn 1/7>> day 1 DVT:  She can have DVT prophylaxis from our standpoint/SCD's added Follow up:  Dr. Dema Severin  Plan:  Agree with bowel rest, IV hydration, IV antibiotics.  I have talked with IR and DR. Shick has reviewed the films. There is currently nothing to drain.  He recommended repeat CT in about 72 hrs.  She will need to get over this and then a colonoscopy, before decision for partial colectomy can be made.  We will follow with you.   Earnstine Regal [facility] Surgery 12/11/2020, 12:38 PM Please see Amion for pager number during day hours 7:00am-4:30pm

## 2020-12-12 DIAGNOSIS — K5732 Diverticulitis of large intestine without perforation or abscess without bleeding: Secondary | ICD-10-CM

## 2020-12-12 LAB — CBC
HCT: 37.2 % (ref 36.0–46.0)
Hemoglobin: 11.7 g/dL — ABNORMAL LOW (ref 12.0–15.0)
MCH: 29.3 pg (ref 26.0–34.0)
MCHC: 31.5 g/dL (ref 30.0–36.0)
MCV: 93 fL (ref 80.0–100.0)
Platelets: 251 10*3/uL (ref 150–400)
RBC: 4 MIL/uL (ref 3.87–5.11)
RDW: 14.9 % (ref 11.5–15.5)
WBC: 17.5 10*3/uL — ABNORMAL HIGH (ref 4.0–10.5)
nRBC: 0 % (ref 0.0–0.2)

## 2020-12-12 LAB — BASIC METABOLIC PANEL
Anion gap: 10 (ref 5–15)
BUN: 6 mg/dL (ref 6–20)
CO2: 23 mmol/L (ref 22–32)
Calcium: 8.2 mg/dL — ABNORMAL LOW (ref 8.9–10.3)
Chloride: 102 mmol/L (ref 98–111)
Creatinine, Ser: 1.15 mg/dL — ABNORMAL HIGH (ref 0.44–1.00)
GFR, Estimated: 59 mL/min — ABNORMAL LOW (ref >60)
Glucose, Bld: 104 mg/dL — ABNORMAL HIGH (ref 70–99)
Potassium: 4.2 mmol/L (ref 3.5–5.1)
Sodium: 135 mmol/L (ref 135–145)

## 2020-12-12 MED ORDER — HYDRALAZINE HCL 20 MG/ML IJ SOLN
10.0000 mg | INTRAMUSCULAR | Status: DC | PRN
  Filled 2020-12-12: qty 1

## 2020-12-12 NOTE — Progress Notes (Signed)
PROGRESS NOTE    Barbara Yates  EXH:[id number] DOB: 12-17-1970 DOA: 12/10/2020 PCP: Isaac Bliss, Rayford Halsted, MD   Brief Narrative:  HPI: Barbara Yates is a 50 y.o. female with medical history significant of hyperlipidemia, diverticulitis, chronic back pain, depression, and anxiety presents with complaints of vomiting, diarrhea, and stomach pain.  Symptoms initially started 2 days ago with a mild pain in the right lower quadrant of her abdomen.  However, yesterday symptoms worsened to the point where she developed nausea vomiting, and diarrhea.  Emesis and diarrhea were both nonbloody in appearance.  Abdominal pain became severe pain in the right lower quadrant of her abdomen.  She tried taking Tylenol at home, but was unable to keep it down.  Symptoms similar to when patient was admitted into the hospital last month for perforated colonic diverticulitis with 8 cm abscess in the right lower quadrant vs. tubo-ovarian abscess.  She is treated with IV antibiotics and IR placed a drain.   Patient had followed up with Dr. Nadeen Landau of surgery in the outpatient setting and reports that the plan was to have resection of that part of the colon sometime in March or April.  This was in order to allow the patient to have a colonoscopy.  ED Course: Upon admission into the emergency department patient was seen to be afebrile, heart rate 72-1 31, respirations 16-22, blood pressures elevated up to 163/97, and O2 saturations maintained.  Labs from 1/6 significant for WBC 21.4, potassium 3.4, lipase 28, and lactic acid 1.1.  Urinalysis was negative for any acute abnormalities.  CT scan of the abdomen and pelvis with contrast revealed inflammatory mass in the right lower quadrant consistent with peridiverticular phlegmon with normal-appearing appendix.  Patient was given 1 L of lactated Ringers, Zosyn, antiemetics, and pain medication IV.  TR[facility] called to admit.  Assessment & Plan:   Principal Problem:    Diverticulitis of colon Active Problems:   Anxiety   Hyperlipemia   Sepsis ([place])   Phlegmon   Hypokalemia   Sepsis secondary to phlegmon: Acute.  On admission patient was noted to be tachycardic and tachypneic with WBC elevated up to 21.4. Lactic acid was 1.1 after initial therapies were started.  CT abdomen indicative of possible sigmoid diverticulitis.  Patient is still in significant pain.  General surgery on board.  Plan for continuing IV Zosyn for 2 days and repeating CT scan on Monday.  She has been started on clears by general surgery.  Management per general surgery and appreciate their help.    Nausea, vomiting, and diarrhea: Suspect secondary to above. -Symptomatic treatment as needed.  Elevated blood pressure: No history of hypertension but blood pressure elevated, mostly diastolic.  Could very well be due to pain.  She is on hydralazine but parameters are very high.  Will reduce parameters to 381 systolic and 017 diastolic.  Hypokalemia: Resolved.  Hyperlipidemia: Continue statin.  Anxiety and depression: Continue home medications.  DVT prophylaxis: Place and maintain sequential compression device Start: 12/11/20 1406 enoxaparin (LOVENOX) injection 40 mg Start: 12/11/20 1115   Code Status: Full Code  Family Communication: None present at bedside.  Plan of care discussed with patient in length and he verbalized understanding and agreed with it.  Status is: Inpatient  Remains inpatient appropriate because:Inpatient level of care appropriate due to severity of illness   Dispo: The patient is from: Home              Anticipated d/c is to:  Home              Anticipated d/c date is: 2-3 day              Patient currently is not medically stable to d/c.        Estimated body mass index is 33.09 kg/m as calculated from the following:   Height as of 11/06/20: 5\' 9"  (1.753 m).   Weight as of 11/06/20: 101.6 kg.      Nutritional status:                Consultants:   General surgery  Procedures:   None  Antimicrobials:  Anti-infectives (From admission, onward)   Start     Dose/Rate Route Frequency Ordered Stop   12/11/20 1400  piperacillin-tazobactam (ZOSYN) IVPB 3.375 g        3.375 g 12.5 mL/hr over 240 Minutes Intravenous Every 8 hours 12/11/20 1241     12/11/20 1300  piperacillin-tazobactam (ZOSYN) IVPB 3.375 g  Status:  Discontinued        3.375 g 100 mL/hr over 30 Minutes Intravenous Every 6 hours 12/11/20 1223 12/11/20 1241   12/11/20 0700  piperacillin-tazobactam (ZOSYN) IVPB 3.375 g        3.375 g 100 mL/hr over 30 Minutes Intravenous  Once 12/11/20 0651 12/11/20 0829         Subjective: Patient seen and examined.  Still with abdominal pain mostly located in the right side.  Some nausea but no vomiting.  No other complaint.  No diarrhea.  Objective: Vitals:   12/11/20 1458 12/11/20 1554 12/11/20 2030 12/12/20 0336  BP: (!) 169/92 (!) 167/93 (!) 149/94 (!) 141/95  Pulse: (!) 104 100 96 97  Resp: (!) 24 20 20 18   Temp: 100 F (37.8 C) 99.9 F (37.7 C) 100.3 F (37.9 C) 99.1 F (37.3 C)  TempSrc: Oral Oral Oral Oral  SpO2: 93% 95% 99% 96%    Intake/Output Summary (Last 24 hours) at 12/12/2020 1320 Last data filed at 12/12/2020 0651 Gross per 24 hour  Intake 1304.72 ml  Output --  Net 1304.72 ml   There were no vitals filed for this visit.  Examination:  General exam: Appears in pain but is still pleasant Respiratory system: Clear to auscultation. Respiratory effort normal. Cardiovascular system: S1 & S2 heard, RRR. No JVD, murmurs, rubs, gallops or clicks. No pedal edema. Gastrointestinal system: Abdomen is nondistended, soft and tender at the right upper quadrant right middle and right lower quadrant. No organomegaly or masses felt. Normal bowel sounds heard. Central nervous system: Alert and oriented. No focal neurological deficits. Extremities: Symmetric 5 x 5 power. Skin: No rashes, lesions  or ulcers Psychiatry: Judgement and insight appear normal. Mood & affect appropriate.    Data Reviewed: I have personally reviewed following labs and imaging studies  CBC: Recent Labs  Lab 12/10/20 1713 12/11/20 0804 12/12/20 0121  WBC 21.4* 17.7* 17.5*  NEUTROABS  --  15.6*  --   HGB 14.2 14.0 11.7*  HCT 43.6 44.2 37.2  MCV 91.6 92.5 93.0  PLT 322 282 [id number]   Basic Metabolic Panel: Recent Labs  Lab 12/10/20 1713 12/12/20 0121  NA 136 135  K 3.4* 4.2  CL 100 102  CO2 22 23  GLUCOSE 137* 104*  BUN 5* 6  CREATININE 0.99 1.15*  CALCIUM 9.1 8.2*   GFR: CrCl cannot be calculated (Unknown ideal weight.). Liver Function Tests: Recent Labs  Lab 12/10/20 1713  AST 14*  ALT 13  ALKPHOS 100  BILITOT 1.2  PROT 7.6  ALBUMIN 3.9   Recent Labs  Lab 12/10/20 1713  LIPASE 28   No results for input(s): AMMONIA in the last 168 hours. Coagulation Profile: No results for input(s): INR, PROTIME in the last 168 hours. Cardiac Enzymes: No results for input(s): CKTOTAL, CKMB, CKMBINDEX, TROPONINI in the last 168 hours. BNP (last 3 results) No results for input(s): PROBNP in the last 8760 hours. HbA1C: No results for input(s): HGBA1C in the last 72 hours. CBG: No results for input(s): GLUCAP in the last 168 hours. Lipid Profile: No results for input(s): CHOL, HDL, LDLCALC, TRIG, CHOLHDL, LDLDIRECT in the last 72 hours. Thyroid Function Tests: No results for input(s): TSH, T4TOTAL, FREET4, T3FREE, THYROIDAB in the last 72 hours. Anemia Panel: No results for input(s): VITAMINB12, FOLATE, FERRITIN, TIBC, IRON, RETICCTPCT in the last 72 hours. Sepsis Labs: Recent Labs  Lab 12/11/20 0804  LATICACIDVEN 1.1    Recent Results (from the past 240 hour(s))  SARS CORONAVIRUS 2 (TAT 6-24 HRS) Nasopharyngeal Nasopharyngeal Swab     Status: None   Collection Time: 12/11/20 10:52 AM   Specimen: Nasopharyngeal Swab  Result Value Ref Range Status   SARS Coronavirus 2 NEGATIVE  NEGATIVE Final    Comment: (NOTE) SARS-CoV-2 target nucleic acids are NOT DETECTED.  The SARS-CoV-2 RNA is generally detectable in upper and lower respiratory specimens during the acute phase of infection. Negative results do not preclude SARS-CoV-2 infection, do not rule out co-infections with other pathogens, and should not be used as the sole basis for treatment or other patient management decisions. Negative results must be combined with clinical observations, patient history, and epidemiological information. The expected result is Negative.  Fact Sheet for Patients: SugarRoll.be  Fact Sheet for Healthcare Providers: https://www.woods-mathews.com/  This test is not yet approved or cleared by the [place] FDA and  has been authorized for detection and/or diagnosis of SARS-CoV-2 by FDA under an Emergency Use Authorization (EUA). This EUA will remain  in effect (meaning this test can be used) for the duration of the COVID-19 declaration under Se ction 564(b)(1) of the Act, 21 U.S.C. section 360bbb-3(b)(1), unless the authorization is terminated or revoked sooner.  Performed at [facility], [place] [address] [place], [place] [address]   Culture, blood (routine x 2)     Status: None (Preliminary result)   Collection Time: 12/11/20  2:21 PM   Specimen: BLOOD  Result Value Ref Range Status   Specimen Description BLOOD SITE NOT SPECIFIED  Final   Special Requests   Final    BOTTLES DRAWN AEROBIC AND ANAEROBIC Blood Culture adequate volume   Culture   Final    NO GROWTH < 24 HOURS Performed at [facility], [place] [address], [place], [place] [address]    Report Status PENDING  Incomplete      Radiology Studies: CT ABDOMEN PELVIS W CONTRAST  Result Date: 12/11/2020 CLINICAL DATA:  Right lower quadrant abdominal pain. History of diverticulitis with abscess. EXAM: CT ABDOMEN AND PELVIS WITH CONTRAST TECHNIQUE: Multidetector  CT imaging of the abdomen and pelvis was performed using the standard protocol following bolus administration of intravenous contrast. CONTRAST:  157mL OMNIPAQUE IOHEXOL 350 MG/ML SOLN COMPARISON:  11/17/2020, 11/06/2020 and 10/28/2020. FINDINGS: Lower chest: Clear lung bases.  Heart normal in size. Hepatobiliary: Stable 7 mm segment 6 cyst. Liver otherwise unremarkable. Normal gallbladder. No bile duct dilation. Pancreas: Unremarkable. No pancreatic ductal dilatation or surrounding inflammatory  changes. Spleen: Normal in size without focal abnormality. Adrenals/Urinary Tract: Adrenal glands are unremarkable. Kidneys are normal, without renal calculi, focal lesion, or hydronephrosis. Bladder is unremarkable. Stomach/Bowel: Heterogeneous inflammatory mass abuts the medial margin of the cecum, containing a single small air bubble. There is a small tubular structure containing air that extends above this inflammatory collection, 5 mm in diameter consistent with the appendiceal tip. The remainder of the appendix appears confluent with the right lower quadrant inflammatory mass. The mass measures approximately 5.5 x 4.0 x 4.1 cm. There is heterogeneous attenuation that extends from the right aspect of the mid sigmoid colon to the inflammatory mass. This portion of the sigmoid colon shows mild adjacent inflammatory stranding and evidence of eccentric wall thickening as well as numerous diverticula. When compared to the prior exam, the inflammatory mass is increased and the percutaneous drainage catheter has been removed. Remainder of the colon shows additional diverticula throughout the left colon, but no other evidence of inflammation. There is mild wall thickening of the distal ileum as it passes over the right lower quadrant inflammatory process. No other small bowel abnormality. Normal stomach. Vascular/Lymphatic: Scattered subcentimeter retroperitoneal lymph nodes, stable. Minimal aortic atherosclerosis.  Reproductive: Prominent but stable uterus.  No ovarian masses. Other: No free air.  No ascites.  No hernia. Musculoskeletal: No fracture or acute finding. Degenerative changes of the lower lumbar spine most evident at L3-L4. No bone lesions. IMPRESSION: 1. Inflammatory mass in the right lower quadrant abutting the medial aspect of the cecum and contiguous with portions of the appendix. This appears to extend from the right margin of the mid sigmoid colon, consistent with a peridiverticular phlegmon. No defined drainable abscess is visualized, however. The distal appendix is seen above this inflammatory mass, normal in diameter. 2. No other acute or abnormalities. 3. Minor aortic atherosclerosis. Electronically Signed   By: Lajean Manes M.D.   On: 12/11/2020 10:08    Scheduled Meds: . atorvastatin  10 mg Oral Daily  . citalopram  40 mg Oral Daily  . enoxaparin (LOVENOX) injection  40 mg Subcutaneous Daily  . sodium chloride flush  3 mL Intravenous Q12H   Continuous Infusions: . sodium chloride 75 mL/hr at 12/12/20 0651  . piperacillin-tazobactam (ZOSYN)  IV 3.375 g (12/12/20 1313)     LOS: 1 day   Time spent: 35 minutes   Darliss Cheney, MD [facility]  12/12/2020, 1:20 PM   To contact the attending provider between 7A-7P or the covering provider during after hours 7P-7A, please log into the web site [email or web address].

## 2020-12-12 NOTE — Progress Notes (Signed)
Diverticulitis of colon  Subjective: Pt with some improvement in pain, having bowel movements  Objective: Vital signs in last 24 hours: Temp:  [99.1 F (37.3 C)-100.3 F (37.9 C)] 99.1 F (37.3 C) (01/08 0336) Pulse Rate:  [81-104] 97 (01/08 0336) Resp:  [17-24] 18 (01/08 0336) BP: (141-169)/(92-105) 141/95 (01/08 0336) SpO2:  [93 %-99 %] 96 % (01/08 0336) Last BM Date: 12/10/20  Intake/Output from previous day: 01/07 0701 - 01/08 0700 In: 2363.5 [P.O.:120; I.V.:1071.7; IV Piggyback:1171.8] Out: -  Intake/Output this shift: No intake/output data recorded.  General appearance: alert and cooperative GI: normal findings: soft, TTP RLQ  Lab Results:  Results for orders placed or performed during the hospital encounter of 12/10/20 (from the past 24 hour(s))  Lactic acid, plasma     Status: None   Collection Time: 12/11/20  8:04 AM  Result Value Ref Range   Lactic Acid, Venous 1.1 0.5 - 1.9 mmol/L  CBC with Differential     Status: Abnormal   Collection Time: 12/11/20  8:04 AM  Result Value Ref Range   WBC 17.7 (H) 4.0 - 10.5 K/uL   RBC 4.78 3.87 - 5.11 MIL/uL   Hemoglobin 14.0 12.0 - 15.0 g/dL   HCT 44.2 36.0 - 46.0 %   MCV 92.5 80.0 - 100.0 fL   MCH 29.3 26.0 - 34.0 pg   MCHC 31.7 30.0 - 36.0 g/dL   RDW 14.7 11.5 - 15.5 %   Platelets 282 150 - 400 K/uL   nRBC 0.0 0.0 - 0.2 %   Neutrophils Relative % 88 %   Neutro Abs 15.6 (H) 1.7 - 7.7 K/uL   Lymphocytes Relative 7 %   Lymphs Abs 1.1 0.7 - 4.0 K/uL   Monocytes Relative 5 %   Monocytes Absolute 0.8 0.1 - 1.0 K/uL   Eosinophils Relative 0 %   Eosinophils Absolute 0.0 0.0 - 0.5 K/uL   Basophils Relative 0 %   Basophils Absolute 0.1 0.0 - 0.1 K/uL   Immature Granulocytes 0 %   Abs Immature Granulocytes 0.05 0.00 - 0.07 K/uL  SARS CORONAVIRUS 2 (TAT 6-24 HRS) Nasopharyngeal Nasopharyngeal Swab     Status: None   Collection Time: 12/11/20 10:52 AM   Specimen: Nasopharyngeal Swab  Result Value Ref Range   SARS  Coronavirus 2 NEGATIVE NEGATIVE  CBC     Status: Abnormal   Collection Time: 12/12/20  1:21 AM  Result Value Ref Range   WBC 17.5 (H) 4.0 - 10.5 K/uL   RBC 4.00 3.87 - 5.11 MIL/uL   Hemoglobin 11.7 (L) 12.0 - 15.0 g/dL   HCT 37.2 36.0 - 46.0 %   MCV 93.0 80.0 - 100.0 fL   MCH 29.3 26.0 - 34.0 pg   MCHC 31.5 30.0 - 36.0 g/dL   RDW 14.9 11.5 - 15.5 %   Platelets 251 150 - 400 K/uL   nRBC 0.0 0.0 - 0.2 %  Basic metabolic panel     Status: Abnormal   Collection Time: 12/12/20  1:21 AM  Result Value Ref Range   Sodium 135 135 - 145 mmol/L   Potassium 4.2 3.5 - 5.1 mmol/L   Chloride 102 98 - 111 mmol/L   CO2 23 22 - 32 mmol/L   Glucose, Bld 104 (H) 70 - 99 mg/dL   BUN 6 6 - 20 mg/dL   Creatinine, Ser 1.15 (H) 0.44 - 1.00 mg/dL   Calcium 8.2 (L) 8.9 - 10.3 mg/dL   GFR, Estimated 59 (L) >60  mL/min   Anion gap 10 5 - 15     Studies/Results Radiology     MEDS, Scheduled . atorvastatin  10 mg Oral Daily  . citalopram  40 mg Oral Daily  . enoxaparin (LOVENOX) injection  40 mg Subcutaneous Daily  . sodium chloride flush  3 mL Intravenous Q12H     Assessment: Diverticulitis of colon   Plan: Will start some clears today Cont IVF's Cont Zosyn Recheck CT scan on Mon  LOS: 1 day    Barbara Yates, [facility], [place]    12/12/2020 7:59 AM

## 2020-12-13 LAB — CBC
HCT: 32.6 % — ABNORMAL LOW (ref 36.0–46.0)
Hemoglobin: 10.8 g/dL — ABNORMAL LOW (ref 12.0–15.0)
MCH: 30.4 pg (ref 26.0–34.0)
MCHC: 33.1 g/dL (ref 30.0–36.0)
MCV: 91.8 fL (ref 80.0–100.0)
Platelets: 242 10*3/uL (ref 150–400)
RBC: 3.55 MIL/uL — ABNORMAL LOW (ref 3.87–5.11)
RDW: 14.6 % (ref 11.5–15.5)
WBC: 14.4 10*3/uL — ABNORMAL HIGH (ref 4.0–10.5)
nRBC: 0 % (ref 0.0–0.2)

## 2020-12-13 LAB — BASIC METABOLIC PANEL
Anion gap: 10 (ref 5–15)
BUN: 5 mg/dL — ABNORMAL LOW (ref 6–20)
CO2: 22 mmol/L (ref 22–32)
Calcium: 7.9 mg/dL — ABNORMAL LOW (ref 8.9–10.3)
Chloride: 102 mmol/L (ref 98–111)
Creatinine, Ser: 1.03 mg/dL — ABNORMAL HIGH (ref 0.44–1.00)
GFR, Estimated: >60 mL/min (ref >60)
Glucose, Bld: 84 mg/dL (ref 70–99)
Potassium: 3.3 mmol/L — ABNORMAL LOW (ref 3.5–5.1)
Sodium: 134 mmol/L — ABNORMAL LOW (ref 135–145)

## 2020-12-13 LAB — MAGNESIUM: Magnesium: 1.9 mg/dL (ref 1.7–2.4)

## 2020-12-13 MED ORDER — POTASSIUM CHLORIDE CRYS ER 20 MEQ PO TBCR
40.0000 meq | EXTENDED_RELEASE_TABLET | Freq: Once | ORAL | Status: AC
  Administered 2020-12-13: 40 meq via ORAL
  Filled 2020-12-13: qty 2

## 2020-12-13 NOTE — Progress Notes (Signed)
PROGRESS NOTE    Barbara Yates  [id number] DOB: 1971-03-03 DOA: 12/10/2020 PCP: Isaac Bliss, Rayford Halsted, MD   Brief Narrative:  HPI: Barbara Yates is a 50 y.o. female with medical history significant of hyperlipidemia, diverticulitis, chronic back pain, depression, and anxiety presents with complaints of vomiting, diarrhea, and stomach pain.  Symptoms initially started 2 days ago with a mild pain in the right lower quadrant of her abdomen.  However, yesterday symptoms worsened to the point where she developed nausea vomiting, and diarrhea.  Emesis and diarrhea were both nonbloody in appearance.  Abdominal pain became severe pain in the right lower quadrant of her abdomen.  She tried taking Tylenol at home, but was unable to keep it down.  Symptoms similar to when patient was admitted into the hospital last month for perforated colonic diverticulitis with 8 cm abscess in the right lower quadrant vs. tubo-ovarian abscess.  She is treated with IV antibiotics and IR placed a drain.   Patient had followed up with Dr. Nadeen Landau of surgery in the outpatient setting and reports that the plan was to have resection of that part of the colon sometime in March or April.  This was in order to allow the patient to have a colonoscopy.  ED Course: Upon admission into the emergency department patient was seen to be afebrile, heart rate 72-1 31, respirations 16-22, blood pressures elevated up to 163/97, and O2 saturations maintained.  Labs from 1/6 significant for WBC 21.4, potassium 3.4, lipase 28, and lactic acid 1.1.  Urinalysis was negative for any acute abnormalities.  CT scan of the abdomen and pelvis with contrast revealed inflammatory mass in the right lower quadrant consistent with peridiverticular phlegmon with normal-appearing appendix.  Patient was given 1 L of lactated Ringers, Zosyn, antiemetics, and pain medication IV.  TRH called to admit.  Assessment & Plan:   Principal Problem:    Diverticulitis of colon Active Problems:   Anxiety   Hyperlipemia   Sepsis ([place])   Phlegmon   Hypokalemia   Sepsis secondary to phlegmon: Acute.  On admission patient was noted to be tachycardic and tachypneic with WBC elevated up to 21.4. Lactic acid was 1.1 after initial therapies were started.  CT abdomen indicative of possible sigmoid diverticulitis.  Patient is still in significant pain.  General surgery on board.  Pain and tenderness has improved a little bit.  Plan for continuing IV Zosyn for 2 days and repeating CT scan on Monday.  She has been started on clears by general surgery.  Management per general surgery and appreciate their help.    Nausea, vomiting, and diarrhea: Suspect secondary to above. -Symptomatic treatment as needed.  Elevated blood pressure: No history of hypertension but blood pressure elevated, mostly diastolic.  Could very well be due to pain.  She is on hydralazine but parameters are very high.  Will reduce parameters to 0000000 systolic and [id number] diastolic.  Hypokalemia: We will replace.  Recheck in the morning.  Hyperlipidemia: Continue statin.  Anxiety and depression: Continue home medications.  DVT prophylaxis: Place and maintain sequential compression device Start: 12/11/20 1406 enoxaparin (LOVENOX) injection 40 mg Start: 12/11/20 1115   Code Status: Full Code  Family Communication: None present at bedside.  Plan of care discussed with patient in length and he verbalized understanding and agreed with it.  Status is: Inpatient  Remains inpatient appropriate because:Inpatient level of care appropriate due to severity of illness   Dispo: The patient is from: Home  Anticipated d/c is to: Home              Anticipated d/c date is: 2-3 day              Patient currently is not medically stable to d/c.        Estimated body mass index is 33.09 kg/m as calculated from the following:   Height as of 11/06/20: 5\' 9"  (1.753 m).   Weight  as of 11/06/20: 101.6 kg.      Nutritional status:               Consultants:   General surgery  Procedures:   None  Antimicrobials:  Anti-infectives (From admission, onward)   Start     Dose/Rate Route Frequency Ordered Stop   12/11/20 1400  piperacillin-tazobactam (ZOSYN) IVPB 3.375 g        3.375 g 12.5 mL/hr over 240 Minutes Intravenous Every 8 hours 12/11/20 1241     12/11/20 1300  piperacillin-tazobactam (ZOSYN) IVPB 3.375 g  Status:  Discontinued        3.375 g 100 mL/hr over 30 Minutes Intravenous Every 6 hours 12/11/20 1223 12/11/20 1241   12/11/20 0700  piperacillin-tazobactam (ZOSYN) IVPB 3.375 g        3.375 g 100 mL/hr over 30 Minutes Intravenous  Once 12/11/20 0651 12/11/20 0829         Subjective: Seen and examined.  Pain is slightly better but not much.  Passing flatus.  Some nausea intermittently but no vomiting.  Objective: Vitals:   12/11/20 2030 12/12/20 0336 12/12/20 1442 12/13/20 0544  BP: (!) 149/94 (!) 141/95 134/86 131/82  Pulse: 96 97 88 80  Resp: 20 18 18 16   Temp: 100.3 F (37.9 C) 99.1 F (37.3 C) 98.2 F (36.8 C) 98.4 F (36.9 C)  TempSrc: Oral Oral Oral Oral  SpO2: 99% 96% 100% 98%    Intake/Output Summary (Last 24 hours) at 12/13/2020 1245 Last data filed at 12/13/2020 0308 Gross per 24 hour  Intake 1794.31 ml  Output --  Net 1794.31 ml   There were no vitals filed for this visit.  Examination:  General exam: Appears calm and comfortable  Respiratory system: Clear to auscultation. Respiratory effort normal. Cardiovascular system: S1 & S2 heard, RRR. No JVD, murmurs, rubs, gallops or clicks. No pedal edema. Gastrointestinal system: Abdomen is nondistended, soft and tender mainly at right lower quadrant. No organomegaly or masses felt. Normal bowel sounds heard. Central nervous system: Alert and oriented. No focal neurological deficits. Extremities: Symmetric 5 x 5 power. Skin: No rashes, lesions or ulcers.   Psychiatry: Judgement and insight appear normal. Mood & affect appropriate.   Data Reviewed: I have personally reviewed following labs and imaging studies  CBC: Recent Labs  Lab 12/10/20 1713 12/11/20 0804 12/12/20 0121 12/13/20 0108  WBC 21.4* 17.7* 17.5* 14.4*  NEUTROABS  --  15.6*  --   --   HGB 14.2 14.0 11.7* 10.8*  HCT 43.6 44.2 37.2 32.6*  MCV 91.6 92.5 93.0 91.8  PLT 322 282 251 093   Basic Metabolic Panel: Recent Labs  Lab 12/10/20 1713 12/12/20 0121 12/13/20 0108  NA 136 135 134*  K 3.4* 4.2 3.3*  CL 100 102 102  CO2 22 23 22   GLUCOSE 137* 104* 84  BUN 5* 6 5*  CREATININE 0.99 1.15* 1.03*  CALCIUM 9.1 8.2* 7.9*  MG  --   --  1.9   GFR: CrCl cannot be calculated (Unknown ideal  weight.). Liver Function Tests: Recent Labs  Lab 12/10/20 1713  AST 14*  ALT 13  ALKPHOS 100  BILITOT 1.2  PROT 7.6  ALBUMIN 3.9   Recent Labs  Lab 12/10/20 1713  LIPASE 28   No results for input(s): AMMONIA in the last 168 hours. Coagulation Profile: No results for input(s): INR, PROTIME in the last 168 hours. Cardiac Enzymes: No results for input(s): CKTOTAL, CKMB, CKMBINDEX, TROPONINI in the last 168 hours. BNP (last 3 results) No results for input(s): PROBNP in the last 8760 hours. HbA1C: No results for input(s): HGBA1C in the last 72 hours. CBG: No results for input(s): GLUCAP in the last 168 hours. Lipid Profile: No results for input(s): CHOL, HDL, LDLCALC, TRIG, CHOLHDL, LDLDIRECT in the last 72 hours. Thyroid Function Tests: No results for input(s): TSH, T4TOTAL, FREET4, T3FREE, THYROIDAB in the last 72 hours. Anemia Panel: No results for input(s): VITAMINB12, FOLATE, FERRITIN, TIBC, IRON, RETICCTPCT in the last 72 hours. Sepsis Labs: Recent Labs  Lab 12/11/20 0804  LATICACIDVEN 1.1    Recent Results (from the past 240 hour(s))  SARS CORONAVIRUS 2 (TAT 6-24 HRS) Nasopharyngeal Nasopharyngeal Swab     Status: None   Collection Time: 12/11/20 10:52  AM   Specimen: Nasopharyngeal Swab  Result Value Ref Range Status   SARS Coronavirus 2 NEGATIVE NEGATIVE Final    Comment: (NOTE) SARS-CoV-2 target nucleic acids are NOT DETECTED.  The SARS-CoV-2 RNA is generally detectable in upper and lower respiratory specimens during the acute phase of infection. Negative results do not preclude SARS-CoV-2 infection, do not rule out co-infections with other pathogens, and should not be used as the sole basis for treatment or other patient management decisions. Negative results must be combined with clinical observations, patient history, and epidemiological information. The expected result is Negative.  Fact Sheet for Patients: SugarRoll.be  Fact Sheet for Healthcare Providers: https://www.woods-mathews.com/  This test is not yet approved or cleared by the [place] FDA and  has been authorized for detection and/or diagnosis of SARS-CoV-2 by FDA under an Emergency Use Authorization (EUA). This EUA will remain  in effect (meaning this test can be used) for the duration of the COVID-19 declaration under Se ction 564(b)(1) of the Act, 21 U.S.C. section 360bbb-3(b)(1), unless the authorization is terminated or revoked sooner.  Performed at [facility], [place] [address], [place], [place] [address]   Culture, blood (routine x 2)     Status: None (Preliminary result)   Collection Time: 12/11/20  2:21 PM   Specimen: BLOOD  Result Value Ref Range Status   Specimen Description BLOOD SITE NOT SPECIFIED  Final   Special Requests   Final    BOTTLES DRAWN AEROBIC AND ANAEROBIC Blood Culture adequate volume   Culture   Final    NO GROWTH 1 DAY Performed at [facility], [place] [address] [place], [place] [address]    Report Status PENDING  Incomplete      Radiology Studies: No results found.  Scheduled Meds: . atorvastatin  10 mg Oral Daily  . citalopram  40 mg Oral Daily  . enoxaparin  (LOVENOX) injection  40 mg Subcutaneous Daily  . sodium chloride flush  3 mL Intravenous Q12H   Continuous Infusions: . sodium chloride 75 mL/hr at 12/13/20 0805  . piperacillin-tazobactam (ZOSYN)  IV 3.375 g (12/13/20 0545)     LOS: 2 days   Time spent: 30 minutes   Darliss Cheney, MD [facility]  12/13/2020, 12:45 PM  To contact the attending provider between 7A-7P or the covering provider during after hours 7P-7A, please log into the web site [email or web address].

## 2020-12-13 NOTE — Progress Notes (Signed)
Diverticulitis of colon  Subjective: Pt with some improvement in pain, having loose bowel movements  Objective: Vital signs in last 24 hours: Temp:  [98.2 F (36.8 C)-98.4 F (36.9 C)] 98.4 F (36.9 C) (01/09 0544) Pulse Rate:  [80-88] 80 (01/09 0544) Resp:  [16-18] 16 (01/09 0544) BP: (131-134)/(82-86) 131/82 (01/09 0544) SpO2:  [98 %-100 %] 98 % (01/09 0544) Last BM Date: 12/12/20  Intake/Output from previous day: 01/08 0701 - 01/09 0700 In: 1794.3 [P.O.:150; I.V.:1505.3; IV Piggyback:139.1] Out: -  Intake/Output this shift: No intake/output data recorded.  General appearance: alert and cooperative GI: normal findings: soft, TTP RLQ  Lab Results:  Results for orders placed or performed during the hospital encounter of 12/10/20 (from the past 24 hour(s))  CBC     Status: Abnormal   Collection Time: 12/13/20  1:08 AM  Result Value Ref Range   WBC 14.4 (H) 4.0 - 10.5 K/uL   RBC 3.55 (L) 3.87 - 5.11 MIL/uL   Hemoglobin 10.8 (L) 12.0 - 15.0 g/dL   HCT 32.6 (L) 36.0 - 46.0 %   MCV 91.8 80.0 - 100.0 fL   MCH 30.4 26.0 - 34.0 pg   MCHC 33.1 30.0 - 36.0 g/dL   RDW 14.6 11.5 - 15.5 %   Platelets 242 150 - 400 K/uL   nRBC 0.0 0.0 - 0.2 %  Basic metabolic panel     Status: Abnormal   Collection Time: 12/13/20  1:08 AM  Result Value Ref Range   Sodium 134 (L) 135 - 145 mmol/L   Potassium 3.3 (L) 3.5 - 5.1 mmol/L   Chloride 102 98 - 111 mmol/L   CO2 22 22 - 32 mmol/L   Glucose, Bld 84 70 - 99 mg/dL   BUN 5 (L) 6 - 20 mg/dL   Creatinine, Ser 1.03 (H) 0.44 - 1.00 mg/dL   Calcium 7.9 (L) 8.9 - 10.3 mg/dL   GFR, Estimated >60 >60 mL/min   Anion gap 10 5 - 15  Magnesium     Status: None   Collection Time: 12/13/20  1:08 AM  Result Value Ref Range   Magnesium 1.9 1.7 - 2.4 mg/dL     Studies/Results Radiology     MEDS, Scheduled . atorvastatin  10 mg Oral Daily  . citalopram  40 mg Oral Daily  . enoxaparin (LOVENOX) injection  40 mg Subcutaneous Daily  . potassium  chloride  40 mEq Oral Once  . sodium chloride flush  3 mL Intravenous Q12H     Assessment: Diverticulitis of colon   Plan: Cont clears today Cont IVF's Cont Zosyn Recheck CT scan on Mon  LOS: 2 days    Rosario Adie, MD [facility], [place]    12/13/2020 7:45 AM

## 2020-12-14 ENCOUNTER — Inpatient Hospital Stay (HOSPITAL_COMMUNITY): Payer: BC Managed Care – PPO

## 2020-12-14 ENCOUNTER — Encounter (HOSPITAL_COMMUNITY): Payer: Self-pay | Admitting: Internal Medicine

## 2020-12-14 LAB — CBC WITH DIFFERENTIAL/PLATELET
Abs Immature Granulocytes: 0.07 10*3/uL (ref 0.00–0.07)
Basophils Absolute: 0 10*3/uL (ref 0.0–0.1)
Basophils Relative: 0 %
Eosinophils Absolute: 0.1 10*3/uL (ref 0.0–0.5)
Eosinophils Relative: 1 %
HCT: 32.9 % — ABNORMAL LOW (ref 36.0–46.0)
Hemoglobin: 10.9 g/dL — ABNORMAL LOW (ref 12.0–15.0)
Immature Granulocytes: 1 %
Lymphocytes Relative: 14 %
Lymphs Abs: 1.7 10*3/uL (ref 0.7–4.0)
MCH: 29.9 pg (ref 26.0–34.0)
MCHC: 33.1 g/dL (ref 30.0–36.0)
MCV: 90.4 fL (ref 80.0–100.0)
Monocytes Absolute: 0.9 10*3/uL (ref 0.1–1.0)
Monocytes Relative: 8 %
Neutro Abs: 8.8 10*3/uL — ABNORMAL HIGH (ref 1.7–7.7)
Neutrophils Relative %: 76 %
Platelets: 267 10*3/uL (ref 150–400)
RBC: 3.64 MIL/uL — ABNORMAL LOW (ref 3.87–5.11)
RDW: 14.3 % (ref 11.5–15.5)
WBC: 11.6 10*3/uL — ABNORMAL HIGH (ref 4.0–10.5)
nRBC: 0 % (ref 0.0–0.2)

## 2020-12-14 LAB — COMPREHENSIVE METABOLIC PANEL
ALT: 9 U/L (ref 0–44)
AST: 8 U/L — ABNORMAL LOW (ref 15–41)
Albumin: 2.8 g/dL — ABNORMAL LOW (ref 3.5–5.0)
Alkaline Phosphatase: 91 U/L (ref 38–126)
Anion gap: 9 (ref 5–15)
BUN: <5 mg/dL — ABNORMAL LOW (ref 6–20)
CO2: 25 mmol/L (ref 22–32)
Calcium: 8.3 mg/dL — ABNORMAL LOW (ref 8.9–10.3)
Chloride: 102 mmol/L (ref 98–111)
Creatinine, Ser: 0.95 mg/dL (ref 0.44–1.00)
GFR, Estimated: >60 mL/min (ref >60)
Glucose, Bld: 90 mg/dL (ref 70–99)
Potassium: 3.7 mmol/L (ref 3.5–5.1)
Sodium: 136 mmol/L (ref 135–145)
Total Bilirubin: 0.8 mg/dL (ref 0.3–1.2)
Total Protein: 6.5 g/dL (ref 6.5–8.1)

## 2020-12-14 MED ORDER — IOHEXOL 9 MG/ML PO SOLN
500.0000 mL | ORAL | Status: AC
  Administered 2020-12-14: 500 mL via ORAL

## 2020-12-14 MED ORDER — IOHEXOL 300 MG/ML  SOLN
100.0000 mL | Freq: Once | INTRAMUSCULAR | Status: AC | PRN
  Administered 2020-12-14: 100 mL via INTRAVENOUS

## 2020-12-14 NOTE — Progress Notes (Signed)
PROGRESS NOTE    Barbara Yates  ZHG:[id number] DOB: 07/23/1971 DOA: 12/10/2020 PCP: Isaac Bliss, Rayford Halsted, MD   Brief Narrative:  HPI: Barbara Yates is a 50 y.o. female with medical history significant of hyperlipidemia, diverticulitis, chronic back pain, depression, and anxiety presents with complaints of vomiting, diarrhea, and stomach pain.  Symptoms initially started 2 days ago with a mild pain in the right lower quadrant of her abdomen.  However, yesterday symptoms worsened to the point where she developed nausea vomiting, and diarrhea.  Emesis and diarrhea were both nonbloody in appearance.  Abdominal pain became severe pain in the right lower quadrant of her abdomen.  She tried taking Tylenol at home, but was unable to keep it down.  Symptoms similar to when patient was admitted into the hospital last month for perforated colonic diverticulitis with 8 cm abscess in the right lower quadrant vs. tubo-ovarian abscess.  She is treated with IV antibiotics and IR placed a drain.   Patient had followed up with Dr. Nadeen Landau of surgery in the outpatient setting and reports that the plan was to have resection of that part of the colon sometime in March or April.  This was in order to allow the patient to have a colonoscopy.  ED Course: Upon admission into the emergency department patient was seen to be afebrile, heart rate 72-1 31, respirations 16-22, blood pressures elevated up to 163/97, and O2 saturations maintained.  Labs from 1/6 significant for WBC 21.4, potassium 3.4, lipase 28, and lactic acid 1.1.  Urinalysis was negative for any acute abnormalities.  CT scan of the abdomen and pelvis with contrast revealed inflammatory mass in the right lower quadrant consistent with peridiverticular phlegmon with normal-appearing appendix.  Patient was given 1 L of lactated Ringers, Zosyn, antiemetics, and pain medication IV.  TRH called to admit.  Assessment & Plan:   Principal Problem:    Diverticulitis of colon Active Problems:   Anxiety   Hyperlipemia   Sepsis ([place])   Phlegmon   Hypokalemia   Sepsis secondary to phlegmon: Acute.  On admission patient was noted to be tachycardic and tachypneic with WBC elevated up to 21.4. Lactic acid was 1.1 after initial therapies were started.  CT abdomen indicative of possible sigmoid diverticulitis.  Patient's pain has improved, currently 2-3 out of 10 whereas it was 7 out of 10.  Her tenderness has improved and leukocytosis improved as well.  She remains afebrile.  Surgery on board.  Repeat CT abdomen ordered by surgery.  Continue Zosyn.  Nausea, vomiting, and diarrhea: Suspect secondary to above. -Symptomatic treatment as needed.  Elevated blood pressure: No history of hypertension but blood pressure elevated, mostly diastolic.  Could very well be due to pain.  She is on hydralazine but parameters are very high.  Will reduce parameters to 196 systolic and 222 diastolic.  Hypokalemia: Resolved.  Hyperlipidemia: Continue statin.  Anxiety and depression: Continue home medications.  DVT prophylaxis: Place and maintain sequential compression device Start: 12/11/20 1406 enoxaparin (LOVENOX) injection 40 mg Start: 12/11/20 1115   Code Status: Full Code  Family Communication: None present at bedside.  Plan of care discussed with patient in length and he verbalized understanding and agreed with it.  Status is: Inpatient  Remains inpatient appropriate because:Inpatient level of care appropriate due to severity of illness   Dispo: The patient is from: Home              Anticipated d/c is to: Home  Anticipated d/c date is: 1 to 2 days              Patient currently is not medically stable to d/c.        Estimated body mass index is 33.09 kg/m as calculated from the following:   Height as of 11/06/20: 5\' 9"  (1.753 m).   Weight as of 11/06/20: 101.6 kg.      Nutritional status:                Consultants:   General surgery  Procedures:   None  Antimicrobials:  Anti-infectives (From admission, onward)   Start     Dose/Rate Route Frequency Ordered Stop   12/11/20 1400  piperacillin-tazobactam (ZOSYN) IVPB 3.375 g        3.375 g 12.5 mL/hr over 240 Minutes Intravenous Every 8 hours 12/11/20 1241     12/11/20 1300  piperacillin-tazobactam (ZOSYN) IVPB 3.375 g  Status:  Discontinued        3.375 g 100 mL/hr over 30 Minutes Intravenous Every 6 hours 12/11/20 1223 12/11/20 1241   12/11/20 0700  piperacillin-tazobactam (ZOSYN) IVPB 3.375 g        3.375 g 100 mL/hr over 30 Minutes Intravenous  Once 12/11/20 0651 12/11/20 0829         Subjective: Patient seen and examined.  She states that she is improving.  Her pain is controlled with the pain medications and currently 2 out of 10.  Some nausea but no vomiting.  Tolerating clear liquids.  Objective: Vitals:   12/12/20 0336 12/12/20 1442 12/13/20 0544 12/14/20 0513  BP: (!) 141/95 134/86 131/82 (!) 158/115  Pulse: 97 88 80 81  Resp: 18 18 16 18   Temp: 99.1 F (37.3 C) 98.2 F (36.8 C) 98.4 F (36.9 C) 98.3 F (36.8 C)  TempSrc: Oral Oral Oral Oral  SpO2: 96% 100% 98% 100%   No intake or output data in the 24 hours ending 12/14/20 1156 There were no vitals filed for this visit.  Examination:  General exam: Appears calm and comfortable  Respiratory system: Clear to auscultation. Respiratory effort normal. Cardiovascular system: S1 & S2 heard, RRR. No JVD, murmurs, rubs, gallops or clicks. No pedal edema. Gastrointestinal system: Abdomen is nondistended, soft and mild to moderate tenderness right lower quadrant. No organomegaly or masses felt. Normal bowel sounds heard. Central nervous system: Alert and oriented. No focal neurological deficits. Extremities: Symmetric 5 x 5 power. Skin: No rashes, lesions or ulcers.  Psychiatry: Judgement and insight appear normal. Mood & affect appropriate.   Data  Reviewed: I have personally reviewed following labs and imaging studies  CBC: Recent Labs  Lab 12/10/20 1713 12/11/20 0804 12/12/20 0121 12/13/20 0108 12/14/20 0102  WBC 21.4* 17.7* 17.5* 14.4* 11.6*  NEUTROABS  --  15.6*  --   --  8.8*  HGB 14.2 14.0 11.7* 10.8* 10.9*  HCT 43.6 44.2 37.2 32.6* 32.9*  MCV 91.6 92.5 93.0 91.8 90.4  PLT 322 282 251 242 [id number]   Basic Metabolic Panel: Recent Labs  Lab 12/10/20 1713 12/12/20 0121 12/13/20 0108 12/14/20 0102  NA 136 135 134* 136  K 3.4* 4.2 3.3* 3.7  CL 100 102 102 102  CO2 22 23 22 25   GLUCOSE 137* 104* 84 90  BUN 5* 6 5* <5*  CREATININE 0.99 1.15* 1.03* 0.95  CALCIUM 9.1 8.2* 7.9* 8.3*  MG  --   --  1.9  --    GFR: CrCl cannot be calculated (Unknown  ideal weight.). Liver Function Tests: Recent Labs  Lab 12/10/20 1713 12/14/20 0102  AST 14* 8*  ALT 13 9  ALKPHOS 100 91  BILITOT 1.2 0.8  PROT 7.6 6.5  ALBUMIN 3.9 2.8*   Recent Labs  Lab 12/10/20 1713  LIPASE 28   No results for input(s): AMMONIA in the last 168 hours. Coagulation Profile: No results for input(s): INR, PROTIME in the last 168 hours. Cardiac Enzymes: No results for input(s): CKTOTAL, CKMB, CKMBINDEX, TROPONINI in the last 168 hours. BNP (last 3 results) No results for input(s): PROBNP in the last 8760 hours. HbA1C: No results for input(s): HGBA1C in the last 72 hours. CBG: No results for input(s): GLUCAP in the last 168 hours. Lipid Profile: No results for input(s): CHOL, HDL, LDLCALC, TRIG, CHOLHDL, LDLDIRECT in the last 72 hours. Thyroid Function Tests: No results for input(s): TSH, T4TOTAL, FREET4, T3FREE, THYROIDAB in the last 72 hours. Anemia Panel: No results for input(s): VITAMINB12, FOLATE, FERRITIN, TIBC, IRON, RETICCTPCT in the last 72 hours. Sepsis Labs: Recent Labs  Lab 12/11/20 0804  LATICACIDVEN 1.1    Recent Results (from the past 240 hour(s))  SARS CORONAVIRUS 2 (TAT 6-24 HRS) Nasopharyngeal Nasopharyngeal Swab      Status: None   Collection Time: 12/11/20 10:52 AM   Specimen: Nasopharyngeal Swab  Result Value Ref Range Status   SARS Coronavirus 2 NEGATIVE NEGATIVE Final    Comment: (NOTE) SARS-CoV-2 target nucleic acids are NOT DETECTED.  The SARS-CoV-2 RNA is generally detectable in upper and lower respiratory specimens during the acute phase of infection. Negative results do not preclude SARS-CoV-2 infection, do not rule out co-infections with other pathogens, and should not be used as the sole basis for treatment or other patient management decisions. Negative results must be combined with clinical observations, patient history, and epidemiological information. The expected result is Negative.  Fact Sheet for Patients: SugarRoll.be  Fact Sheet for Healthcare Providers: https://www.woods-mathews.com/  This test is not yet approved or cleared by the [place] FDA and  has been authorized for detection and/or diagnosis of SARS-CoV-2 by FDA under an Emergency Use Authorization (EUA). This EUA will remain  in effect (meaning this test can be used) for the duration of the COVID-19 declaration under Se ction 564(b)(1) of the Act, 21 U.S.C. section 360bbb-3(b)(1), unless the authorization is terminated or revoked sooner.  Performed at [facility], [place] [address] [place], [place] [address]   Culture, blood (routine x 2)     Status: None (Preliminary result)   Collection Time: 12/11/20  2:21 PM   Specimen: BLOOD  Result Value Ref Range Status   Specimen Description BLOOD SITE NOT SPECIFIED  Final   Special Requests   Final    BOTTLES DRAWN AEROBIC AND ANAEROBIC Blood Culture adequate volume   Culture   Final    NO GROWTH 3 DAYS Performed at [facility], [address], [place],  [address]    Report Status PENDING  Incomplete      Radiology Studies: No results found.  Scheduled Meds: . atorvastatin  10 mg Oral Daily   . citalopram  40 mg Oral Daily  . enoxaparin (LOVENOX) injection  40 mg Subcutaneous Daily  . iohexol  500 mL Oral Q1H  . sodium chloride flush  3 mL Intravenous Q12H   Continuous Infusions: . sodium chloride 75 mL/hr at 12/14/20 1146  . piperacillin-tazobactam (ZOSYN)  IV 3.375 g (12/14/20 0516)     LOS: 3 days  Time spent: 28 minutes   Darliss Cheney, MD [facility]  12/14/2020, 11:56 AM   To contact the attending provider between 7A-7P or the covering provider during after hours 7P-7A, please log into the web site [email or web address].

## 2020-12-14 NOTE — Progress Notes (Signed)
Subjective: Pain improving, but still with fair amount of pain across lower abdomen, but greatest in RLQ.  Tolerating CLD.    ROS: See above, otherwise other systems negative  Objective: Vital signs in last 24 hours: Temp:  [98.3 F (36.8 C)] 98.3 F (36.8 C) (01/10 0513) Pulse Rate:  [81] 81 (01/10 0513) Resp:  [18] 18 (01/10 0513) BP: (158)/(115) 158/115 (01/10 0513) SpO2:  [100 %] 100 % (01/10 0513) Last BM Date: 12/13/20  Intake/Output from previous day: No intake/output data recorded. Intake/Output this shift: No intake/output data recorded.  PE: Heart: Regular Lungs: CTAB Abd: soft, tenderness across lower abdomen, but greatest in RLQ, +BS, ND  Lab Results:  Recent Labs    12/13/20 0108 12/14/20 0102  WBC 14.4* 11.6*  HGB 10.8* 10.9*  HCT 32.6* 32.9*  PLT 242 267   BMET Recent Labs    12/13/20 0108 12/14/20 0102  NA 134* 136  K 3.3* 3.7  CL 102 102  CO2 22 25  GLUCOSE 84 90  BUN 5* <5*  CREATININE 1.03* 0.95  CALCIUM 7.9* 8.3*   PT/INR No results for input(s): LABPROT, INR in the last 72 hours. CMP     Component Value Date/Time   NA 136 12/14/2020 0102   NA 141 10/17/2018 0812   K 3.7 12/14/2020 0102   CL 102 12/14/2020 0102   CO2 25 12/14/2020 0102   GLUCOSE 90 12/14/2020 0102   BUN <5 (L) 12/14/2020 0102   BUN 10 10/17/2018 0812   CREATININE 0.95 12/14/2020 0102   CALCIUM 8.3 (L) 12/14/2020 0102   PROT 6.5 12/14/2020 0102   PROT 6.7 10/17/2018 0812   ALBUMIN 2.8 (L) 12/14/2020 0102   ALBUMIN 4.2 10/17/2018 0812   AST 8 (L) 12/14/2020 0102   ALT 9 12/14/2020 0102   ALKPHOS 91 12/14/2020 0102   BILITOT 0.8 12/14/2020 0102   BILITOT 0.2 10/17/2018 0812   GFRNONAA >60 12/14/2020 0102   GFRAA 82 10/17/2018 0812   Lipase     Component Value Date/Time   LIPASE 28 12/10/2020 1713       Studies/Results: No results found.  Anti-infectives: Anti-infectives (From admission, onward)   Start     Dose/Rate Route Frequency  Ordered Stop   12/11/20 1400  piperacillin-tazobactam (ZOSYN) IVPB 3.375 g        3.375 g 12.5 mL/hr over 240 Minutes Intravenous Every 8 hours 12/11/20 1241     12/11/20 1300  piperacillin-tazobactam (ZOSYN) IVPB 3.375 g  Status:  Discontinued        3.375 g 100 mL/hr over 30 Minutes Intravenous Every 6 hours 12/11/20 1223 12/11/20 1241   12/11/20 0700  piperacillin-tazobactam (ZOSYN) IVPB 3.375 g        3.375 g 100 mL/hr over 30 Minutes Intravenous  Once 12/11/20 0651 12/11/20 0829       Assessment/Plan Hyperlipidemia Chronic back pain Depression Anxiety Hx PID  H/O RLQ abscess, ? Secondary to diverticulitis vs TOA (felt more likely to be tics in origin), now with recurrent phlegmon -WBC normalizing, down to 11K today -pain improved, but still having some pain especially in RLQ -repeat CT scan today to see if phlegmon has improved or developed into an abscess that is drainable -cont with clear liquids, but if CT scan shows improvement, can advance diet. -mobilize/pulm toilet  FEN - CLD/IVF VTE - Lovenox  ID - zosyn   LOS: 3 days    Henreitta Cea , [facility]  [facility] 12/14/2020, 8:19 AM Please see Amion for pager number during day hours 7:00am-4:30pm or 7:00am -11:30am on weekends

## 2020-12-15 ENCOUNTER — Encounter (HOSPITAL_COMMUNITY): Payer: Self-pay | Admitting: Internal Medicine

## 2020-12-15 ENCOUNTER — Inpatient Hospital Stay (HOSPITAL_COMMUNITY): Payer: BC Managed Care – PPO

## 2020-12-15 LAB — CBC WITH DIFFERENTIAL/PLATELET
Abs Immature Granulocytes: 0.07 10*3/uL (ref 0.00–0.07)
Basophils Absolute: 0 10*3/uL (ref 0.0–0.1)
Basophils Relative: 1 %
Eosinophils Absolute: 0.2 10*3/uL (ref 0.0–0.5)
Eosinophils Relative: 2 %
HCT: 32.1 % — ABNORMAL LOW (ref 36.0–46.0)
Hemoglobin: 10.8 g/dL — ABNORMAL LOW (ref 12.0–15.0)
Immature Granulocytes: 1 %
Lymphocytes Relative: 20 %
Lymphs Abs: 1.5 10*3/uL (ref 0.7–4.0)
MCH: 30.2 pg (ref 26.0–34.0)
MCHC: 33.6 g/dL (ref 30.0–36.0)
MCV: 89.7 fL (ref 80.0–100.0)
Monocytes Absolute: 0.7 10*3/uL (ref 0.1–1.0)
Monocytes Relative: 8 %
Neutro Abs: 5.3 10*3/uL (ref 1.7–7.7)
Neutrophils Relative %: 68 %
Platelets: 289 10*3/uL (ref 150–400)
RBC: 3.58 MIL/uL — ABNORMAL LOW (ref 3.87–5.11)
RDW: 14.1 % (ref 11.5–15.5)
WBC: 7.8 10*3/uL (ref 4.0–10.5)
nRBC: 0 % (ref 0.0–0.2)

## 2020-12-15 LAB — BASIC METABOLIC PANEL
Anion gap: 9 (ref 5–15)
BUN: <5 mg/dL — ABNORMAL LOW (ref 6–20)
CO2: 26 mmol/L (ref 22–32)
Calcium: 8.3 mg/dL — ABNORMAL LOW (ref 8.9–10.3)
Chloride: 102 mmol/L (ref 98–111)
Creatinine, Ser: 1 mg/dL (ref 0.44–1.00)
GFR, Estimated: >60 mL/min (ref >60)
Glucose, Bld: 86 mg/dL (ref 70–99)
Potassium: 3.3 mmol/L — ABNORMAL LOW (ref 3.5–5.1)
Sodium: 137 mmol/L (ref 135–145)

## 2020-12-15 LAB — PROTIME-INR
INR: 1.1 (ref 0.8–1.2)
Prothrombin Time: 14 seconds (ref 11.4–15.2)

## 2020-12-15 MED ORDER — MIDAZOLAM HCL 2 MG/2ML IJ SOLN
INTRAMUSCULAR | Status: AC
  Filled 2020-12-15: qty 2

## 2020-12-15 MED ORDER — METHOCARBAMOL 500 MG PO TABS
500.0000 mg | ORAL_TABLET | Freq: Four times a day (QID) | ORAL | Status: DC | PRN
  Administered 2020-12-16: 500 mg via ORAL
  Filled 2020-12-15: qty 1

## 2020-12-15 MED ORDER — POTASSIUM CHLORIDE CRYS ER 20 MEQ PO TBCR
40.0000 meq | EXTENDED_RELEASE_TABLET | Freq: Once | ORAL | Status: AC
  Administered 2020-12-15: 40 meq via ORAL
  Filled 2020-12-15: qty 2

## 2020-12-15 MED ORDER — FENTANYL CITRATE (PF) 100 MCG/2ML IJ SOLN
INTRAMUSCULAR | Status: AC
  Filled 2020-12-15: qty 2

## 2020-12-15 MED ORDER — AMLODIPINE BESYLATE 5 MG PO TABS
5.0000 mg | ORAL_TABLET | Freq: Every day | ORAL | Status: DC
  Administered 2020-12-15 – 2020-12-17 (×3): 5 mg via ORAL
  Filled 2020-12-15 (×3): qty 1

## 2020-12-15 MED ORDER — FENTANYL CITRATE (PF) 100 MCG/2ML IJ SOLN
INTRAMUSCULAR | Status: AC | PRN
  Administered 2020-12-15 (×2): 25 ug via INTRAVENOUS
  Administered 2020-12-15: 50 ug via INTRAVENOUS

## 2020-12-15 MED ORDER — OXYCODONE HCL 5 MG PO TABS
5.0000 mg | ORAL_TABLET | ORAL | Status: DC | PRN
  Administered 2020-12-15 – 2020-12-17 (×8): 10 mg via ORAL
  Filled 2020-12-15 (×8): qty 2

## 2020-12-15 MED ORDER — ACETAMINOPHEN 500 MG PO TABS
1000.0000 mg | ORAL_TABLET | Freq: Four times a day (QID) | ORAL | Status: DC
  Administered 2020-12-15 – 2020-12-17 (×7): 1000 mg via ORAL
  Filled 2020-12-15 (×8): qty 2

## 2020-12-15 MED ORDER — MIDAZOLAM HCL 2 MG/2ML IJ SOLN
INTRAMUSCULAR | Status: AC | PRN
  Administered 2020-12-15 (×2): 0.5 mg via INTRAVENOUS
  Administered 2020-12-15: 1 mg via INTRAVENOUS

## 2020-12-15 NOTE — Progress Notes (Signed)
PROGRESS NOTE    Barbara Yates  [id number] DOB: 1971-11-20 DOA: 12/10/2020 PCP: Isaac Bliss, Rayford Halsted, MD   Brief Narrative:  HPI: Barbara Yates is a 50 y.o. female with medical history significant of hyperlipidemia, diverticulitis, chronic back pain, depression, and anxiety presents with complaints of vomiting, diarrhea, and stomach pain.  Symptoms initially started 2 days ago with a mild pain in the right lower quadrant of her abdomen.  However, yesterday symptoms worsened to the point where she developed nausea vomiting, and diarrhea.  Emesis and diarrhea were both nonbloody in appearance.  Abdominal pain became severe pain in the right lower quadrant of her abdomen.  She tried taking Tylenol at home, but was unable to keep it down.  Symptoms similar to when patient was admitted into the hospital last month for perforated colonic diverticulitis with 8 cm abscess in the right lower quadrant vs. tubo-ovarian abscess.  She is treated with IV antibiotics and IR placed a drain.   Patient had followed up with Dr. Nadeen Landau of surgery in the outpatient setting and reports that the plan was to have resection of that part of the colon sometime in March or April.  This was in order to allow the patient to have a colonoscopy.  ED Course: Upon admission into the emergency department patient was seen to be afebrile, heart rate 72-1 31, respirations 16-22, blood pressures elevated up to 163/97, and O2 saturations maintained.  Labs from 1/6 significant for WBC 21.4, potassium 3.4, lipase 28, and lactic acid 1.1.  Urinalysis was negative for any acute abnormalities.  CT scan of the abdomen and pelvis with contrast revealed inflammatory mass in the right lower quadrant consistent with peridiverticular phlegmon with normal-appearing appendix.  Patient was given 1 L of lactated Ringers, Zosyn, antiemetics, and pain medication IV.  TRH called to admit.  Assessment & Plan:   Principal Problem:    Diverticulitis of colon Active Problems:   Anxiety   Hyperlipemia   Sepsis ([place])   Phlegmon   Hypokalemia   Sepsis secondary to phlegmon: Acute.  On admission patient was noted to be tachycardic and tachypneic with WBC elevated up to 21.4. Lactic acid was 1.1 after initial therapies were started.  CT abdomen indicative of possible sigmoid diverticulitis.  Leukocytosis resolved.  Patient had repeat CT abdomen and pelvis on 12/14/2020 which showed development of abscess.  IR has been consulted for intra-abdominal drain placement which will happen today.  Patient's pain is controlled with the pain medications and she is tolerating clear liquid diet.  Continue Zosyn.  Management per general surgery.  Nausea, vomiting, and diarrhea: Suspect secondary to above. -Symptomatic treatment as needed.  Elevated blood pressure: No history of hypertension but patient's blood pressure has remained persistently elevated, mostly diastolic.  Will start on amlodipine 5 mg p.o. daily and continue as needed hydralazine.  Hypokalemia: Low again.  Will replace.  Hyperlipidemia: Continue statin.  Anxiety and depression: Continue home medications.  DVT prophylaxis: Place and maintain sequential compression device Start: 12/11/20 1406 enoxaparin (LOVENOX) injection 40 mg Start: 12/11/20 1115   Code Status: Full Code  Family Communication: None present at bedside.  Plan of care discussed with patient in length and he verbalized understanding and agreed with it.  Status is: Inpatient  Remains inpatient appropriate because:Inpatient level of care appropriate due to severity of illness   Dispo: The patient is from: Home              Anticipated d/c is  to: Home              Anticipated d/c date is: 1 to 2 days              Patient currently is not medically stable to d/c.        Estimated body mass index is 33.09 kg/m as calculated from the following:   Height as of 11/06/20: 5\' 9"  (1.753 m).    Weight as of 11/06/20: 101.6 kg.      Nutritional status:               Consultants:   General surgery  Procedures:   None  Antimicrobials:  Anti-infectives (From admission, onward)   Start     Dose/Rate Route Frequency Ordered Stop   12/11/20 1400  piperacillin-tazobactam (ZOSYN) IVPB 3.375 g        3.375 g 12.5 mL/hr over 240 Minutes Intravenous Every 8 hours 12/11/20 1241     12/11/20 1300  piperacillin-tazobactam (ZOSYN) IVPB 3.375 g  Status:  Discontinued        3.375 g 100 mL/hr over 30 Minutes Intravenous Every 6 hours 12/11/20 1223 12/11/20 1241   12/11/20 0700  piperacillin-tazobactam (ZOSYN) IVPB 3.375 g        3.375 g 100 mL/hr over 30 Minutes Intravenous  Once 12/11/20 0651 12/11/20 0829         Subjective: Seen and examined.  Pain controlled with pain medication.  No nausea.  Tolerating liquids.  No other complaint.  Objective: Vitals:   12/14/20 1504 12/14/20 1928 12/15/20 0426 12/15/20 0525  BP: (!) 149/90 (!) 166/104 (!) 142/101 (!) 148/90  Pulse: 75 75 76   Resp:  20 17   Temp:  98.3 F (36.8 C) 98.2 F (36.8 C)   TempSrc:   Oral   SpO2: 100% 97% 99%     Intake/Output Summary (Last 24 hours) at 12/15/2020 1013 Last data filed at 12/15/2020 1540 Gross per 24 hour  Intake 4194.6 ml  Output --  Net 4194.6 ml   There were no vitals filed for this visit.  Examination:  General exam: Appears calm and comfortable  Respiratory system: Clear to auscultation. Respiratory effort normal. Cardiovascular system: S1 & S2 heard, RRR. No JVD, murmurs, rubs, gallops or clicks. No pedal edema. Gastrointestinal system: Abdomen is nondistended, soft and right lower quadrant tenderness. No organomegaly or masses felt. Normal bowel sounds heard. Central nervous system: Alert and oriented. No focal neurological deficits. Extremities: Symmetric 5 x 5 power. Skin: No rashes, lesions or ulcers.  Psychiatry: Judgement and insight appear normal. Mood &  affect appropriate.    Data Reviewed: I have personally reviewed following labs and imaging studies  CBC: Recent Labs  Lab 12/11/20 0804 12/12/20 0121 12/13/20 0108 12/14/20 0102 12/15/20 0255  WBC 17.7* 17.5* 14.4* 11.6* 7.8  NEUTROABS 15.6*  --   --  8.8* 5.3  HGB 14.0 11.7* 10.8* 10.9* 10.8*  HCT 44.2 37.2 32.6* 32.9* 32.1*  MCV 92.5 93.0 91.8 90.4 89.7  PLT 282 251 242 267 086   Basic Metabolic Panel: Recent Labs  Lab 12/10/20 1713 12/12/20 0121 12/13/20 0108 12/14/20 0102 12/15/20 0255  NA 136 135 134* 136 137  K 3.4* 4.2 3.3* 3.7 3.3*  CL 100 102 102 102 102  CO2 22 23 22 25 26   GLUCOSE 137* 104* 84 90 86  BUN 5* 6 5* <5* <5*  CREATININE 0.99 1.15* 1.03* 0.95 1.00  CALCIUM 9.1 8.2* 7.9* 8.3* 8.3*  MG  --   --  1.9  --   --    GFR: CrCl cannot be calculated (Unknown ideal weight.). Liver Function Tests: Recent Labs  Lab 12/10/20 1713 12/14/20 0102  AST 14* 8*  ALT 13 9  ALKPHOS 100 91  BILITOT 1.2 0.8  PROT 7.6 6.5  ALBUMIN 3.9 2.8*   Recent Labs  Lab 12/10/20 1713  LIPASE 28   No results for input(s): AMMONIA in the last 168 hours. Coagulation Profile: Recent Labs  Lab 12/15/20 0255  INR 1.1   Cardiac Enzymes: No results for input(s): CKTOTAL, CKMB, CKMBINDEX, TROPONINI in the last 168 hours. BNP (last 3 results) No results for input(s): PROBNP in the last 8760 hours. HbA1C: No results for input(s): HGBA1C in the last 72 hours. CBG: No results for input(s): GLUCAP in the last 168 hours. Lipid Profile: No results for input(s): CHOL, HDL, LDLCALC, TRIG, CHOLHDL, LDLDIRECT in the last 72 hours. Thyroid Function Tests: No results for input(s): TSH, T4TOTAL, FREET4, T3FREE, THYROIDAB in the last 72 hours. Anemia Panel: No results for input(s): VITAMINB12, FOLATE, FERRITIN, TIBC, IRON, RETICCTPCT in the last 72 hours. Sepsis Labs: Recent Labs  Lab 12/11/20 0804  LATICACIDVEN 1.1    Recent Results (from the past 240 hour(s))  SARS  CORONAVIRUS 2 (TAT 6-24 HRS) Nasopharyngeal Nasopharyngeal Swab     Status: None   Collection Time: 12/11/20 10:52 AM   Specimen: Nasopharyngeal Swab  Result Value Ref Range Status   SARS Coronavirus 2 NEGATIVE NEGATIVE Final    Comment: (NOTE) SARS-CoV-2 target nucleic acids are NOT DETECTED.  The SARS-CoV-2 RNA is generally detectable in upper and lower respiratory specimens during the acute phase of infection. Negative results do not preclude SARS-CoV-2 infection, do not rule out co-infections with other pathogens, and should not be used as the sole basis for treatment or other patient management decisions. Negative results must be combined with clinical observations, patient history, and epidemiological information. The expected result is Negative.  Fact Sheet for Patients: SugarRoll.be  Fact Sheet for Healthcare Providers: https://www.woods-mathews.com/  This test is not yet approved or cleared by the [place] FDA and  has been authorized for detection and/or diagnosis of SARS-CoV-2 by FDA under an Emergency Use Authorization (EUA). This EUA will remain  in effect (meaning this test can be used) for the duration of the COVID-19 declaration under Se ction 564(b)(1) of the Act, 21 U.S.C. section 360bbb-3(b)(1), unless the authorization is terminated or revoked sooner.  Performed at [facility], [place] [address] [place], [place] [address]   Culture, blood (routine x 2)     Status: None (Preliminary result)   Collection Time: 12/11/20  2:21 PM   Specimen: BLOOD  Result Value Ref Range Status   Specimen Description BLOOD SITE NOT SPECIFIED  Final   Special Requests   Final    BOTTLES DRAWN AEROBIC AND ANAEROBIC Blood Culture adequate volume   Culture   Final    NO GROWTH 4 DAYS Performed at [facility], [address], [place], [place] [address]    Report Status PENDING  Incomplete      Radiology Studies: CT  ABDOMEN PELVIS W CONTRAST  Result Date: 12/14/2020 CLINICAL DATA:  Diverticulitis. EXAM: CT ABDOMEN AND PELVIS WITH CONTRAST TECHNIQUE: Multidetector CT imaging of the abdomen and pelvis was performed using the standard protocol following bolus administration of intravenous contrast. CONTRAST:  155mL OMNIPAQUE IOHEXOL 300 MG/ML  SOLN COMPARISON:  12/11/2020 FINDINGS: Lower chest:  Trace right pleural effusion. Hepatobiliary: Cyst in posterior right hepatic lobe measures 6 mm, image 27/3. The gallbladder is unremarkable. No biliary ductal dilatation. Pancreas: Unremarkable. No pancreatic ductal dilatation or surrounding inflammatory changes. Spleen: Normal in size without focal abnormality. Adrenals/Urinary Tract: Normal appearance of the adrenal glands. No kidney mass or hydronephrosis identified bilaterally. Urinary bladder is unremarkable. Stomach/Bowel: Stomach appears normal. No pathologic dilatation of the large or small bowel loops. Complex inflammatory mass within the right lower quadrant of the abdomen is again noted. This measures 5.7 x 5.8 by 4.5 cm, image 64/3. Previously 5.5 x 4.0 x 4.1 cm. On today's study the mass contains multiple internal areas of loculation this is. The largest loculated component is noted centrally with air-fluid level measuring 3.8 cm, image 64/3. Surrounding soft tissue inflammatory changes including fat stranding and free fluid noted. The mass abuts the medial wall of the cecum and right lateral aspect of the mid sigmoid colon. The adjacent portion of sigmoid colon shows mild inflammatory changes and wall thickening. Vascular/Lymphatic: Aortic atherosclerosis without aneurysm. Multiple borderline enlarged retroperitoneal lymph nodes are identified, likely reactive. These measure up to 9 cm. Prominent bilateral pelvic lymph nodes are also noted without signs of adenopathy. Reproductive: The uterus appears prominent as noted on previous imaging. Corpus luteal cyst noted within  the left adnexa measuring 2.6 cm. Other: No significant pneumoperitoneum. Musculoskeletal: Degenerative disc disease identified within the lumbar spine. IMPRESSION: 1. Complex inflammatory mass within the right lower quadrant of the abdomen is again noted. The mass has increased in size from previous exam and now contains multiple internal areas of loculation. Findings are concerning for recurrence abscess formation. The mass abuts the medial wall of the cecum and right lateral aspect of the mid sigmoid colon. The adjacent portion of the sigmoid colon shows mild inflammatory changes and wall thickening. 2. Borderline enlarged retroperitoneal and pelvic lymph nodes are identified, likely reactive. 3. Trace right pleural effusion. 4. Aortic atherosclerosis. Aortic Atherosclerosis (ICD10-I70.0). Electronically Signed   By: Kerby Moors M.D.   On: 12/14/2020 13:43    Scheduled Meds: . atorvastatin  10 mg Oral Daily  . citalopram  40 mg Oral Daily  . enoxaparin (LOVENOX) injection  40 mg Subcutaneous Daily  . potassium chloride  40 mEq Oral Once  . sodium chloride flush  3 mL Intravenous Q12H   Continuous Infusions: . sodium chloride 75 mL/hr at 12/15/20 0816  . piperacillin-tazobactam (ZOSYN)  IV 3.375 g (12/15/20 0525)     LOS: 4 days   Time spent: 27 minutes   Darliss Cheney, MD [facility]  12/15/2020, 10:13 AM   To contact the attending provider between 7A-7P or the covering provider during after hours 7P-7A, please log into the web site [email or web address].

## 2020-12-15 NOTE — Plan of Care (Signed)

## 2020-12-15 NOTE — Sedation Documentation (Signed)
Call made to 6N for pt handoff report, No response at this time, Violeta Gelinas unable to receive report. Will attempt to call again.

## 2020-12-15 NOTE — Consult Note (Addendum)
Chief Complaint: Patient was seen in consultation today for intra abdominal abscess drain placement Chief Complaint  Patient presents with  . Abdominal Pain   at the request of Dr Ayesha Rumpf   Supervising Physician: Jacqulynn Cadet  Patient Status: [facility] - In-pt  History of Present Illness: Barbara Yates is a 50 y.o. female   Pt with Hx RLQ abscess drain placed in IR 11/07/20 Did well and was seen in follow up at IR OP [facility] Injection performed that day revealed no fistula; CT showing resolved abscess Drain removed without complication Pt developed RLQ pain again 12/10/20 Pain; N/V and diarrhea Was seen in ED Admitted after CT revealing recurrent collection Yesterday CT:   IMPRESSION: 1. Complex inflammatory mass within the right lower quadrant of the abdomen is again noted. The mass has increased in size from previous exam and now contains multiple internal areas of loculation. Findings are concerning for recurrence abscess formation. The mass abuts the medial wall of the cecum and right lateral aspect of the mid sigmoid colon. The adjacent portion of the sigmoid colon shows mild inflammatory changes and wall thickening.  Scheduled now for intra abdominal abscess drain placement    Past Medical History:  Diagnosis Date  . Anxiety   . Chronic back pain   . Depression   . Diverticulitis   . Hyperlipidemia   . Insomnia   . Migraine headache   . Myofascial pain     Past Surgical History:  Procedure Laterality Date  . CERVICAL SPINE SURGERY    . IR RADIOLOGIST EVAL & MGMT  11/17/2020  . TUBAL LIGATION      Allergies: Patient has no known allergies.  Medications: Prior to Admission medications   Medication Sig Start Date End Date Taking? Authorizing Provider  acetaminophen (TYLENOL) 500 MG tablet Take 1,000 mg by mouth every 6 (six) hours as needed for moderate pain.   Yes [provider]  ALPRAZolam (XANAX) 1 MG tablet TAKE 1 TABLET EVERY DAY AS  NEEDED FOR ANXIETY Patient taking differently: Take 1 mg by mouth daily as needed for anxiety. 12/03/20  Yes Isaac Bliss, Rayford Halsted, MD  atorvastatin (LIPITOR) 10 MG tablet Take 1 tablet (10 mg total) by mouth daily. 11/06/20  Yes Isaac Bliss, Rayford Halsted, MD  citalopram (CELEXA) 40 MG tablet TAKE 1 TABLET BY MOUTH EVERY DAY Patient taking differently: Take 40 mg by mouth daily. 12/03/20  Yes Isaac Bliss, Rayford Halsted, MD  cyclobenzaprine (FLEXERIL) 10 MG tablet TAKE 1 TABLET BY MOUTH AT BEDTIME AS NEEDED FOR SPASMS Patient taking differently: Take 10 mg by mouth at bedtime as needed for muscle spasms. 12/02/20  Yes Isaac Bliss, Rayford Halsted, MD  ibuprofen (ADVIL) 200 MG tablet Take 600 mg by mouth every 6 (six) hours as needed (pain).   Yes [provider]  valACYclovir (VALTREX) 1000 MG tablet TAKE 1 TABLET BY MOUTH EVERY DAY Patient taking differently: Take 1,000 mg by mouth daily as needed (breakouts). 07/21/20  Yes Isaac Bliss, Rayford Halsted, MD  zolpidem (AMBIEN) 10 MG tablet TAKE 1 TABLET BY MOUTH AT BEDTIME AS NEEDED FOR SLEEP. Patient taking differently: Take 10 mg by mouth at bedtime as needed for sleep. 12/02/20  Yes Isaac Bliss, Rayford Halsted, MD  ondansetron (ZOFRAN ODT) 4 MG disintegrating tablet Take 1 tablet (4 mg total) by mouth every 8 (eight) hours as needed for nausea or vomiting. Patient not taking: Reported on 12/11/2020 10/28/20   Barrie Folk, PA-C  oxyCODONE (ROXICODONE)  5 MG immediate release tablet Take 1 tablet (5 mg total) by mouth every 4 (four) hours as needed for moderate pain or severe pain. Patient not taking: Reported on 12/11/2020 11/09/20   Saverio Danker, PA-C     Family History  Problem Relation Age of Onset  . Hypertension Paternal Grandmother   . CVA Paternal Grandmother   . Alzheimer's disease Paternal Grandmother   . Cancer Paternal Grandfather        Pancreatic  . Pancreatic cancer Maternal Grandfather     Social History    Socioeconomic History  . Marital status: Significant Other    Spouse name: Not on file  . Number of children: Not on file  . Years of education: Not on file  . Highest education level: Not on file  Occupational History  . Not on file  Tobacco Use  . Smoking status: Former Smoker    Packs/day: 0.25    Years: 3.00    Pack years: 0.75    Types: Cigarettes    Start date: 08/13/1995    Quit date: 09/27/1998    Years since quitting: 22.2  . Smokeless tobacco: Never Used  . Tobacco comment: quit in 1999  Vaping Use  . Vaping Use: Never used  Substance and Sexual Activity  . Alcohol use: Yes    Alcohol/week: 4.0 standard drinks    Types: 4 Shots of liquor per week  . Drug use: No  . Sexual activity: Yes    Birth control/protection: Surgical  Other Topics Concern  . Not on file  Social History Narrative   ** Merged History Encounter **       Social Determinants of Health   Financial Resource Strain: Not on file  Food Insecurity: Not on file  Transportation Needs: Not on file  Physical Activity: Not on file  Stress: Not on file  Social Connections: Not on file    Review of Systems: A 12 point ROS discussed and pertinent positives are indicated in the HPI above.  All other systems are negative.  Review of Systems  Constitutional: Positive for activity change and appetite change. Negative for fever.  Respiratory: Negative for cough and shortness of breath.   Cardiovascular: Negative for chest pain.  Gastrointestinal: Positive for abdominal pain, diarrhea, nausea and vomiting.  Psychiatric/Behavioral: Negative for behavioral problems and confusion.    Vital Signs: BP (!) 148/90 (BP Location: Right Arm)   Pulse 76   Temp 98.2 F (36.8 C) (Oral)   Resp 17   SpO2 99%   Physical Exam Vitals reviewed.  Cardiovascular:     Rate and Rhythm: Normal rate and regular rhythm.  Pulmonary:     Effort: Pulmonary effort is normal.     Breath sounds: Normal breath sounds.   Abdominal:     General: Bowel sounds are normal.     Palpations: Abdomen is soft.     Tenderness: There is abdominal tenderness in the right lower quadrant.  Skin:    General: Skin is warm.  Neurological:     Mental Status: She is alert and oriented to person, place, and time.  Psychiatric:        Behavior: Behavior normal.     Imaging: CT ABDOMEN PELVIS W CONTRAST  Result Date: 12/14/2020 CLINICAL DATA:  Diverticulitis. EXAM: CT ABDOMEN AND PELVIS WITH CONTRAST TECHNIQUE: Multidetector CT imaging of the abdomen and pelvis was performed using the standard protocol following bolus administration of intravenous contrast. CONTRAST:  131mL OMNIPAQUE IOHEXOL 300 MG/ML  SOLN COMPARISON:  12/11/2020 FINDINGS: Lower chest: Trace right pleural effusion. Hepatobiliary: Cyst in posterior right hepatic lobe measures 6 mm, image 27/3. The gallbladder is unremarkable. No biliary ductal dilatation. Pancreas: Unremarkable. No pancreatic ductal dilatation or surrounding inflammatory changes. Spleen: Normal in size without focal abnormality. Adrenals/Urinary Tract: Normal appearance of the adrenal glands. No kidney mass or hydronephrosis identified bilaterally. Urinary bladder is unremarkable. Stomach/Bowel: Stomach appears normal. No pathologic dilatation of the large or small bowel loops. Complex inflammatory mass within the right lower quadrant of the abdomen is again noted. This measures 5.7 x 5.8 by 4.5 cm, image 64/3. Previously 5.5 x 4.0 x 4.1 cm. On today's study the mass contains multiple internal areas of loculation this is. The largest loculated component is noted centrally with air-fluid level measuring 3.8 cm, image 64/3. Surrounding soft tissue inflammatory changes including fat stranding and free fluid noted. The mass abuts the medial wall of the cecum and right lateral aspect of the mid sigmoid colon. The adjacent portion of sigmoid colon shows mild inflammatory changes and wall thickening.  Vascular/Lymphatic: Aortic atherosclerosis without aneurysm. Multiple borderline enlarged retroperitoneal lymph nodes are identified, likely reactive. These measure up to 9 cm. Prominent bilateral pelvic lymph nodes are also noted without signs of adenopathy. Reproductive: The uterus appears prominent as noted on previous imaging. Corpus luteal cyst noted within the left adnexa measuring 2.6 cm. Other: No significant pneumoperitoneum. Musculoskeletal: Degenerative disc disease identified within the lumbar spine. IMPRESSION: 1. Complex inflammatory mass within the right lower quadrant of the abdomen is again noted. The mass has increased in size from previous exam and now contains multiple internal areas of loculation. Findings are concerning for recurrence abscess formation. The mass abuts the medial wall of the cecum and right lateral aspect of the mid sigmoid colon. The adjacent portion of the sigmoid colon shows mild inflammatory changes and wall thickening. 2. Borderline enlarged retroperitoneal and pelvic lymph nodes are identified, likely reactive. 3. Trace right pleural effusion. 4. Aortic atherosclerosis. Aortic Atherosclerosis (ICD10-I70.0). Electronically Signed   By: Kerby Moors M.D.   On: 12/14/2020 13:43   CT ABDOMEN PELVIS W CONTRAST  Result Date: 12/11/2020 CLINICAL DATA:  Right lower quadrant abdominal pain. History of diverticulitis with abscess. EXAM: CT ABDOMEN AND PELVIS WITH CONTRAST TECHNIQUE: Multidetector CT imaging of the abdomen and pelvis was performed using the standard protocol following bolus administration of intravenous contrast. CONTRAST:  149mL OMNIPAQUE IOHEXOL 350 MG/ML SOLN COMPARISON:  11/17/2020, 11/06/2020 and 10/28/2020. FINDINGS: Lower chest: Clear lung bases.  Heart normal in size. Hepatobiliary: Stable 7 mm segment 6 cyst. Liver otherwise unremarkable. Normal gallbladder. No bile duct dilation. Pancreas: Unremarkable. No pancreatic ductal dilatation or surrounding  inflammatory changes. Spleen: Normal in size without focal abnormality. Adrenals/Urinary Tract: Adrenal glands are unremarkable. Kidneys are normal, without renal calculi, focal lesion, or hydronephrosis. Bladder is unremarkable. Stomach/Bowel: Heterogeneous inflammatory mass abuts the medial margin of the cecum, containing a single small air bubble. There is a small tubular structure containing air that extends above this inflammatory collection, 5 mm in diameter consistent with the appendiceal tip. The remainder of the appendix appears confluent with the right lower quadrant inflammatory mass. The mass measures approximately 5.5 x 4.0 x 4.1 cm. There is heterogeneous attenuation that extends from the right aspect of the mid sigmoid colon to the inflammatory mass. This portion of the sigmoid colon shows mild adjacent inflammatory stranding and evidence of eccentric wall thickening as well as numerous diverticula. When compared to the prior  exam, the inflammatory mass is increased and the percutaneous drainage catheter has been removed. Remainder of the colon shows additional diverticula throughout the left colon, but no other evidence of inflammation. There is mild wall thickening of the distal ileum as it passes over the right lower quadrant inflammatory process. No other small bowel abnormality. Normal stomach. Vascular/Lymphatic: Scattered subcentimeter retroperitoneal lymph nodes, stable. Minimal aortic atherosclerosis. Reproductive: Prominent but stable uterus.  No ovarian masses. Other: No free air.  No ascites.  No hernia. Musculoskeletal: No fracture or acute finding. Degenerative changes of the lower lumbar spine most evident at L3-L4. No bone lesions. IMPRESSION: 1. Inflammatory mass in the right lower quadrant abutting the medial aspect of the cecum and contiguous with portions of the appendix. This appears to extend from the right margin of the mid sigmoid colon, consistent with a peridiverticular  phlegmon. No defined drainable abscess is visualized, however. The distal appendix is seen above this inflammatory mass, normal in diameter. 2. No other acute or abnormalities. 3. Minor aortic atherosclerosis. Electronically Signed   By: Lajean Manes M.D.   On: 12/11/2020 10:08   CT ABDOMEN PELVIS W CONTRAST  Result Date: 11/17/2020 CLINICAL DATA:  Right lower quadrant pain, with right adnexal collection identified on CT 11/06/2020, status post percutaneous drainage placement the following day. EXAM: CT ABDOMEN AND PELVIS WITH CONTRAST TECHNIQUE: Multidetector CT imaging of the abdomen and pelvis was performed using the standard protocol following bolus administration of intravenous contrast. CONTRAST:  180mL ISOVUE-300 IOPAMIDOL (ISOVUE-300) INJECTION 61% COMPARISON:  11/07/2020 and previous FINDINGS: Lower chest: Trace right pleural effusion as before. Lung bases clear. Hepatobiliary: Subcentimeter probable cyst in hepatic segment 6 as before. No new lesion or biliary ductal dilatation. Gallbladder unremarkable. Pancreas: Unremarkable. No pancreatic ductal dilatation or surrounding inflammatory changes. Spleen: Normal in size without focal abnormality. Adrenals/Urinary Tract: Adrenal glands are unremarkable. Kidneys are normal, without renal calculi, focal lesion, or hydronephrosis. Bladder is unremarkable. Stomach/Bowel: Stomach is nondistended. Small bowel decompressed. Appendix not discretely identified. Resolution of pericecal/right adnexal fluid collection post drain catheter placement. The colon is nondistended with scattered descending and sigmoid diverticula. Vascular/Lymphatic: Scattered aortoiliac calcified plaque without aneurysm or evident stenosis. Subcentimeter left para-aortic and aortocaval lymph nodes as before. No pelvic adenopathy. Reproductive: Enlarged somewhat heterogenous uterus stable. Decreasing left ovarian follicle; no follow-up required. Interval resolution of right adnexal gas  and fluid collection post percutaneous drain catheter placement; no new or residual undrained components. Other: No ascites.  No free air. Musculoskeletal: Lumbar spondylitic changes L3-L5. No fracture or worrisome bone lesion. IMPRESSION: 1. Interval resolution of right lower quadrant/adnexal gas and fluid collection post percutaneous drain catheter placement; no new or residual undrained components. 2. Descending and sigmoid diverticulosis. 3. Stable trace right pleural effusion. Aortic Atherosclerosis (ICD10-I70.0). Electronically Signed   By: Lucrezia Europe M.D.   On: 11/17/2020 14:40   DG Sinus/Fist Tube Chk-Non GI  Result Date: 11/17/2020 CLINICAL DATA:  TOA (tubo-ovarian abscess), post drain catheter placement 11/07/2020 EXAM: ABSCESS DRAIN CATHETER INJECTION UNDER FLUOROSCOPY TECHNIQUE: The procedure, risks (including but not limited to bleeding, infection, organ damage), benefits, and alternatives were explained to the patient. Questions regarding the procedure were encouraged and answered. The patient understands and consents to the procedure. Survey fluoroscopic inspection reveals stable position of right lower quadrant pigtail drain catheter. Injection demonstrates small decompressed residual abscess cavity. Some free spillage into the peritoneal cavity with further injection. No evidence of fistula to bowel. IMPRESSION: 1. Decompressed abscess cavity.  No fistula to  bowel. Electronically Signed   By: Lucrezia Europe M.D.   On: 11/17/2020 14:42   IR Radiologist Eval & Mgmt  Result Date: 11/17/2020 Please refer to notes tab for details about interventional procedure. (Op Note)   Labs:  CBC: Recent Labs    12/12/20 0121 12/13/20 0108 12/14/20 0102 12/15/20 0255  WBC 17.5* 14.4* 11.6* 7.8  HGB 11.7* 10.8* 10.9* 10.8*  HCT 37.2 32.6* 32.9* 32.1*  PLT 251 242 267 289    COAGS: Recent Labs    11/07/20 0909 12/15/20 0255  INR 1.1 1.1    BMP: Recent Labs    12/12/20 0121  12/13/20 0108 12/14/20 0102 12/15/20 0255  NA 135 134* 136 137  K 4.2 3.3* 3.7 3.3*  CL 102 102 102 102  CO2 23 22 25 26   GLUCOSE 104* 84 90 86  BUN 6 5* <5* <5*  CALCIUM 8.2* 7.9* 8.3* 8.3*  CREATININE 1.15* 1.03* 0.95 1.00  GFRNONAA 59* >60 >60 >60    LIVER FUNCTION TESTS: Recent Labs    10/28/20 1700 11/06/20 1807 12/10/20 1713 12/14/20 0102  BILITOT 0.6 0.3 1.2 0.8  AST 12* 10* 14* 8*  ALT 11 8 13 9   ALKPHOS 96 96 100 91  PROT 8.1 7.9 7.6 6.5  ALBUMIN 3.9 3.4* 3.9 2.8*    TUMOR MARKERS: No results for input(s): AFPTM, CEA, CA199, CHROMGRNA in the last 8760 hours.  Assessment and Plan:  Hx RLQ abscess drain 11/07/20 Removed 12/24 after CT revealing resolved abscess and injection showing NO fistula to bowel Now new pain and N/V/D CT showing recurrent intra abdominal abscess Scheduled now for abscess drain placement  Risks and benefits discussed with the patient including bleeding, infection, damage to adjacent structures, bowel perforation/fistula connection, and sepsis. All of the patient's questions were answered, patient is agreeable to proceed. Consent signed and in chart.   Thank you for this interesting consult.  I greatly enjoyed meeting Barbara Yates and look forward to participating in their care.  A copy of this report was sent to the requesting provider on this date.  Electronically Signed: Lavonia Drafts, PA-C 12/15/2020, 7:17 AM   I spent a total of 20 Minutes    in face to face in clinical consultation, greater than 50% of which was counseling/coordinating care for abscess drain

## 2020-12-15 NOTE — Procedures (Signed)
Interventional Radiology Procedure Note  Procedure: CT guided placement of a 49F drain via the RLQ.   Complications: None  Estimated Blood Loss: None  Recommendations: - Drain to JP - Flush Q shift   Signed,  Criselda Peaches, MD

## 2020-12-15 NOTE — Progress Notes (Signed)
Subjective: Tolerating clears still.  Still with pain in RQL.  CT confirmed recurrent abscess.  ROS: See above, otherwise other systems negative  Objective: Vital signs in last 24 hours: Temp:  [98.2 F (36.8 C)-98.3 F (36.8 C)] 98.2 F (36.8 C) (01/11 0426) Pulse Rate:  [75-76] 76 (01/11 0426) Resp:  [17-20] 17 (01/11 0426) BP: (142-166)/(90-104) 148/90 (01/11 0525) SpO2:  [97 %-100 %] 99 % (01/11 0426) Last BM Date: 12/14/20  Intake/Output from previous day: 01/10 0701 - 01/11 0700 In: 840 [P.O.:840] Out: -  Intake/Output this shift: Total I/O In: 3594.6 [I.V.:3273; IV Piggyback:321.7] Out: -   PE: Heart: Regular Lungs: CTAB Abd: soft, tenderness across lower abdomen, but greatest in RLQ, +BS, ND  Lab Results:  Recent Labs    12/14/20 0102 12/15/20 0255  WBC 11.6* 7.8  HGB 10.9* 10.8*  HCT 32.9* 32.1*  PLT 267 289   BMET Recent Labs    12/14/20 0102 12/15/20 0255  NA 136 137  K 3.7 3.3*  CL 102 102  CO2 25 26  GLUCOSE 90 86  BUN <5* <5*  CREATININE 0.95 1.00  CALCIUM 8.3* 8.3*   PT/INR Recent Labs    12/15/20 0255  LABPROT 14.0  INR 1.1   CMP     Component Value Date/Time   NA 137 12/15/2020 0255   NA 141 10/17/2018 0812   K 3.3 (L) 12/15/2020 0255   CL 102 12/15/2020 0255   CO2 26 12/15/2020 0255   GLUCOSE 86 12/15/2020 0255   BUN <5 (L) 12/15/2020 0255   BUN 10 10/17/2018 0812   CREATININE 1.00 12/15/2020 0255   CALCIUM 8.3 (L) 12/15/2020 0255   PROT 6.5 12/14/2020 0102   PROT 6.7 10/17/2018 0812   ALBUMIN 2.8 (L) 12/14/2020 0102   ALBUMIN 4.2 10/17/2018 0812   AST 8 (L) 12/14/2020 0102   ALT 9 12/14/2020 0102   ALKPHOS 91 12/14/2020 0102   BILITOT 0.8 12/14/2020 0102   BILITOT 0.2 10/17/2018 0812   GFRNONAA >60 12/15/2020 0255   GFRAA 82 10/17/2018 0812   Lipase     Component Value Date/Time   LIPASE 28 12/10/2020 1713       Studies/Results: CT ABDOMEN PELVIS W CONTRAST  Result Date: 12/14/2020 CLINICAL  DATA:  Diverticulitis. EXAM: CT ABDOMEN AND PELVIS WITH CONTRAST TECHNIQUE: Multidetector CT imaging of the abdomen and pelvis was performed using the standard protocol following bolus administration of intravenous contrast. CONTRAST:  184mL OMNIPAQUE IOHEXOL 300 MG/ML  SOLN COMPARISON:  12/11/2020 FINDINGS: Lower chest: Trace right pleural effusion. Hepatobiliary: Cyst in posterior right hepatic lobe measures 6 mm, image 27/3. The gallbladder is unremarkable. No biliary ductal dilatation. Pancreas: Unremarkable. No pancreatic ductal dilatation or surrounding inflammatory changes. Spleen: Normal in size without focal abnormality. Adrenals/Urinary Tract: Normal appearance of the adrenal glands. No kidney mass or hydronephrosis identified bilaterally. Urinary bladder is unremarkable. Stomach/Bowel: Stomach appears normal. No pathologic dilatation of the large or small bowel loops. Complex inflammatory mass within the right lower quadrant of the abdomen is again noted. This measures 5.7 x 5.8 by 4.5 cm, image 64/3. Previously 5.5 x 4.0 x 4.1 cm. On today's study the mass contains multiple internal areas of loculation this is. The largest loculated component is noted centrally with air-fluid level measuring 3.8 cm, image 64/3. Surrounding soft tissue inflammatory changes including fat stranding and free fluid noted. The mass abuts the medial wall of the cecum and right lateral aspect of the mid  sigmoid colon. The adjacent portion of sigmoid colon shows mild inflammatory changes and wall thickening. Vascular/Lymphatic: Aortic atherosclerosis without aneurysm. Multiple borderline enlarged retroperitoneal lymph nodes are identified, likely reactive. These measure up to 9 cm. Prominent bilateral pelvic lymph nodes are also noted without signs of adenopathy. Reproductive: The uterus appears prominent as noted on previous imaging. Corpus luteal cyst noted within the left adnexa measuring 2.6 cm. Other: No significant  pneumoperitoneum. Musculoskeletal: Degenerative disc disease identified within the lumbar spine. IMPRESSION: 1. Complex inflammatory mass within the right lower quadrant of the abdomen is again noted. The mass has increased in size from previous exam and now contains multiple internal areas of loculation. Findings are concerning for recurrence abscess formation. The mass abuts the medial wall of the cecum and right lateral aspect of the mid sigmoid colon. The adjacent portion of the sigmoid colon shows mild inflammatory changes and wall thickening. 2. Borderline enlarged retroperitoneal and pelvic lymph nodes are identified, likely reactive. 3. Trace right pleural effusion. 4. Aortic atherosclerosis. Aortic Atherosclerosis (ICD10-I70.0). Electronically Signed   By: Kerby Moors M.D.   On: 12/14/2020 13:43    Anti-infectives: Anti-infectives (From admission, onward)   Start     Dose/Rate Route Frequency Ordered Stop   12/11/20 1400  piperacillin-tazobactam (ZOSYN) IVPB 3.375 g        3.375 g 12.5 mL/hr over 240 Minutes Intravenous Every 8 hours 12/11/20 1241     12/11/20 1300  piperacillin-tazobactam (ZOSYN) IVPB 3.375 g  Status:  Discontinued        3.375 g 100 mL/hr over 30 Minutes Intravenous Every 6 hours 12/11/20 1223 12/11/20 1241   12/11/20 0700  piperacillin-tazobactam (ZOSYN) IVPB 3.375 g        3.375 g 100 mL/hr over 30 Minutes Intravenous  Once 12/11/20 0651 12/11/20 0829       Assessment/Plan Hyperlipidemia Chronic back pain Depression Anxiety Hx PID  H/O RLQ abscess, ? Secondary to diverticulitis vs TOA (felt more likely to be tics in origin), now with recurrent abscess -WBC normalized -CT with recurrent abscess.  IR to drain today -adv to soft diet following procedure today -mobilize/pulm toilet -likely home tomorrow or Thursday pending improvement following drain  FEN - adv to soft diet following drain placement VTE - Lovenox  ID - [email or web address]   LOS: 4 days    Henreitta Cea , [facility] 12/15/2020, 11:05 AM Please see Amion for pager number during day hours 7:00am-4:30pm or 7:00am -11:30am on weekends

## 2020-12-16 LAB — CBC WITH DIFFERENTIAL/PLATELET
Abs Immature Granulocytes: 0.06 10*3/uL (ref 0.00–0.07)
Basophils Absolute: 0 10*3/uL (ref 0.0–0.1)
Basophils Relative: 0 %
Eosinophils Absolute: 0.1 10*3/uL (ref 0.0–0.5)
Eosinophils Relative: 1 %
HCT: 34.6 % — ABNORMAL LOW (ref 36.0–46.0)
Hemoglobin: 11.3 g/dL — ABNORMAL LOW (ref 12.0–15.0)
Immature Granulocytes: 1 %
Lymphocytes Relative: 15 %
Lymphs Abs: 1.4 10*3/uL (ref 0.7–4.0)
MCH: 29.7 pg (ref 26.0–34.0)
MCHC: 32.7 g/dL (ref 30.0–36.0)
MCV: 90.8 fL (ref 80.0–100.0)
Monocytes Absolute: 0.7 10*3/uL (ref 0.1–1.0)
Monocytes Relative: 7 %
Neutro Abs: 6.7 10*3/uL (ref 1.7–7.7)
Neutrophils Relative %: 76 %
Platelets: 300 10*3/uL (ref 150–400)
RBC: 3.81 MIL/uL — ABNORMAL LOW (ref 3.87–5.11)
RDW: 14 % (ref 11.5–15.5)
WBC: 8.8 10*3/uL (ref 4.0–10.5)
nRBC: 0 % (ref 0.0–0.2)

## 2020-12-16 LAB — CULTURE, BLOOD (ROUTINE X 2)
Culture: NO GROWTH
Special Requests: ADEQUATE

## 2020-12-16 LAB — BASIC METABOLIC PANEL
Anion gap: 10 (ref 5–15)
BUN: <5 mg/dL — ABNORMAL LOW (ref 6–20)
CO2: 27 mmol/L (ref 22–32)
Calcium: 8.2 mg/dL — ABNORMAL LOW (ref 8.9–10.3)
Chloride: 100 mmol/L (ref 98–111)
Creatinine, Ser: 0.88 mg/dL (ref 0.44–1.00)
GFR, Estimated: >60 mL/min (ref >60)
Glucose, Bld: 112 mg/dL — ABNORMAL HIGH (ref 70–99)
Potassium: 3.7 mmol/L (ref 3.5–5.1)
Sodium: 137 mmol/L (ref 135–145)

## 2020-12-16 LAB — MAGNESIUM: Magnesium: 2.1 mg/dL (ref 1.7–2.4)

## 2020-12-16 MED ORDER — AMOXICILLIN-POT CLAVULANATE 875-125 MG PO TABS
1.0000 | ORAL_TABLET | Freq: Two times a day (BID) | ORAL | Status: DC
  Administered 2020-12-16 – 2020-12-17 (×3): 1 via ORAL
  Filled 2020-12-16 (×3): qty 1

## 2020-12-16 NOTE — Progress Notes (Signed)
PROGRESS NOTE    Barbara Yates  ZOX:[id number] DOB: [birth year] DOA: 12/10/2020 PCP: Isaac Bliss, Rayford Halsted, MD   Brief Narrative:  HPI: Barbara Yates is a 50 y.o. female with medical history significant of hyperlipidemia, diverticulitis, chronic back pain, depression, and anxiety presents with complaints of vomiting, diarrhea, and stomach pain.  Symptoms initially started 2 days ago with a mild pain in the right lower quadrant of her abdomen.  However, yesterday symptoms worsened to the point where she developed nausea vomiting, and diarrhea.  Emesis and diarrhea were both nonbloody in appearance.  Abdominal pain became severe pain in the right lower quadrant of her abdomen.  She tried taking Tylenol at home, but was unable to keep it down.  Symptoms similar to when patient was admitted into the hospital last month for perforated colonic diverticulitis with 8 cm abscess in the right lower quadrant vs. tubo-ovarian abscess.  She is treated with IV antibiotics and IR placed a drain.   Patient had followed up with Dr. Nadeen Landau of surgery in the outpatient setting and reports that the plan was to have resection of that part of the colon sometime in March or April.  This was in order to allow the patient to have a colonoscopy.  ED Course: Upon admission into the emergency department patient was seen to be afebrile, heart rate 72-1 31, respirations 16-22, blood pressures elevated up to 163/97, and O2 saturations maintained.  Labs from 1/6 significant for WBC 21.4, potassium 3.4, lipase 28, and lactic acid 1.1.  Urinalysis was negative for any acute abnormalities.  CT scan of the abdomen and pelvis with contrast revealed inflammatory mass in the right lower quadrant consistent with peridiverticular phlegmon with normal-appearing appendix.  Patient was given 1 L of lactated Ringers, Zosyn, antiemetics, and pain medication IV.  TRH called to admit.  Assessment & Plan:   Principal Problem:    Diverticulitis of colon Active Problems:   Anxiety   Hyperlipemia   Sepsis ([place])   Phlegmon   Hypokalemia   Sepsis secondary to phlegmon: Acute.  On admission patient was noted to be tachycardic and tachypneic with WBC elevated up to 21.4. Lactic acid was 1.1 after initial therapies were started.  CT abdomen indicative of possible sigmoid diverticulitis.  Leukocytosis resolved.  Patient had repeat CT abdomen and pelvis on 12/14/2020 which showed development of abscess.  Patient underwent intra-abdominal drain placement by IR on 12/15/2020.  Management per general surgery.  Nausea, vomiting, and diarrhea: Suspect secondary to above. -Symptomatic treatment as needed.  Elevated blood pressure: No history of hypertension but patient's blood pressure has remained persistently elevated, mostly diastolic so she was started on 5 mg of amlodipine on 12/15/2020.  Her blood pressure is normal now.  Continue this.  Hypokalemia: Resolved  Hyperlipidemia: Continue statin.  Anxiety and depression: Continue home medications.  Transition of care: General surgery has assumed care as primary attending.  Hospitalist service following as Optometrist for management of hypertension only which is now stable.  I will chart check tomorrow morning for blood pressure readings and adjust medications accordingly if needed.  We will be following peripherally for one more day and sign off tomorrow if her blood pressure remains stable.  DVT prophylaxis: Place and maintain sequential compression device Start: 12/11/20 1406 enoxaparin (LOVENOX) injection 40 mg Start: 12/11/20 1115   Code Status: Full Code  Family Communication: None present at bedside.  Plan of care discussed with patient in length and he verbalized understanding and  agreed with it.  Status is: Inpatient  Remains inpatient appropriate because:Inpatient level of care appropriate due to severity of illness   Dispo: The patient is from: Home               Anticipated d/c is to: Home              Anticipated d/c date is: 1 to 2 days              Patient currently is not medically stable to d/c.        Estimated body mass index is 33.09 kg/m as calculated from the following:   Height as of 11/06/20: 5\' 9"  (1.753 m).   Weight as of 11/06/20: 101.6 kg.      Nutritional status:               Consultants:   General surgery  Procedures:   None  Antimicrobials:  Anti-infectives (From admission, onward)   Start     Dose/Rate Route Frequency Ordered Stop   12/16/20 1000  amoxicillin-clavulanate (AUGMENTIN) 875-125 MG per tablet 1 tablet        1 tablet Oral Every 12 hours 12/16/20 0831     12/11/20 1400  piperacillin-tazobactam (ZOSYN) IVPB 3.375 g  Status:  Discontinued        3.375 g 12.5 mL/hr over 240 Minutes Intravenous Every 8 hours 12/11/20 1241 12/16/20 0831   12/11/20 1300  piperacillin-tazobactam (ZOSYN) IVPB 3.375 g  Status:  Discontinued        3.375 g 100 mL/hr over 30 Minutes Intravenous Every 6 hours 12/11/20 1223 12/11/20 1241   12/11/20 0700  piperacillin-tazobactam (ZOSYN) IVPB 3.375 g        3.375 g 100 mL/hr over 30 Minutes Intravenous  Once 12/11/20 0651 12/11/20 0829         Subjective: Patient seen and examined.  Still with right lower quadrant pain.  No other complaint.  Objective: Vitals:   12/15/20 1525 12/15/20 1626 12/15/20 2207 12/16/20 0417  BP: (!) 148/100 (!) 162/97 (!) 151/90 131/89  Pulse: 81 81 75 68  Resp: 17 (!) 22 20 20   Temp:  98.7 F (37.1 C) (!) 97.4 F (36.3 C) 98 F (36.7 C)  TempSrc:  Oral    SpO2: 99% 100% 97% 100%    Intake/Output Summary (Last 24 hours) at 12/16/2020 1017 Last data filed at 12/16/2020 0900 Gross per 24 hour  Intake 1083 ml  Output 2 ml  Net 1081 ml   There were no vitals filed for this visit.  Examination:  General exam: Appears calm and comfortable  Respiratory system: Clear to auscultation. Respiratory effort  normal. Cardiovascular system: S1 & S2 heard, RRR. No JVD, murmurs, rubs, gallops or clicks. No pedal edema. Gastrointestinal system: Abdomen is nondistended, soft and right lower quadrant tenderness. No organomegaly or masses felt. Normal bowel sounds heard. Central nervous system: Alert and oriented. No focal neurological deficits. Extremities: Symmetric 5 x 5 power. Skin: No rashes, lesions or ulcers.  Psychiatry: Judgement and insight appear normal. Mood & affect appropriate.   Data Reviewed: I have personally reviewed following labs and imaging studies  CBC: Recent Labs  Lab 12/11/20 0804 12/12/20 0121 12/13/20 0108 12/14/20 0102 12/15/20 0255 12/16/20 0107  WBC 17.7* 17.5* 14.4* 11.6* 7.8 8.8  NEUTROABS 15.6*  --   --  8.8* 5.3 6.7  HGB 14.0 11.7* 10.8* 10.9* 10.8* 11.3*  HCT 44.2 37.2 32.6* 32.9* 32.1* 34.6*  MCV 92.5 93.0  91.8 90.4 89.7 90.8  PLT 282 251 242 267 289 267   Basic Metabolic Panel: Recent Labs  Lab 12/12/20 0121 12/13/20 0108 12/14/20 0102 12/15/20 0255 12/16/20 0107  NA 135 134* 136 137 137  K 4.2 3.3* 3.7 3.3* 3.7  CL 102 102 102 102 100  CO2 23 22 25 26 27   GLUCOSE 104* 84 90 86 112*  BUN 6 5* <5* <5* <5*  CREATININE 1.15* 1.03* 0.95 1.00 0.88  CALCIUM 8.2* 7.9* 8.3* 8.3* 8.2*  MG  --  1.9  --   --  2.1   GFR: CrCl cannot be calculated (Unknown ideal weight.). Liver Function Tests: Recent Labs  Lab 12/10/20 1713 12/14/20 0102  AST 14* 8*  ALT 13 9  ALKPHOS 100 91  BILITOT 1.2 0.8  PROT 7.6 6.5  ALBUMIN 3.9 2.8*   Recent Labs  Lab 12/10/20 1713  LIPASE 28   No results for input(s): AMMONIA in the last 168 hours. Coagulation Profile: Recent Labs  Lab 12/15/20 0255  INR 1.1   Cardiac Enzymes: No results for input(s): CKTOTAL, CKMB, CKMBINDEX, TROPONINI in the last 168 hours. BNP (last 3 results) No results for input(s): PROBNP in the last 8760 hours. HbA1C: No results for input(s): HGBA1C in the last 72 hours. CBG: No  results for input(s): GLUCAP in the last 168 hours. Lipid Profile: No results for input(s): CHOL, HDL, LDLCALC, TRIG, CHOLHDL, LDLDIRECT in the last 72 hours. Thyroid Function Tests: No results for input(s): TSH, T4TOTAL, FREET4, T3FREE, THYROIDAB in the last 72 hours. Anemia Panel: No results for input(s): VITAMINB12, FOLATE, FERRITIN, TIBC, IRON, RETICCTPCT in the last 72 hours. Sepsis Labs: Recent Labs  Lab 12/11/20 0804  LATICACIDVEN 1.1    Recent Results (from the past 240 hour(s))  SARS CORONAVIRUS 2 (TAT 6-24 HRS) Nasopharyngeal Nasopharyngeal Swab     Status: None   Collection Time: 12/11/20 10:52 AM   Specimen: Nasopharyngeal Swab  Result Value Ref Range Status   SARS Coronavirus 2 NEGATIVE NEGATIVE Final    Comment: (NOTE) SARS-CoV-2 target nucleic acids are NOT DETECTED.  The SARS-CoV-2 RNA is generally detectable in upper and lower respiratory specimens during the acute phase of infection. Negative results do not preclude SARS-CoV-2 infection, do not rule out co-infections with other pathogens, and should not be used as the sole basis for treatment or other patient management decisions. Negative results must be combined with clinical observations, patient history, and epidemiological information. The expected result is Negative.  Fact Sheet for Patients: SugarRoll.be  Fact Sheet for Healthcare Providers: https://www.woods-mathews.com/  This test is not yet approved or cleared by the [place] FDA and  has been authorized for detection and/or diagnosis of SARS-CoV-2 by FDA under an Emergency Use Authorization (EUA). This EUA will remain  in effect (meaning this test can be used) for the duration of the COVID-19 declaration under Se ction 564(b)(1) of the Act, 21 U.S.C. section 360bbb-3(b)(1), unless the authorization is terminated or revoked sooner.  Performed at [facility], [place] [address] [place],  [place] [address]   Culture, blood (routine x 2)     Status: None   Collection Time: 12/11/20  2:21 PM   Specimen: BLOOD  Result Value Ref Range Status   Specimen Description BLOOD SITE NOT SPECIFIED  Final   Special Requests   Final    BOTTLES DRAWN AEROBIC AND ANAEROBIC Blood Culture adequate volume   Culture   Final    NO GROWTH 5 DAYS  Performed at [facility], [place] [address] [place], [place] [address]    Report Status 12/16/2020 FINAL  Final  Aerobic/Anaerobic Culture (surgical/deep wound)     Status: None (Preliminary result)   Collection Time: 12/15/20  3:39 PM   Specimen: Abscess  Result Value Ref Range Status   Specimen Description ABSCESS ABDOMEN  Final   Special Requests RLQ  Final   Gram Stain   Final    ABUNDANT WBC PRESENT, PREDOMINANTLY PMN NO ORGANISMS SEEN    Culture   Final    CULTURE REINCUBATED FOR BETTER GROWTH Performed at [facility], [place] [address], [place], [place] [address]    Report Status PENDING  Incomplete      Radiology Studies: CT ABDOMEN PELVIS W CONTRAST  Result Date: 12/14/2020 CLINICAL DATA:  Diverticulitis. EXAM: CT ABDOMEN AND PELVIS WITH CONTRAST TECHNIQUE: Multidetector CT imaging of the abdomen and pelvis was performed using the standard protocol following bolus administration of intravenous contrast. CONTRAST:  178mL OMNIPAQUE IOHEXOL 300 MG/ML  SOLN COMPARISON:  12/11/2020 FINDINGS: Lower chest: Trace right pleural effusion. Hepatobiliary: Cyst in posterior right hepatic lobe measures 6 mm, image 27/3. The gallbladder is unremarkable. No biliary ductal dilatation. Pancreas: Unremarkable. No pancreatic ductal dilatation or surrounding inflammatory changes. Spleen: Normal in size without focal abnormality. Adrenals/Urinary Tract: Normal appearance of the adrenal glands. No kidney mass or hydronephrosis identified bilaterally. Urinary bladder is unremarkable. Stomach/Bowel: Stomach appears normal. No pathologic dilatation of the large  or small bowel loops. Complex inflammatory mass within the right lower quadrant of the abdomen is again noted. This measures 5.7 x 5.8 by 4.5 cm, image 64/3. Previously 5.5 x 4.0 x 4.1 cm. On today's study the mass contains multiple internal areas of loculation this is. The largest loculated component is noted centrally with air-fluid level measuring 3.8 cm, image 64/3. Surrounding soft tissue inflammatory changes including fat stranding and free fluid noted. The mass abuts the medial wall of the cecum and right lateral aspect of the mid sigmoid colon. The adjacent portion of sigmoid colon shows mild inflammatory changes and wall thickening. Vascular/Lymphatic: Aortic atherosclerosis without aneurysm. Multiple borderline enlarged retroperitoneal lymph nodes are identified, likely reactive. These measure up to 9 cm. Prominent bilateral pelvic lymph nodes are also noted without signs of adenopathy. Reproductive: The uterus appears prominent as noted on previous imaging. Corpus luteal cyst noted within the left adnexa measuring 2.6 cm. Other: No significant pneumoperitoneum. Musculoskeletal: Degenerative disc disease identified within the lumbar spine. IMPRESSION: 1. Complex inflammatory mass within the right lower quadrant of the abdomen is again noted. The mass has increased in size from previous exam and now contains multiple internal areas of loculation. Findings are concerning for recurrence abscess formation. The mass abuts the medial wall of the cecum and right lateral aspect of the mid sigmoid colon. The adjacent portion of the sigmoid colon shows mild inflammatory changes and wall thickening. 2. Borderline enlarged retroperitoneal and pelvic lymph nodes are identified, likely reactive. 3. Trace right pleural effusion. 4. Aortic atherosclerosis. Aortic Atherosclerosis (ICD10-I70.0). Electronically Signed   By: Kerby Moors M.D.   On: 12/14/2020 13:43   CT IMAGE GUIDED DRAINAGE BY PERCUTANEOUS  CATHETER  Result Date: 12/15/2020 INDICATION: 50 year old female with recurrent diverticular abscess within the right adnexa. EXAM: CT GUIDED DRAINAGE OF  ABSCESS MEDICATIONS: The patient is currently admitted to the hospital and receiving intravenous antibiotics. The antibiotics were administered within an appropriate time frame prior to the initiation of the procedure. ANESTHESIA/SEDATION:  2 mg IV Versed 100 mcg IV Fentanyl Moderate Sedation Time:  16 minutes The patient was continuously monitored during the procedure by the interventional radiology nurse under my direct supervision. COMPLICATIONS: None immediate. TECHNIQUE: Informed written consent was obtained from the patient after a thorough discussion of the procedural risks, benefits and alternatives. All questions were addressed. Maximal Sterile Barrier Technique was utilized including caps, mask, sterile gowns, sterile gloves, sterile drape, hand hygiene and skin antiseptic. A timeout was performed prior to the initiation of the procedure. PROCEDURE: The right lower quadrant was prepped with chlorhexidine in a sterile fashion, and a sterile drape was applied covering the operative field. A sterile gown and sterile gloves were used for the procedure. Local anesthesia was provided with 1% Lidocaine. Under intermittent CT guidance, an 18 gauge trocar needle was advanced through the right lower quadrant abdominal wall and into the abscess collection. A 0.035 wire was coiled in the collection. The skin tract was dilated to 12 [place]. A Cook 12 [place] all-purpose drainage catheter was advanced over the wire and formed. Aspiration yields approximately 20 mL thick bloody and purulent fluid. Samples were sent for Gram stain and culture. The catheter was gently flushed and connected to JP bulb suction. The catheter was secured to the skin with 0 Prolene suture and an adhesive fixation device. The patient tolerated the procedure well. FINDINGS: Thick purulent  bloody fluid. IMPRESSION: Successful placement of 12 French percutaneous drainage catheter into the recurrent right adnexal diverticular abscess. Electronically Signed   By: Jacqulynn Cadet M.D.   On: 12/15/2020 16:15    Scheduled Meds: . acetaminophen  1,000 mg Oral Q6H  . amLODipine  5 mg Oral Daily  . amoxicillin-clavulanate  1 tablet Oral Q12H  . atorvastatin  10 mg Oral Daily  . citalopram  40 mg Oral Daily  . enoxaparin (LOVENOX) injection  40 mg Subcutaneous Daily  . sodium chloride flush  3 mL Intravenous Q12H   Continuous Infusions: . sodium chloride 75 mL/hr at 12/15/20 2124     LOS: 5 days   Time spent: 25 minutes   Darliss Cheney, MD [facility]  12/16/2020, 10:17 AM   To contact the attending provider between 7A-7P or the covering provider during after hours 7P-7A, please log into the web site [email or web address].

## 2020-12-16 NOTE — Progress Notes (Signed)
Referring Physician(s): Meuth, Blaine Hamper ([facility])  Supervising Physician: Ruthann Cancer  Patient Status:  [facility] - In-pt  Chief Complaint: "Drain tenderness"  Subjective:  History of recurrent diverticular perforation with associated intra-abdominal fluid collection concerning for abscess s/p RLQ drain placement in IR 12/15/2020. Patient awake and alert sitting in bed watching TV. Complains of tenderness at drain insertion site, as expected. RLQ drain site c/d/i.   Allergies: Patient has no known allergies.  Medications: Prior to Admission medications   Medication Sig Start Date End Date Taking? Authorizing Provider  acetaminophen (TYLENOL) 500 MG tablet Take 1,000 mg by mouth every 6 (six) hours as needed for moderate pain.   Yes [provider]  ALPRAZolam (XANAX) 1 MG tablet TAKE 1 TABLET EVERY DAY AS NEEDED FOR ANXIETY Patient taking differently: Take 1 mg by mouth daily as needed for anxiety. 12/03/20  Yes Isaac Bliss, Rayford Halsted, MD  atorvastatin (LIPITOR) 10 MG tablet Take 1 tablet (10 mg total) by mouth daily. 11/06/20  Yes Isaac Bliss, Rayford Halsted, MD  citalopram (CELEXA) 40 MG tablet TAKE 1 TABLET BY MOUTH EVERY DAY Patient taking differently: Take 40 mg by mouth daily. 12/03/20  Yes Isaac Bliss, Rayford Halsted, MD  cyclobenzaprine (FLEXERIL) 10 MG tablet TAKE 1 TABLET BY MOUTH AT BEDTIME AS NEEDED FOR SPASMS Patient taking differently: Take 10 mg by mouth at bedtime as needed for muscle spasms. 12/02/20  Yes Isaac Bliss, Rayford Halsted, MD  ibuprofen (ADVIL) 200 MG tablet Take 600 mg by mouth every 6 (six) hours as needed (pain).   Yes [provider]  valACYclovir (VALTREX) 1000 MG tablet TAKE 1 TABLET BY MOUTH EVERY DAY Patient taking differently: Take 1,000 mg by mouth daily as needed (breakouts). 07/21/20  Yes Isaac Bliss, Rayford Halsted, MD  zolpidem (AMBIEN) 10 MG tablet TAKE 1 TABLET BY MOUTH AT BEDTIME AS NEEDED FOR SLEEP. Patient taking  differently: Take 10 mg by mouth at bedtime as needed for sleep. 12/02/20  Yes Isaac Bliss, Rayford Halsted, MD  ondansetron (ZOFRAN ODT) 4 MG disintegrating tablet Take 1 tablet (4 mg total) by mouth every 8 (eight) hours as needed for nausea or vomiting. Patient not taking: Reported on 12/11/2020 10/28/20   Barrie Folk, PA-C  oxyCODONE (ROXICODONE) 5 MG immediate release tablet Take 1 tablet (5 mg total) by mouth every 4 (four) hours as needed for moderate pain or severe pain. Patient not taking: Reported on 12/11/2020 11/09/20   Saverio Danker, PA-C     Vital Signs: BP 131/89 (BP Location: Left Arm)   Pulse 68   Temp 98 F (36.7 C)   Resp 20   SpO2 100%   Physical Exam Vitals and nursing note reviewed.  Constitutional:      General: She is not in acute distress.    Appearance: Normal appearance.  Pulmonary:     Effort: Pulmonary effort is normal. No respiratory distress.  Abdominal:     Comments: RLQ drain site with mild tenderness and dried blood in dressing, no erythema, drainage, or active bleeding noted; approximately 10-15 cc bloody fluid in suction bulb; drain flushes/aspirates without resistance.  Skin:    General: Skin is warm and dry.  Neurological:     Mental Status: She is alert and oriented to person, place, and time.     Imaging: CT ABDOMEN PELVIS W CONTRAST  Result Date: 12/14/2020 CLINICAL DATA:  Diverticulitis. EXAM: CT ABDOMEN AND PELVIS WITH CONTRAST TECHNIQUE: Multidetector CT imaging of the abdomen and pelvis  was performed using the standard protocol following bolus administration of intravenous contrast. CONTRAST:  153mL OMNIPAQUE IOHEXOL 300 MG/ML  SOLN COMPARISON:  12/11/2020 FINDINGS: Lower chest: Trace right pleural effusion. Hepatobiliary: Cyst in posterior right hepatic lobe measures 6 mm, image 27/3. The gallbladder is unremarkable. No biliary ductal dilatation. Pancreas: Unremarkable. No pancreatic ductal dilatation or surrounding inflammatory  changes. Spleen: Normal in size without focal abnormality. Adrenals/Urinary Tract: Normal appearance of the adrenal glands. No kidney mass or hydronephrosis identified bilaterally. Urinary bladder is unremarkable. Stomach/Bowel: Stomach appears normal. No pathologic dilatation of the large or small bowel loops. Complex inflammatory mass within the right lower quadrant of the abdomen is again noted. This measures 5.7 x 5.8 by 4.5 cm, image 64/3. Previously 5.5 x 4.0 x 4.1 cm. On today's study the mass contains multiple internal areas of loculation this is. The largest loculated component is noted centrally with air-fluid level measuring 3.8 cm, image 64/3. Surrounding soft tissue inflammatory changes including fat stranding and free fluid noted. The mass abuts the medial wall of the cecum and right lateral aspect of the mid sigmoid colon. The adjacent portion of sigmoid colon shows mild inflammatory changes and wall thickening. Vascular/Lymphatic: Aortic atherosclerosis without aneurysm. Multiple borderline enlarged retroperitoneal lymph nodes are identified, likely reactive. These measure up to 9 cm. Prominent bilateral pelvic lymph nodes are also noted without signs of adenopathy. Reproductive: The uterus appears prominent as noted on previous imaging. Corpus luteal cyst noted within the left adnexa measuring 2.6 cm. Other: No significant pneumoperitoneum. Musculoskeletal: Degenerative disc disease identified within the lumbar spine. IMPRESSION: 1. Complex inflammatory mass within the right lower quadrant of the abdomen is again noted. The mass has increased in size from previous exam and now contains multiple internal areas of loculation. Findings are concerning for recurrence abscess formation. The mass abuts the medial wall of the cecum and right lateral aspect of the mid sigmoid colon. The adjacent portion of the sigmoid colon shows mild inflammatory changes and wall thickening. 2. Borderline enlarged  retroperitoneal and pelvic lymph nodes are identified, likely reactive. 3. Trace right pleural effusion. 4. Aortic atherosclerosis. Aortic Atherosclerosis (ICD10-I70.0). Electronically Signed   By: Kerby Moors M.D.   On: 12/14/2020 13:43   CT IMAGE GUIDED DRAINAGE BY PERCUTANEOUS CATHETER  Result Date: 12/15/2020 INDICATION: 50 year old female with recurrent diverticular abscess within the right adnexa. EXAM: CT GUIDED DRAINAGE OF  ABSCESS MEDICATIONS: The patient is currently admitted to the hospital and receiving intravenous antibiotics. The antibiotics were administered within an appropriate time frame prior to the initiation of the procedure. ANESTHESIA/SEDATION: 2 mg IV Versed 100 mcg IV Fentanyl Moderate Sedation Time:  16 minutes The patient was continuously monitored during the procedure by the interventional radiology nurse under my direct supervision. COMPLICATIONS: None immediate. TECHNIQUE: Informed written consent was obtained from the patient after a thorough discussion of the procedural risks, benefits and alternatives. All questions were addressed. Maximal Sterile Barrier Technique was utilized including caps, mask, sterile gowns, sterile gloves, sterile drape, hand hygiene and skin antiseptic. A timeout was performed prior to the initiation of the procedure. PROCEDURE: The right lower quadrant was prepped with chlorhexidine in a sterile fashion, and a sterile drape was applied covering the operative field. A sterile gown and sterile gloves were used for the procedure. Local anesthesia was provided with 1% Lidocaine. Under intermittent CT guidance, an 18 gauge trocar needle was advanced through the right lower quadrant abdominal wall and into the abscess collection. A 0.035 wire was coiled  in the collection. The skin tract was dilated to 12 [place]. A Cook 12 [place] all-purpose drainage catheter was advanced over the wire and formed. Aspiration yields approximately 20 mL thick bloody and  purulent fluid. Samples were sent for Gram stain and culture. The catheter was gently flushed and connected to JP bulb suction. The catheter was secured to the skin with 0 Prolene suture and an adhesive fixation device. The patient tolerated the procedure well. FINDINGS: Thick purulent bloody fluid. IMPRESSION: Successful placement of 12 French percutaneous drainage catheter into the recurrent right adnexal diverticular abscess. Electronically Signed   By: Jacqulynn Cadet M.D.   On: 12/15/2020 16:15    Labs:  CBC: Recent Labs    12/13/20 0108 12/14/20 0102 12/15/20 0255 12/16/20 0107  WBC 14.4* 11.6* 7.8 8.8  HGB 10.8* 10.9* 10.8* 11.3*  HCT 32.6* 32.9* 32.1* 34.6*  PLT 242 267 289 300    COAGS: Recent Labs    11/07/20 0909 12/15/20 0255  INR 1.1 1.1    BMP: Recent Labs    12/13/20 0108 12/14/20 0102 12/15/20 0255 12/16/20 0107  NA 134* 136 137 137  K 3.3* 3.7 3.3* 3.7  CL 102 102 102 100  CO2 22 25 26 27   GLUCOSE 84 90 86 112*  BUN 5* <5* <5* <5*  CALCIUM 7.9* 8.3* 8.3* 8.2*  CREATININE 1.03* 0.95 1.00 0.88  GFRNONAA >60 >60 >60 >60    LIVER FUNCTION TESTS: Recent Labs    10/28/20 1700 11/06/20 1807 12/10/20 1713 12/14/20 0102  BILITOT 0.6 0.3 1.2 0.8  AST 12* 10* 14* 8*  ALT 11 8 13 9   ALKPHOS 96 96 100 91  PROT 8.1 7.9 7.6 6.5  ALBUMIN 3.9 3.4* 3.9 2.8*    Assessment and Plan:  History of recurrent diverticular perforation with associated intra-abdominal fluid collection concerning for abscess s/p RLQ drain placement in IR 12/15/2020. RLQ drain stable with approximately 10-15 cc bloody fluid in suction bulb. Continue current drain management- continue with Qshift flushes/monitor of output. Please ensure to document drain output in I&Os. Plan for repeat CT/possible drain injection when output <10 cc/day (assess for possible removal). Further plans per [facility]- appreciate and agree with management. IR to follow.   Electronically Signed: Earley Abide, PA-C 12/16/2020, 10:47 AM   I spent a total of 25 Minutes at the the patient's bedside AND on the patient's hospital floor or unit, greater than 50% of which was counseling/coordinating care for intra-abdominal fluid collection s/p RLQ drain placement.

## 2020-12-16 NOTE — Progress Notes (Signed)
Subjective: Drain placed yesterday. Reports pain this morning. Afebrile, vitals stable.  Objective: Vital signs in last 24 hours: Temp:  [97.4 F (36.3 C)-98.7 F (37.1 C)] 98 F (36.7 C) (01/12 0417) Pulse Rate:  [68-89] 68 (01/12 0417) Resp:  [13-22] 20 (01/12 0417) BP: (131-164)/(89-103) 131/89 (01/12 0417) SpO2:  [97 %-100 %] 100 % (01/12 0417) Last BM Date: 12/14/20  Intake/Output from previous day: 01/11 0701 - 01/12 0700 In: 4317.6 [P.O.:720; I.V.:3273; IV Piggyback:321.7] Out: 2 [Urine:2] Intake/Output this shift: No intake/output data recorded.  PE: General: resting comfortably, NAD Neuro: alert and oriented, no focal deficits Resp: normal work of breathing on room air Abdomen: soft, nondistended, minimally tender. RLQ drain with serous fluid in bulb. Extremities: warm and well-perfused  Lab Results:  Recent Labs    12/15/20 0255 12/16/20 0107  WBC 7.8 8.8  HGB 10.8* 11.3*  HCT 32.1* 34.6*  PLT 289 300   BMET Recent Labs    12/15/20 0255 12/16/20 0107  NA 137 137  K 3.3* 3.7  CL 102 100  CO2 26 27  GLUCOSE 86 112*  BUN <5* <5*  CREATININE 1.00 0.88  CALCIUM 8.3* 8.2*   PT/INR Recent Labs    12/15/20 0255  LABPROT 14.0  INR 1.1   CMP     Component Value Date/Time   NA 137 12/16/2020 0107   NA 141 10/17/2018 0812   K 3.7 12/16/2020 0107   CL 100 12/16/2020 0107   CO2 27 12/16/2020 0107   GLUCOSE 112 (H) 12/16/2020 0107   BUN <5 (L) 12/16/2020 0107   BUN 10 10/17/2018 0812   CREATININE 0.88 12/16/2020 0107   CALCIUM 8.2 (L) 12/16/2020 0107   PROT 6.5 12/14/2020 0102   PROT 6.7 10/17/2018 0812   ALBUMIN 2.8 (L) 12/14/2020 0102   ALBUMIN 4.2 10/17/2018 0812   AST 8 (L) 12/14/2020 0102   ALT 9 12/14/2020 0102   ALKPHOS 91 12/14/2020 0102   BILITOT 0.8 12/14/2020 0102   BILITOT 0.2 10/17/2018 0812   GFRNONAA >60 12/16/2020 0107   GFRAA 82 10/17/2018 0812   Lipase     Component Value Date/Time   LIPASE 28 12/10/2020  1713       Studies/Results: CT ABDOMEN PELVIS W CONTRAST  Result Date: 12/14/2020 CLINICAL DATA:  Diverticulitis. EXAM: CT ABDOMEN AND PELVIS WITH CONTRAST TECHNIQUE: Multidetector CT imaging of the abdomen and pelvis was performed using the standard protocol following bolus administration of intravenous contrast. CONTRAST:  175mL OMNIPAQUE IOHEXOL 300 MG/ML  SOLN COMPARISON:  12/11/2020 FINDINGS: Lower chest: Trace right pleural effusion. Hepatobiliary: Cyst in posterior right hepatic lobe measures 6 mm, image 27/3. The gallbladder is unremarkable. No biliary ductal dilatation. Pancreas: Unremarkable. No pancreatic ductal dilatation or surrounding inflammatory changes. Spleen: Normal in size without focal abnormality. Adrenals/Urinary Tract: Normal appearance of the adrenal glands. No kidney mass or hydronephrosis identified bilaterally. Urinary bladder is unremarkable. Stomach/Bowel: Stomach appears normal. No pathologic dilatation of the large or small bowel loops. Complex inflammatory mass within the right lower quadrant of the abdomen is again noted. This measures 5.7 x 5.8 by 4.5 cm, image 64/3. Previously 5.5 x 4.0 x 4.1 cm. On today's study the mass contains multiple internal areas of loculation this is. The largest loculated component is noted centrally with air-fluid level measuring 3.8 cm, image 64/3. Surrounding soft tissue inflammatory changes including fat stranding and free fluid noted. The mass abuts the medial wall of the cecum and right lateral  aspect of the mid sigmoid colon. The adjacent portion of sigmoid colon shows mild inflammatory changes and wall thickening. Vascular/Lymphatic: Aortic atherosclerosis without aneurysm. Multiple borderline enlarged retroperitoneal lymph nodes are identified, likely reactive. These measure up to 9 cm. Prominent bilateral pelvic lymph nodes are also noted without signs of adenopathy. Reproductive: The uterus appears prominent as noted on previous  imaging. Corpus luteal cyst noted within the left adnexa measuring 2.6 cm. Other: No significant pneumoperitoneum. Musculoskeletal: Degenerative disc disease identified within the lumbar spine. IMPRESSION: 1. Complex inflammatory mass within the right lower quadrant of the abdomen is again noted. The mass has increased in size from previous exam and now contains multiple internal areas of loculation. Findings are concerning for recurrence abscess formation. The mass abuts the medial wall of the cecum and right lateral aspect of the mid sigmoid colon. The adjacent portion of the sigmoid colon shows mild inflammatory changes and wall thickening. 2. Borderline enlarged retroperitoneal and pelvic lymph nodes are identified, likely reactive. 3. Trace right pleural effusion. 4. Aortic atherosclerosis. Aortic Atherosclerosis (ICD10-I70.0). Electronically Signed   By: Kerby Moors M.D.   On: 12/14/2020 13:43   CT IMAGE GUIDED DRAINAGE BY PERCUTANEOUS CATHETER  Result Date: 12/15/2020 INDICATION: 50 year old female with recurrent diverticular abscess within the right adnexa. EXAM: CT GUIDED DRAINAGE OF  ABSCESS MEDICATIONS: The patient is currently admitted to the hospital and receiving intravenous antibiotics. The antibiotics were administered within an appropriate time frame prior to the initiation of the procedure. ANESTHESIA/SEDATION: 2 mg IV Versed 100 mcg IV Fentanyl Moderate Sedation Time:  16 minutes The patient was continuously monitored during the procedure by the interventional radiology nurse under my direct supervision. COMPLICATIONS: None immediate. TECHNIQUE: Informed written consent was obtained from the patient after a thorough discussion of the procedural risks, benefits and alternatives. All questions were addressed. Maximal Sterile Barrier Technique was utilized including caps, mask, sterile gowns, sterile gloves, sterile drape, hand hygiene and skin antiseptic. A timeout was performed prior to the  initiation of the procedure. PROCEDURE: The right lower quadrant was prepped with chlorhexidine in a sterile fashion, and a sterile drape was applied covering the operative field. A sterile gown and sterile gloves were used for the procedure. Local anesthesia was provided with 1% Lidocaine. Under intermittent CT guidance, an 18 gauge trocar needle was advanced through the right lower quadrant abdominal wall and into the abscess collection. A 0.035 wire was coiled in the collection. The skin tract was dilated to 12 [place]. A Cook 12 [place] all-purpose drainage catheter was advanced over the wire and formed. Aspiration yields approximately 20 mL thick bloody and purulent fluid. Samples were sent for Gram stain and culture. The catheter was gently flushed and connected to JP bulb suction. The catheter was secured to the skin with 0 Prolene suture and an adhesive fixation device. The patient tolerated the procedure well. FINDINGS: Thick purulent bloody fluid. IMPRESSION: Successful placement of 12 French percutaneous drainage catheter into the recurrent right adnexal diverticular abscess. Electronically Signed   By: Jacqulynn Cadet M.D.   On: 12/15/2020 16:15    Anti-infectives: Anti-infectives (From admission, onward)   Start     Dose/Rate Route Frequency Ordered Stop   12/11/20 1400  piperacillin-tazobactam (ZOSYN) IVPB 3.375 g        3.375 g 12.5 mL/hr over 240 Minutes Intravenous Every 8 hours 12/11/20 1241     12/11/20 1300  piperacillin-tazobactam (ZOSYN) IVPB 3.375 g  Status:  Discontinued  3.375 g 100 mL/hr over 30 Minutes Intravenous Every 6 hours 12/11/20 1223 12/11/20 1241   12/11/20 0700  piperacillin-tazobactam (ZOSYN) IVPB 3.375 g        3.375 g 100 mL/hr over 30 Minutes Intravenous  Once 12/11/20 0651 12/11/20 0829       Assessment/Plan Hyperlipidemia Chronic back pain Depression Anxiety Hx PID  H/O RLQ abscess, ? Secondary to diverticulitis vs TOA (felt more likely to be  tics in origin), now with recurrent abscess -WBC remains normal -Percutaneous drain placed yesterday -Soft diet -mobilize/pulm toilet -Transition to oral Augmentin -Remain inpatient today for pain control. Tentative discharge tomorrow pending improvement in symptoms and if tolerating PO.    LOS: 5 days    Michaelle Birks, MD [facility], Hepatobiliary and Pancreatic Surgery 12/16/20 8:29 AM

## 2020-12-16 NOTE — Plan of Care (Signed)

## 2020-12-17 ENCOUNTER — Other Ambulatory Visit (HOSPITAL_COMMUNITY): Payer: Self-pay | Admitting: General Surgery

## 2020-12-17 MED ORDER — GABAPENTIN 100 MG PO CAPS: 100.0000 mg | ORAL_CAPSULE | Freq: Two times a day (BID) | ORAL | Status: DC

## 2020-12-17 MED ORDER — OXYCODONE HCL 5 MG PO TABS: 5.0000 mg | ORAL_TABLET | ORAL | 0 refills | Status: DC | PRN

## 2020-12-17 MED ORDER — FLUCONAZOLE 100 MG PO TABS
200.0000 mg | ORAL_TABLET | Freq: Once | ORAL | Status: AC
  Administered 2020-12-17: 200 mg via ORAL
  Filled 2020-12-17: qty 2

## 2020-12-17 MED ORDER — IBUPROFEN 200 MG PO TABS: 600.0000 mg | ORAL_TABLET | Freq: Three times a day (TID) | ORAL | 0 refills | Status: AC | PRN

## 2020-12-17 MED ORDER — AMOXICILLIN-POT CLAVULANATE 875-125 MG PO TABS: 1.0000 | ORAL_TABLET | Freq: Two times a day (BID) | ORAL | 0 refills | Status: DC

## 2020-12-17 MED ORDER — SODIUM CHLORIDE 0.9% FLUSH
5.0000 mL | Freq: Three times a day (TID) | INTRAVENOUS | Status: DC
  Administered 2020-12-17: 5 mL

## 2020-12-17 MED ORDER — GABAPENTIN 100 MG PO CAPS
100.0000 mg | ORAL_CAPSULE | Freq: Two times a day (BID) | ORAL | Status: DC
  Administered 2020-12-17: 100 mg via ORAL
  Filled 2020-12-17: qty 1

## 2020-12-17 MED ORDER — METHOCARBAMOL 500 MG PO TABS: 1000.0000 mg | ORAL_TABLET | Freq: Three times a day (TID) | ORAL | Status: DC

## 2020-12-17 MED ORDER — HYDROMORPHONE HCL 1 MG/ML IJ SOLN: 0.5000 mg | INTRAMUSCULAR | Status: DC | PRN

## 2020-12-17 MED ORDER — METHOCARBAMOL 500 MG PO TABS: 1000.0000 mg | ORAL_TABLET | Freq: Three times a day (TID) | ORAL | 0 refills | Status: DC

## 2020-12-17 MED ORDER — KETOROLAC TROMETHAMINE 30 MG/ML IJ SOLN
30.0000 mg | Freq: Three times a day (TID) | INTRAMUSCULAR | Status: DC
  Administered 2020-12-17 (×2): 30 mg via INTRAVENOUS
  Filled 2020-12-17 (×2): qty 1

## 2020-12-17 MED ORDER — METHOCARBAMOL 500 MG PO TABS
1000.0000 mg | ORAL_TABLET | Freq: Three times a day (TID) | ORAL | Status: DC
  Administered 2020-12-17 (×3): 1000 mg via ORAL
  Filled 2020-12-17 (×3): qty 2

## 2020-12-17 MED ORDER — GABAPENTIN 100 MG PO CAPS: 100.0000 mg | ORAL_CAPSULE | Freq: Two times a day (BID) | ORAL | 1 refills | Status: DC | PRN

## 2020-12-17 MED ORDER — AMLODIPINE BESYLATE 5 MG PO TABS: 5.0000 mg | ORAL_TABLET | Freq: Every day | ORAL | 0 refills | Status: DC

## 2020-12-17 MED FILL — AMLODIPINE BESYLATE 5 MG TA: 5 | 30 days supply | Qty: 30 | Fill #0

## 2020-12-17 MED FILL — METHOCARBAMOL 500 MG TABS: 500 | 10 days supply | Qty: 50 | Fill #0

## 2020-12-17 MED FILL — oxyCODONE HCL 5 MG TABS: 5 | 7 days supply | Qty: 25 | Fill #0

## 2020-12-17 MED FILL — GABAPENTIN 100 MG CAPSULE: 100 | 10 days supply | Qty: 20 | Fill #0

## 2020-12-17 MED FILL — AMOX-CLAV 875-125 MG TABLET: 875-125 | 6 days supply | Qty: 12 | Fill #0

## 2020-12-17 NOTE — Discharge Summary (Signed)
Patient ID: Barbara Yates [id number] 02-Nov-1971 50 y.o.  Admit date: 12/10/2020 Discharge date: 12/17/2020  Admitting Diagnosis: Abdominal pain with RLQ phlegmon N/V/D HLD Hypokalemia Anxiety/depression  Discharge Diagnosis Patient Active Problem List   Diagnosis Date Noted  . Diverticulitis of colon 12/12/2020  . Sepsis ([place]) 12/11/2020  . Phlegmon 12/11/2020  . Hypokalemia 12/11/2020  . TOA (tubo-ovarian abscess) 11/06/2020  . Pelvic abscess in female 11/06/2020  . Hyperlipemia 01/03/2020  . Genital herpes 03/27/2019  . Depression 01/11/2017  . Insomnia 01/11/2017  . Morbid obesity due to excess calories ([place]) 01/07/2016  . Vitamin D deficiency 03/10/2015  . Myofascial pain 03/04/2015  . Chronic back pain 03/04/2015  . Anxiety 10/02/2013  . Fibromyalgia 05/27/2013  HTN Diverticulitis of colon with abscess, s/p IR perc drain  Consultants General surgery hospitalist  Reason for Admission: Barbara Yates is a 50 y.o. female with medical history significant of hyperlipidemia, diverticulitis, chronic back pain, depression, and anxiety presents with complaints of vomiting, diarrhea, and stomach pain.  Symptoms initially started 2 days ago with a mild pain in the right lower quadrant of her abdomen.  However, yesterday symptoms worsened to the point where she developed nausea vomiting, and diarrhea.  Emesis and diarrhea were both nonbloody in appearance.  Abdominal pain became severe pain in the right lower quadrant of her abdomen.  She tried taking Tylenol at home, but was unable to keep it down.  Symptoms similar to when patient was admitted into the hospital last month for perforated colonic diverticulitis with 8 cm abscess in the right lower quadrant vs. tubo-ovarian abscess.  She is treated with IV antibiotics and IR placed a drain.   Patient had followed up with Dr. Nadeen Landau of surgery in the outpatient setting and reports that the plan was to have resection  of that part of the colon sometime in March or April.  This was in order to allow the patient to have a colonoscopy.  ED Course: Upon admission into the emergency department patient was seen to be afebrile, heart rate 72-1 31, respirations 16-22, blood pressures elevated up to 163/97, and O2 saturations maintained.  Labs from 1/6 significant for WBC 21.4, potassium 3.4, lipase 28, and lactic acid 1.1.  Urinalysis was negative for any acute abnormalities.  CT scan of the abdomen and pelvis with contrast revealed inflammatory mass in the right lower quadrant consistent with peridiverticular phlegmon with normal-appearing appendix.  Patient was given 1 L of lactated Ringers, Zosyn, antiemetics, and pain medication IV.  TRH called to admit.  Procedures Percutaneous abscess drain placement by IR on 12/15/20  Hospital Course:  The patient was admitted and started on IV zosyn.  Her WBC trended down.  She was initially NPO for several days.  On HD4, he continued to have significant RLQ abdominal pain.  A repeat CT scan was completed which revealed a recurrently RLQ abscess.  IR was consulted and evaluated for drain placement.  This was completed on the following day.  She tolerated this procedure well.  Her diet was able to be advanced as tolerated, but she had pain control issues in combo from her drain as well as RLQ process.  She had her meds adjusted to scheduled tylenol, toradol, robaxin, low dose gabapentin, and prn oxy.  This helped significantly.  She will continue these upon discharge with ibuprofen instead of toradol.  She will have follow up with Dr. Dema Severin arranged as well as IR in their drain clinic.  The  patient was also noted to have some elevated BPs.  It was initially felt to be from pain, but these persisted.  The medical team placed her on low dose amlodipine at 5mg  daily.  We will have her follow up with her primary medical doctor as an outpatient for further management or adjustment of this as  needed.  Physical Exam: See progress note from today  Allergies as of 12/17/2020   No Known Allergies     Medication List    STOP taking these medications   cyclobenzaprine 10 MG tablet Commonly known as: FLEXERIL     TAKE these medications   acetaminophen 500 MG tablet Commonly known as: TYLENOL Take 1,000 mg by mouth every 6 (six) hours as needed for moderate pain.   ALPRAZolam 1 MG tablet Commonly known as: XANAX TAKE 1 TABLET EVERY DAY AS NEEDED FOR ANXIETY What changed:   how much to take  how to take this  when to take this  reasons to take this  additional instructions   amLODipine 5 MG tablet Commonly known as: NORVASC Take 1 tablet (5 mg total) by mouth daily. Start taking on: December 18, 2020   amoxicillin-clavulanate 875-125 MG tablet Commonly known as: AUGMENTIN Take 1 tablet by mouth every 12 (twelve) hours for 6 days.   atorvastatin 10 MG tablet Commonly known as: LIPITOR Take 1 tablet (10 mg total) by mouth daily.   citalopram 40 MG tablet Commonly known as: CELEXA TAKE 1 TABLET BY MOUTH EVERY DAY   gabapentin 100 MG capsule Commonly known as: NEURONTIN Take 1 capsule (100 mg total) by mouth 2 (two) times daily as needed.   ibuprofen 200 MG tablet Commonly known as: ADVIL Take 3 tablets (600 mg total) by mouth every 8 (eight) hours as needed (pain). What changed: when to take this   methocarbamol 500 MG tablet Commonly known as: ROBAXIN Take 2 tablets (1,000 mg total) by mouth every 8 (eight) hours.   ondansetron 4 MG disintegrating tablet Commonly known as: Zofran ODT Take 1 tablet (4 mg total) by mouth every 8 (eight) hours as needed for nausea or vomiting.   oxyCODONE 5 MG immediate release tablet Commonly known as: Oxy IR/ROXICODONE Take 1-2 tablets (5-10 mg total) by mouth every 4 (four) hours as needed for severe pain or breakthrough pain. What changed:   how much to take  reasons to take this   valACYclovir 1000 MG  tablet Commonly known as: VALTREX TAKE 1 TABLET BY MOUTH EVERY DAY What changed:   how much to take  how to take this  when to take this  reasons to take this  additional instructions   zolpidem 10 MG tablet Commonly known as: AMBIEN TAKE 1 TABLET BY MOUTH AT BEDTIME AS NEEDED FOR SLEEP. What changed:   reasons to take this  additional instructions         Follow-up Information    Ileana Roup, MD Follow up in 3 week(s).   Specialty: General Surgery Contact information: [place] [address] [phone]        Isaac Bliss, Rayford Halsted, MD Follow up in 2 week(s).   Specialty: Internal Medicine Why: follow up for your blood pressure Contact information: [place] [address] [phone]        Criselda Peaches, MD Follow up in 1 week(s).   Specialties: Interventional Radiology, Radiology Why: their office will call you with follow up appointment date and time Contact  information: [address] [place] [address] [phone]               Signed: Saverio Danker, [facility] Surgery 12/17/2020, 3:50 PM Please see Amion for pager number during day hours 7:00am-4:30pm, 7-11:30am on Weekends

## 2020-12-17 NOTE — Progress Notes (Signed)
Subjective: Still having a lot of pain this morning from her drain.  Not controlled with current pain regimen.  Tolerating diet.  Had a small amount of diarrhea yesterday.  Objective: Vital signs in last 24 hours: Temp:  [97.9 F (36.6 C)-98.7 F (37.1 C)] 98.5 F (36.9 C) (01/13 0521) Pulse Rate:  [70-75] 75 (01/13 0521) Resp:  [18-20] 20 (01/13 0521) BP: (143-156)/(99-101) 143/100 (01/13 0521) SpO2:  [98 %-100 %] 98 % (01/13 0521) Last BM Date: 12/16/20  Intake/Output from previous day: 01/12 0701 - 01/13 0700 In: 660 [P.O.:660] Out: 10 [Drains:10] Intake/Output this shift: No intake/output data recorded.  PE: Heart: regular Lungs: CTAB Abd: soft, very tender still in RLQ especially around the drain, drain with serosang output.  +BS, ND  Lab Results:  Recent Labs    12/15/20 0255 12/16/20 0107  WBC 7.8 8.8  HGB 10.8* 11.3*  HCT 32.1* 34.6*  PLT 289 300   BMET Recent Labs    12/15/20 0255 12/16/20 0107  NA 137 137  K 3.3* 3.7  CL 102 100  CO2 26 27  GLUCOSE 86 112*  BUN <5* <5*  CREATININE 1.00 0.88  CALCIUM 8.3* 8.2*   PT/INR Recent Labs    12/15/20 0255  LABPROT 14.0  INR 1.1   CMP     Component Value Date/Time   NA 137 12/16/2020 0107   NA 141 10/17/2018 0812   K 3.7 12/16/2020 0107   CL 100 12/16/2020 0107   CO2 27 12/16/2020 0107   GLUCOSE 112 (H) 12/16/2020 0107   BUN <5 (L) 12/16/2020 0107   BUN 10 10/17/2018 0812   CREATININE 0.88 12/16/2020 0107   CALCIUM 8.2 (L) 12/16/2020 0107   PROT 6.5 12/14/2020 0102   PROT 6.7 10/17/2018 0812   ALBUMIN 2.8 (L) 12/14/2020 0102   ALBUMIN 4.2 10/17/2018 0812   AST 8 (L) 12/14/2020 0102   ALT 9 12/14/2020 0102   ALKPHOS 91 12/14/2020 0102   BILITOT 0.8 12/14/2020 0102   BILITOT 0.2 10/17/2018 0812   GFRNONAA >60 12/16/2020 0107   GFRAA 82 10/17/2018 0812   Lipase     Component Value Date/Time   LIPASE 28 12/10/2020 1713       Studies/Results: CT IMAGE GUIDED DRAINAGE BY  PERCUTANEOUS CATHETER  Result Date: 12/15/2020 INDICATION: 50 year old female with recurrent diverticular abscess within the right adnexa. EXAM: CT GUIDED DRAINAGE OF  ABSCESS MEDICATIONS: The patient is currently admitted to the hospital and receiving intravenous antibiotics. The antibiotics were administered within an appropriate time frame prior to the initiation of the procedure. ANESTHESIA/SEDATION: 2 mg IV Versed 100 mcg IV Fentanyl Moderate Sedation Time:  16 minutes The patient was continuously monitored during the procedure by the interventional radiology nurse under my direct supervision. COMPLICATIONS: None immediate. TECHNIQUE: Informed written consent was obtained from the patient after a thorough discussion of the procedural risks, benefits and alternatives. All questions were addressed. Maximal Sterile Barrier Technique was utilized including caps, mask, sterile gowns, sterile gloves, sterile drape, hand hygiene and skin antiseptic. A timeout was performed prior to the initiation of the procedure. PROCEDURE: The right lower quadrant was prepped with chlorhexidine in a sterile fashion, and a sterile drape was applied covering the operative field. A sterile gown and sterile gloves were used for the procedure. Local anesthesia was provided with 1% Lidocaine. Under intermittent CT guidance, an 18 gauge trocar needle was advanced through the right lower quadrant abdominal wall and into the  abscess collection. A 0.035 wire was coiled in the collection. The skin tract was dilated to 12 [place]. A Cook 12 [place] all-purpose drainage catheter was advanced over the wire and formed. Aspiration yields approximately 20 mL thick bloody and purulent fluid. Samples were sent for Gram stain and culture. The catheter was gently flushed and connected to JP bulb suction. The catheter was secured to the skin with 0 Prolene suture and an adhesive fixation device. The patient tolerated the procedure well. FINDINGS: Thick  purulent bloody fluid. IMPRESSION: Successful placement of 12 French percutaneous drainage catheter into the recurrent right adnexal diverticular abscess. Electronically Signed   By: Jacqulynn Cadet M.D.   On: 12/15/2020 16:15    Anti-infectives: Anti-infectives (From admission, onward)   Start     Dose/Rate Route Frequency Ordered Stop   12/16/20 1000  amoxicillin-clavulanate (AUGMENTIN) 875-125 MG per tablet 1 tablet        1 tablet Oral Every 12 hours 12/16/20 0831     12/11/20 1400  piperacillin-tazobactam (ZOSYN) IVPB 3.375 g  Status:  Discontinued        3.375 g 12.5 mL/hr over 240 Minutes Intravenous Every 8 hours 12/11/20 1241 12/16/20 0831   12/11/20 1300  piperacillin-tazobactam (ZOSYN) IVPB 3.375 g  Status:  Discontinued        3.375 g 100 mL/hr over 30 Minutes Intravenous Every 6 hours 12/11/20 1223 12/11/20 1241   12/11/20 0700  piperacillin-tazobactam (ZOSYN) IVPB 3.375 g        3.375 g 100 mL/hr over 30 Minutes Intravenous  Once 12/11/20 0651 12/11/20 0829       Assessment/Plan Hyperlipidemia Chronic back pain Depression Anxiety Hx PID  H/O RLQ abscess, ? Secondary to diverticulitis vs TOA (felt more likely to be tics in origin), now with recurrent abscess -WBC remains normal -Percutaneous drain placed 1/11 -Soft diet -mobilize/pulm toilet -tolerating oral Augmentin -Remain inpatient today for pain control. Tentative discharge later today vs tomorrow.  Made significant adjustments in pain medicine.  Oxy, scheduled tylenol, toradol, low dose gabapentin, robaxin.    LOS: 6 days    Henreitta Cea 7:53 AM 12/17/2020

## 2020-12-17 NOTE — Progress Notes (Signed)
Referring Physician(s): Allen,S  Supervising Physician: Ruel Favors  Patient Status:  [name] - In-pt  Chief Complaint:  Right lower abdominal pain/abscess  Subjective: Pt cont to have some RLQ pain at drain site; no N/V , fever or resp issues   Allergies: Patient has no known allergies.  Medications: Prior to Admission medications   Medication Sig Start Date End Date Taking? Authorizing Provider  acetaminophen (TYLENOL) 500 MG tablet Take 1,000 mg by mouth every 6 (six) hours as needed for moderate pain.   Yes [provider]  ALPRAZolam (XANAX) 1 MG tablet TAKE 1 TABLET EVERY DAY AS NEEDED FOR ANXIETY Patient taking differently: Take 1 mg by mouth daily as needed for anxiety. 12/03/20  Yes Philip Aspen, Limmie Patricia, MD  atorvastatin (LIPITOR) 10 MG tablet Take 1 tablet (10 mg total) by mouth daily. 11/06/20  Yes Philip Aspen, Limmie Patricia, MD  citalopram (CELEXA) 40 MG tablet TAKE 1 TABLET BY MOUTH EVERY DAY Patient taking differently: Take 40 mg by mouth daily. 12/03/20  Yes Philip Aspen, Limmie Patricia, MD  cyclobenzaprine (FLEXERIL) 10 MG tablet TAKE 1 TABLET BY MOUTH AT BEDTIME AS NEEDED FOR SPASMS Patient taking differently: Take 10 mg by mouth at bedtime as needed for muscle spasms. 12/02/20  Yes Philip Aspen, Limmie Patricia, MD  ibuprofen (ADVIL) 200 MG tablet Take 600 mg by mouth every 6 (six) hours as needed (pain).   Yes [provider]  valACYclovir (VALTREX) 1000 MG tablet TAKE 1 TABLET BY MOUTH EVERY DAY Patient taking differently: Take 1,000 mg by mouth daily as needed (breakouts). 07/21/20  Yes Philip Aspen, Limmie Patricia, MD  zolpidem (AMBIEN) 10 MG tablet TAKE 1 TABLET BY MOUTH AT BEDTIME AS NEEDED FOR SLEEP. Patient taking differently: Take 10 mg by mouth at bedtime as needed for sleep. 12/02/20  Yes Philip Aspen, Limmie Patricia, MD  ondansetron (ZOFRAN ODT) 4 MG disintegrating tablet Take 1 tablet (4 mg total) by mouth every 8 (eight) hours as  needed for nausea or vomiting. Patient not taking: Reported on 12/11/2020 10/28/20   [name], PA-C  oxyCODONE (ROXICODONE) 5 MG immediate release tablet Take 1 tablet (5 mg total) by mouth every 4 (four) hours as needed for moderate pain or severe pain. Patient not taking: Reported on 12/11/2020 11/09/20   [name], PA-C     Vital Signs: BP (!) 143/100 (BP Location: Left Arm)   Pulse 75   Temp 98.5 F (36.9 C) (Oral)   Resp 20   SpO2 98%   Physical Exam awake/alert; RLQ drain intact, insertion site with some old blood in region, OP 10-15  cc blood-tinged fluid with tissue fragments, drain flushed without difficulty  Imaging: CT ABDOMEN PELVIS W CONTRAST  Result Date: 12/14/2020 CLINICAL DATA:  Diverticulitis. EXAM: CT ABDOMEN AND PELVIS WITH CONTRAST TECHNIQUE: Multidetector CT imaging of the abdomen and pelvis was performed using the standard protocol following bolus administration of intravenous contrast. CONTRAST:  OMNIPAQUE IOHEXOL 300 MG/ML  SOLN COMPARISON:  12/11/2020 FINDINGS: Lower chest: Trace right pleural effusion. Hepatobiliary: Cyst in posterior right hepatic lobe measures 6 mm, image 27/3. The gallbladder is unremarkable. No biliary ductal dilatation. Pancreas: Unremarkable. No pancreatic ductal dilatation or surrounding inflammatory changes. Spleen: Normal in size without focal abnormality. Adrenals/Urinary Tract: Normal appearance of the adrenal glands. No kidney mass or hydronephrosis identified bilaterally. Urinary bladder is unremarkable. Stomach/Bowel: Stomach appears normal. No pathologic dilatation of the large or small bowel loops. Complex inflammatory mass within the right  lower quadrant of the abdomen is again noted. This measures 5.7 x 5.8 by 4.5 cm, image 64/3. Previously 5.5 x 4.0 x 4.1 cm. On today's study the mass contains multiple internal areas of loculation this is. The largest loculated component is noted centrally with air-fluid level  measuring 3.8 cm, image 64/3. Surrounding soft tissue inflammatory changes including fat stranding and free fluid noted. The mass abuts the medial wall of the cecum and right lateral aspect of the mid sigmoid colon. The adjacent portion of sigmoid colon shows mild inflammatory changes and wall thickening. Vascular/Lymphatic: Aortic atherosclerosis without aneurysm. Multiple borderline enlarged retroperitoneal lymph nodes are identified, likely reactive. These measure up to 9 cm. Prominent bilateral pelvic lymph nodes are also noted without signs of adenopathy. Reproductive: The uterus appears prominent as noted on previous imaging. Corpus luteal cyst noted within the left adnexa measuring 2.6 cm. Other: No significant pneumoperitoneum. Musculoskeletal: Degenerative disc disease identified within the lumbar spine. IMPRESSION: 1. Complex inflammatory mass within the right lower quadrant of the abdomen is again noted. The mass has increased in size from previous exam and now contains multiple internal areas of loculation. Findings are concerning for recurrence abscess formation. The mass abuts the medial wall of the cecum and right lateral aspect of the mid sigmoid colon. The adjacent portion of the sigmoid colon shows mild inflammatory changes and wall thickening. 2. Borderline enlarged retroperitoneal and pelvic lymph nodes are identified, likely reactive. 3. Trace right pleural effusion. 4. Aortic atherosclerosis. Aortic Atherosclerosis (ICD10-I70.0). Electronically Signed   By: Kerby Moors M.D.   On: 12/14/2020 13:43   CT IMAGE GUIDED DRAINAGE BY PERCUTANEOUS CATHETER  Result Date: 12/15/2020 INDICATION: 50 year old female with recurrent diverticular abscess within the right adnexa. EXAM: CT GUIDED DRAINAGE OF  ABSCESS MEDICATIONS: The patient is currently admitted to the hospital and receiving intravenous antibiotics. The antibiotics were administered within an appropriate time frame prior to the initiation  of the procedure. ANESTHESIA/SEDATION: 2 mg IV Versed 100 mcg IV Fentanyl Moderate Sedation Time:  16 minutes The patient was continuously monitored during the procedure by the interventional radiology nurse under my direct supervision. COMPLICATIONS: None immediate. TECHNIQUE: Informed written consent was obtained from the patient after a thorough discussion of the procedural risks, benefits and alternatives. All questions were addressed. Maximal Sterile Barrier Technique was utilized including caps, mask, sterile gowns, sterile gloves, sterile drape, hand hygiene and skin antiseptic. A timeout was performed prior to the initiation of the procedure. PROCEDURE: The right lower quadrant was prepped with chlorhexidine in a sterile fashion, and a sterile drape was applied covering the operative field. A sterile gown and sterile gloves were used for the procedure. Local anesthesia was provided with 1% Lidocaine. Under intermittent CT guidance, an 18 gauge trocar needle was advanced through the right lower quadrant abdominal wall and into the abscess collection. A 0.035 wire was coiled in the collection. The skin tract was dilated to 12 [place]. A Cook 12 [place] all-purpose drainage catheter was advanced over the wire and formed. Aspiration yields approximately 20 mL thick bloody and purulent fluid. Samples were sent for Gram stain and culture. The catheter was gently flushed and connected to JP bulb suction. The catheter was secured to the skin with 0 Prolene suture and an adhesive fixation device. The patient tolerated the procedure well. FINDINGS: Thick purulent bloody fluid. IMPRESSION: Successful placement of 12 French percutaneous drainage catheter into the recurrent right adnexal diverticular abscess. Electronically Signed   By: Dellis Filbert.D.  On: 12/15/2020 16:15    Labs:  CBC: Recent Labs    12/13/20 0108 12/14/20 0102 12/15/20 0255 12/16/20 0107  WBC 14.4* 11.6* 7.8 8.8  HGB 10.8* 10.9*  10.8* 11.3*  HCT 32.6* 32.9* 32.1* 34.6*  PLT 242 267 289 300    COAGS: Recent Labs    11/07/20 0909 12/15/20 0255  INR 1.1 1.1    BMP: Recent Labs    12/13/20 0108 12/14/20 0102 12/15/20 0255 12/16/20 0107  NA 134* 136 137 137  K 3.3* 3.7 3.3* 3.7  CL 102 102 102 100  CO2 22 25 26 27   GLUCOSE 84 90 86 112*  BUN 5* <5* <5* <5*  CALCIUM 7.9* 8.3* 8.3* 8.2*  CREATININE 1.03* 0.95 1.00 0.88  GFRNONAA >60 >60 >60 >60    LIVER FUNCTION TESTS: Recent Labs    10/28/20 1700 11/06/20 1807 12/10/20 1713 12/14/20 0102  BILITOT 0.6 0.3 1.2 0.8  AST 12* 10* 14* 8*  ALT 11 8 13 9   ALKPHOS 96 96 100 91  PROT 8.1 7.9 7.6 6.5  ALBUMIN 3.9 3.4* 3.9 2.8*    Assessment and Plan: Pt with hx recurrent TOA vs diverticular abscess, s/p drain placement 1/11; afebrile; no new labs today; drain fl cx- gm neg rods/pend; cont with drain irrigation/OP monitoring, lab checks; once OP minimal over 3-4 day span obtain f/u imaging along with drain injection; if d/c'd home with drain rec once daily irrigation with 5 cc sterile saline, OP recording and dressing changes every 1-2 days; IR clinic f/u can be scheduled once d/c plans confirmed   Electronically Signed: D. Rowe Robert, PA-C 12/17/2020, 11:20 AM   I spent a total of 15 minutes at the the patient's bedside AND on the patient's hospital floor or unit, greater than 50% of which was counseling/coordinating care for right lower abdominal abscess drain    Patient ID: Barbara Yates, female   DOB: [birth year], 50 y.o.   MRN: [id number]

## 2020-12-17 NOTE — Progress Notes (Signed)
Chart reviewed.  The latest blood pressure reading was done at 5 AM which was elevated however reportedly, patient was in pain and she had not received her morning antihypertensive medication yet.  Hopefully current dose of amlodipine 5 mg will suffice along with controlling the pain which general surgery is doing.  We will review chart again tomorrow to make any final adjustments.

## 2020-12-17 NOTE — Discharge Instructions (Signed)
Flush drain with 5cc of normal saline daily Empty and record drain output daily May shower, but do not submerge in water as long as drain in place      [place] this sheet to all of your post-operative appointments while you have your drains.  Please measure your drains by CC's or ML's.  Make sure you drain and measure your JP Drains 3 times per day.  At the end of each day, add up totals for the left side and add up totals for the right side.    ( 9 am )     ( 3 pm )        ( 9 pm )                Date L  R  L  R  L  R  Total L/R                                                                                                                                                                                         Diverticulitis, low fiber diet for 4 weeks, then high fiber  Diverticulitis is when small pouches in your colon (large intestine) get infected or swollen. This causes pain in the belly (abdomen) and watery poop (diarrhea). These pouches are called diverticula. The pouches form in people who have a condition called diverticulosis. What are the causes? This condition may be caused by poop (stool) that gets trapped in the pouches in your colon. The poop lets germs (bacteria) grow in the pouches. This causes the infection. What increases the risk? You are more likely to get this condition if you have small pouches in your colon. The risk is higher if:  You are overweight or very overweight (obese).  You do not exercise enough.  You drink alcohol.  You smoke or use products with tobacco in them.  You eat a diet that has a lot of red meat such as beef, pork, or lamb.  You eat a diet that does not have enough fiber in it.  You are older than 48 years of age. What are the signs or symptoms?  Pain in the belly. Pain is often on the left side, but it may be in other areas.  Fever and feeling cold.  Feeling like you may vomit.  Vomiting.  Having  cramps.  Feeling full.  Changes to how often you poop.  Blood in your poop. How is this treated? Most cases are treated at home by:  Taking over-the-counter pain medicines.  Following a clear liquid diet.  Taking antibiotic medicines.  Resting.  Very bad cases may need to be treated at a hospital. This may include:  Not eating or drinking.  Taking prescription pain medicine.  Getting antibiotic medicines through an IV tube.  Getting fluid and food through an IV tube.  Having surgery. When you are feeling better, your doctor may tell you to have a test to check your colon (colonoscopy). Follow these instructions at home: Medicines  Take over-the-counter and prescription medicines only as told by your doctor. These include: ? Antibiotics. ? Pain medicines. ? Fiber pills. ? Probiotics. ? Stool softeners.  If you were prescribed an antibiotic medicine, take it as told by your doctor. Do not stop taking the antibiotic even if you start to feel better.  Ask your doctor if the medicine prescribed to you requires you to avoid driving or using machinery. Eating and drinking  Follow a diet as told by your doctor.  When you feel better, your doctor may tell you to change your diet. You may need to eat a lot of fiber. Fiber makes it easier to poop (have a bowel movement). Foods with fiber include: ? Berries. ? Beans. ? Lentils. ? Green vegetables.  Avoid eating red meat.   General instructions  Do not use any products that contain nicotine or tobacco, such as cigarettes, e-cigarettes, and chewing tobacco. If you need help quitting, ask your doctor.  Exercise 3 or more times a week. Try to get 30 minutes each time. Exercise enough to sweat and make your heart beat faster.  Keep all follow-up visits as told by your doctor. This is important. Contact a doctor if:  Your pain does not get better.  You are not pooping like normal. Get help right away if:  Your pain  gets worse.  Your symptoms do not get better.  Your symptoms get worse very fast.  You have a fever.  You vomit more than one time.  You have poop that is: ? Bloody. ? Black. ? Tarry. Summary  This condition happens when small pouches in your colon get infected or swollen.  Take medicines only as told by your doctor.  Follow a diet as told by your doctor.  Keep all follow-up visits as told by your doctor. This is important. This information is not intended to replace advice given to you by your health care provider. Make sure you discuss any questions you have with your health care provider. Document Revised: 09/02/2019 Document Reviewed: 09/02/2019 Elsevier Patient Education  2021 [employer].

## 2020-12-17 NOTE — Discharge Planning (Signed)
Rachelle Hora to be D/C'd  per MD order. Discussed with the patient and all questions fully answered.  VSS, Skin clean, dry and intact without evidence of skin break down, no evidence of skin tears noted.  IV catheter discontinued intact. Site without signs and symptoms of complications. Dressing and pressure applied.  An After Visit Summary was printed and given to the patient. Patient received prescription.  D/c education completed with patient/family including follow up instructions, medication list, d/c activities limitations if indicated, with other d/c instructions as indicated by MD - patient able to verbalize understanding, all questions fully answered.   Patient instructed to return to ED, call 911, or call MD for any changes in condition.   Patient to be escorted via [place], and D/C home via private auto.

## 2020-12-18 ENCOUNTER — Encounter: Payer: Self-pay | Admitting: Gastroenterology

## 2020-12-18 ENCOUNTER — Other Ambulatory Visit: Payer: Self-pay | Admitting: Surgery

## 2020-12-18 DIAGNOSIS — K651 Peritoneal abscess: Secondary | ICD-10-CM

## 2020-12-20 LAB — AEROBIC/ANAEROBIC CULTURE W GRAM STAIN (SURGICAL/DEEP WOUND)

## 2020-12-24 DIAGNOSIS — K5792 Diverticulitis of intestine, part unspecified, without perforation or abscess without bleeding: Secondary | ICD-10-CM | POA: Diagnosis not present

## 2020-12-31 ENCOUNTER — Ambulatory Visit
Admission: RE | Admit: 2020-12-31 | Discharge: 2020-12-31 | Disposition: A | Discharge status: Home / Self Care | Payer: BC Managed Care – PPO | Source: Ambulatory Visit | Attending: Radiology | Admitting: Radiology

## 2020-12-31 ENCOUNTER — Ambulatory Visit
Admission: RE | Admit: 2020-12-31 | Discharge: 2020-12-31 | Disposition: A | Discharge status: Home / Self Care | Payer: BC Managed Care – PPO | Source: Ambulatory Visit | Attending: Surgery | Admitting: Surgery

## 2020-12-31 ENCOUNTER — Other Ambulatory Visit: Payer: Self-pay | Admitting: Surgery

## 2020-12-31 ENCOUNTER — Encounter: Payer: Self-pay | Admitting: Radiology

## 2020-12-31 DIAGNOSIS — K651 Peritoneal abscess: Secondary | ICD-10-CM

## 2020-12-31 DIAGNOSIS — I700 Atherosclerosis of aorta: Secondary | ICD-10-CM | POA: Diagnosis not present

## 2020-12-31 DIAGNOSIS — R600 Localized edema: Secondary | ICD-10-CM | POA: Diagnosis not present

## 2020-12-31 DIAGNOSIS — K429 Umbilical hernia without obstruction or gangrene: Secondary | ICD-10-CM | POA: Diagnosis not present

## 2020-12-31 DIAGNOSIS — Z4682 Encounter for fitting and adjustment of non-vascular catheter: Secondary | ICD-10-CM | POA: Diagnosis not present

## 2020-12-31 HISTORY — PX: IR RADIOLOGIST EVAL & MGMT: IMG5224

## 2020-12-31 MED ORDER — IOPAMIDOL (ISOVUE-300) INJECTION 61%
100.0000 mL | Freq: Once | INTRAVENOUS | Status: AC | PRN
  Administered 2020-12-31: 100 mL via INTRAVENOUS

## 2020-12-31 NOTE — Progress Notes (Signed)
Referring Physician(s): Dr. Dema Severin  Chief Complaint: The patient is seen in follow up today s/p RLQ abscess drain placed 12/18/19 by Dr. Laurence Ferrari  History of present illness:  Barbara Yates, 50 year old female, has a medical history significant for anxiety, depression, migraines and diverticulitis. She had a prior RLQ abscess drain placed by IR 11/07/20 and this was removed 11/17/20 after CT and drain injection showed no abscess and no fistula.  She presented to the hospital 12/11/20 with abdominal pain, nausea and vomiting and was found to have another right lower quadrant gas and fluid collection thought to be diverticular but possibly adnexal in origin. She returned to IR 12/15/20 for an abscess drain placement and she was discharged from the hospital 12/17/20.  She presents to the IR clinic today for a drain evaluation including a CT scan and drain injection. She has been afebrile and is not currently on antibiotics. She reports a good appetite and good energy levels. Daily drain output averages 10-15 ml and she has been flushing the drain once daily with 5 ml of NS.       Past Medical History:  Diagnosis Date  . Anxiety   . Chronic back pain   . Depression   . Diverticulitis   . Hyperlipidemia   . Insomnia   . Migraine headache   . Myofascial pain     Past Surgical History:  Procedure Laterality Date  . CERVICAL SPINE SURGERY    . IR RADIOLOGIST EVAL & MGMT  11/17/2020  . TUBAL LIGATION      Allergies: Patient has no known allergies.  Medications: Prior to Admission medications   Medication Sig Start Date End Date Taking? Authorizing Provider  acetaminophen (TYLENOL) 500 MG tablet Take 1,000 mg by mouth every 6 (six) hours as needed for moderate pain.    [provider]  ALPRAZolam (XANAX) 1 MG tablet TAKE 1 TABLET EVERY DAY AS NEEDED FOR ANXIETY Patient taking differently: Take 1 mg by mouth daily as needed for anxiety. 12/03/20   Isaac Bliss, Rayford Halsted,  MD  amLODipine (NORVASC) 5 MG tablet Take 1 tablet (5 mg total) by mouth daily. 12/18/20   Saverio Danker, PA-C  atorvastatin (LIPITOR) 10 MG tablet Take 1 tablet (10 mg total) by mouth daily. 11/06/20   Isaac Bliss, Rayford Halsted, MD  citalopram (CELEXA) 40 MG tablet TAKE 1 TABLET BY MOUTH EVERY DAY Patient taking differently: Take 40 mg by mouth daily. 12/03/20   Isaac Bliss, Rayford Halsted, MD  gabapentin (NEURONTIN) 100 MG capsule Take 1 capsule (100 mg total) by mouth 2 (two) times daily as needed. 12/17/20   Saverio Danker, PA-C  ibuprofen (ADVIL) 200 MG tablet Take 3 tablets (600 mg total) by mouth every 8 (eight) hours as needed (pain). 12/17/20   Saverio Danker, PA-C  methocarbamol (ROBAXIN) 500 MG tablet Take 2 tablets (1,000 mg total) by mouth every 8 (eight) hours. 12/17/20   Saverio Danker, PA-C  ondansetron (ZOFRAN ODT) 4 MG disintegrating tablet Take 1 tablet (4 mg total) by mouth every 8 (eight) hours as needed for nausea or vomiting. Patient not taking: Reported on 12/11/2020 10/28/20   Barrie Folk, PA-C  oxyCODONE (OXY IR/ROXICODONE) 5 MG immediate release tablet Take 1-2 tablets (5-10 mg total) by mouth every 4 (four) hours as needed for severe pain or breakthrough pain. 12/17/20   Saverio Danker, PA-C  valACYclovir (VALTREX) 1000 MG tablet TAKE 1 TABLET BY MOUTH EVERY DAY Patient taking differently: Take 1,000 mg by mouth  daily as needed (breakouts). 07/21/20   Isaac Bliss, Rayford Halsted, MD  zolpidem (AMBIEN) 10 MG tablet TAKE 1 TABLET BY MOUTH AT BEDTIME AS NEEDED FOR SLEEP. Patient taking differently: Take 10 mg by mouth at bedtime as needed for sleep. 12/02/20   Isaac Bliss, Rayford Halsted, MD     Family History  Problem Relation Age of Onset  . Hypertension Paternal Grandmother   . CVA Paternal Grandmother   . Alzheimer's disease Paternal Grandmother   . Cancer Paternal Grandfather        Pancreatic  . Pancreatic cancer Maternal Grandfather     Social History    Socioeconomic History  . Marital status: Significant Other    Spouse name: Not on file  . Number of children: Not on file  . Years of education: Not on file  . Highest education level: Not on file  Occupational History  . Not on file  Tobacco Use  . Smoking status: Former Smoker    Packs/day: 0.25    Years: 3.00    Pack years: 0.75    Types: Cigarettes    Start date: 08/13/1995    Quit date: 09/27/1998    Years since quitting: 22.2  . Smokeless tobacco: Never Used  . Tobacco comment: quit in 1999  Vaping Use  . Vaping Use: Never used  Substance and Sexual Activity  . Alcohol use: Yes    Alcohol/week: 4.0 standard drinks    Types: 4 Shots of liquor per week  . Drug use: No  . Sexual activity: Yes    Birth control/protection: Surgical  Other Topics Concern  . Not on file  Social History Narrative   ** Merged History Encounter **       Social Determinants of Health   Financial Resource Strain: Not on file  Food Insecurity: Not on file  Transportation Needs: Not on file  Physical Activity: Not on file  Stress: Not on file  Social Connections: Not on file     Vital Signs: BP (!) 140/95   Pulse 68   Temp 98.8 F (37.1 C)   SpO2 97%   Physical Exam Constitutional:      General: She is not in acute distress. Pulmonary:     Effort: Pulmonary effort is normal.  Abdominal:     Palpations: Abdomen is soft.     Tenderness: There is no abdominal tenderness.     Comments: RLQ drain intact with serous fluid in the bulb. Site is clean and dry. Mild discomfort with drain injection.   Skin:    General: Skin is warm and dry.  Neurological:     Mental Status: She is alert and oriented to person, place, and time.     Imaging: No results found.  Labs:  CBC: Recent Labs    12/13/20 0108 12/14/20 0102 12/15/20 0255 12/16/20 0107  WBC 14.4* 11.6* 7.8 8.8  HGB 10.8* 10.9* 10.8* 11.3*  HCT 32.6* 32.9* 32.1* 34.6*  PLT 242 267 289 300    COAGS: Recent Labs     11/07/20 0909 12/15/20 0255  INR 1.1 1.1    BMP: Recent Labs    12/13/20 0108 12/14/20 0102 12/15/20 0255 12/16/20 0107  NA 134* 136 137 137  K 3.3* 3.7 3.3* 3.7  CL 102 102 102 100  CO2 22 25 26 27   GLUCOSE 84 90 86 112*  BUN 5* <5* <5* <5*  CALCIUM 7.9* 8.3* 8.3* 8.2*  CREATININE 1.03* 0.95 1.00 0.88  GFRNONAA >60 >60 >  60 >60    LIVER FUNCTION TESTS: Recent Labs    10/28/20 1700 11/06/20 1807 12/10/20 1713 12/14/20 0102  BILITOT 0.6 0.3 1.2 0.8  AST 12* 10* 14* 8*  ALT 11 8 13 9   ALKPHOS 96 96 100 91  PROT 8.1 7.9 7.6 6.5  ALBUMIN 3.9 3.4* 3.9 2.8*    Assessment:  RLQ diverticular vs adnexal abscess with drain placement 12/15/20:  CT imaging today showed resolution of the abscess and contrast drain injection did not demonstrate a fistula. Given the patient's history with this repeat RLQ abscess Dr. Dema Severin was consulted and the decision was made to leave the drain in for one more week. The patient will discontinue flushing the drain and the drain was switched to a gravity bag. She will return next week for a drain injection only and if a fistula still is not identified the drain will be removed. The patient was in agreement with this plan and she knows she can call the clinic with any questions or concerns.    Signed: Theresa Duty, NP 12/31/2020, 3:00 PM   Please refer to the attestation of this note by Dr. Pascal Lux for management and plan.

## 2021-01-05 LAB — HM MAMMOGRAPHY: HM Mammogram: NORMAL (ref 0–4)

## 2021-01-06 ENCOUNTER — Telehealth: Payer: Self-pay

## 2021-01-06 NOTE — Telephone Encounter (Signed)
OK for direct colonoscopy.  If patient wants to be seen can be set up in clinic, but I believe workup is to rule out a colonic source for abscess. Thanks. GM

## 2021-01-06 NOTE — Telephone Encounter (Signed)
Dr. Rush Landmark  In doing [place] work up it was noted that she has been dealing with pelvic pain since the end of Nov with doctor visits PCP, GYN and CTs.  She also has been to ED x3 with 2 admissions  Her dx are ovarian abscess and on 12/11/20 pelvis abcess with sepsis.  Current she has a percutaneous drain-on 1/27-under the note tab there is a progress note for IR Rad who evaluated the drain with plans to keep it in place.  On 1/7 Dr. Annye English referred her for a colonoscopy - she is scheduled as a direct.  Would you like to proceed as a direct or would you rather have an OV?  Thank you for your time

## 2021-01-07 ENCOUNTER — Encounter: Payer: Self-pay | Admitting: Radiology

## 2021-01-07 ENCOUNTER — Ambulatory Visit: Payer: BC Managed Care – PPO | Admitting: Internal Medicine

## 2021-01-07 ENCOUNTER — Ambulatory Visit
Admission: RE | Admit: 2021-01-07 | Discharge: 2021-01-07 | Disposition: A | Discharge status: Home / Self Care | Payer: BC Managed Care – PPO | Source: Ambulatory Visit | Attending: Surgery | Admitting: Surgery

## 2021-01-07 DIAGNOSIS — Z4682 Encounter for fitting and adjustment of non-vascular catheter: Secondary | ICD-10-CM | POA: Diagnosis not present

## 2021-01-07 DIAGNOSIS — K651 Peritoneal abscess: Secondary | ICD-10-CM

## 2021-01-07 HISTORY — PX: IR RADIOLOGIST EVAL & MGMT: IMG5224

## 2021-01-07 NOTE — Progress Notes (Signed)
Referring Physician(s): White,Christopher M  Chief Complaint: The patient is seen in follow up today s/p RLQ abscess drain placed 12/18/19 by Dr. Laurence Ferrari  History of present illness:  Barbara Yates, 50 year old female, has a medical history significant for anxiety, depression, migraines and diverticulitis. She had a prior RLQ abscess drain placed by IR 11/07/20 and this was removed 11/17/20 after CT and drain injection showed no abscess or fistula.  She presented to the hospital 12/11/20 with abdominal pain, nausea and vomiting and was found to have another right lower quadrant gas/fluid collection thought to be diverticular but possibly adnexal in origin. She returned to IR 12/15/20 for an abscess drain placement and was discharged from the hospital 12/17/20.  She presented to the IR clinic 12/31/20 for a drain evaluation including a CT scan and drain injection. At that time the drain was to suction with 10-15 ml daily output, and she was flushing it once daily with 5 ml NS. CT imaging showed resolution of the abscess and contrast drain injection was negative for a fistula. Out of an abundance of caution given the past history with the abscess/drain, Dr. Dema Severin was consulted and a decision was reached to place the drain to gravity, discontinue flushing, and for the patient to return in one week.      Barbara Yates returns to the outpatient clinic today for a repeat drain injection. In the past week she has had a total output of approximately 5 ml. She denies fevers, chills or fatigue. She has been eating and drinking well and has no complaints.     Past Medical History:  Diagnosis Date  . Anxiety   . Chronic back pain   . Depression   . Diverticulitis   . Hyperlipidemia   . Insomnia   . Migraine headache   . Myofascial pain     Past Surgical History:  Procedure Laterality Date  . CERVICAL SPINE SURGERY    . IR RADIOLOGIST EVAL & MGMT  11/17/2020  . IR RADIOLOGIST EVAL & MGMT   12/31/2020  . TUBAL LIGATION      Allergies: Patient has no known allergies.  Medications: Prior to Admission medications   Medication Sig Start Date End Date Taking? Authorizing Provider  acetaminophen (TYLENOL) 500 MG tablet Take 1,000 mg by mouth every 6 (six) hours as needed for moderate pain.    [provider]  ALPRAZolam (XANAX) 1 MG tablet TAKE 1 TABLET EVERY DAY AS NEEDED FOR ANXIETY Patient taking differently: Take 1 mg by mouth daily as needed for anxiety. 12/03/20   Isaac Bliss, Rayford Halsted, MD  amLODipine (NORVASC) 5 MG tablet Take 1 tablet (5 mg total) by mouth daily. 12/18/20   Saverio Danker, PA-C  atorvastatin (LIPITOR) 10 MG tablet Take 1 tablet (10 mg total) by mouth daily. 11/06/20   Isaac Bliss, Rayford Halsted, MD  citalopram (CELEXA) 40 MG tablet TAKE 1 TABLET BY MOUTH EVERY DAY Patient taking differently: Take 40 mg by mouth daily. 12/03/20   Isaac Bliss, Rayford Halsted, MD  gabapentin (NEURONTIN) 100 MG capsule Take 1 capsule (100 mg total) by mouth 2 (two) times daily as needed. 12/17/20   Saverio Danker, PA-C  ibuprofen (ADVIL) 200 MG tablet Take 3 tablets (600 mg total) by mouth every 8 (eight) hours as needed (pain). 12/17/20   Saverio Danker, PA-C  methocarbamol (ROBAXIN) 500 MG tablet Take 2 tablets (1,000 mg total) by mouth every 8 (eight) hours. 12/17/20   Saverio Danker, PA-C  ondansetron [facility]  ODT) 4 MG disintegrating tablet Take 1 tablet (4 mg total) by mouth every 8 (eight) hours as needed for nausea or vomiting. Patient not taking: Reported on 12/11/2020 10/28/20   Barrie Folk, PA-C  oxyCODONE (OXY IR/ROXICODONE) 5 MG immediate release tablet Take 1-2 tablets (5-10 mg total) by mouth every 4 (four) hours as needed for severe pain or breakthrough pain. 12/17/20   Saverio Danker, PA-C  valACYclovir (VALTREX) 1000 MG tablet TAKE 1 TABLET BY MOUTH EVERY DAY Patient taking differently: Take 1,000 mg by mouth daily as needed (breakouts). 07/21/20    Isaac Bliss, Rayford Halsted, MD  zolpidem (AMBIEN) 10 MG tablet TAKE 1 TABLET BY MOUTH AT BEDTIME AS NEEDED FOR SLEEP. Patient taking differently: Take 10 mg by mouth at bedtime as needed for sleep. 12/02/20   Isaac Bliss, Rayford Halsted, MD     Family History  Problem Relation Age of Onset  . Hypertension Paternal Grandmother   . CVA Paternal Grandmother   . Alzheimer's disease Paternal Grandmother   . Cancer Paternal Grandfather        Pancreatic  . Pancreatic cancer Maternal Grandfather     Social History   Socioeconomic History  . Marital status: Significant Other    Spouse name: Not on file  . Number of children: Not on file  . Years of education: Not on file  . Highest education level: Not on file  Occupational History  . Not on file  Tobacco Use  . Smoking status: Former Smoker    Packs/day: 0.25    Years: 3.00    Pack years: 0.75    Types: Cigarettes    Start date: 08/13/1995    Quit date: 09/27/1998    Years since quitting: 22.2  . Smokeless tobacco: Never Used  . Tobacco comment: quit in 1999  Vaping Use  . Vaping Use: Never used  Substance and Sexual Activity  . Alcohol use: Yes    Alcohol/week: 4.0 standard drinks    Types: 4 Shots of liquor per week  . Drug use: No  . Sexual activity: Yes    Birth control/protection: Surgical  Other Topics Concern  . Not on file  Social History Narrative   ** Merged History Encounter **       Social Determinants of Health   Financial Resource Strain: Not on file  Food Insecurity: Not on file  Transportation Needs: Not on file  Physical Activity: Not on file  Stress: Not on file  Social Connections: Not on file     Vital Signs: BP (!) 147/89   Pulse 75   Temp 98.3 F (36.8 C)   SpO2 98%   Physical Exam Constitutional:      General: She is not in acute distress. Abdominal:     Palpations: Abdomen is soft.     Tenderness: There is no abdominal tenderness.     Comments: RLQ drain to gravity. Scant amount  of serous fluid in bag. Site with slight erythema and crusting at the skin insertion site. Sutures and stat-lock in place.   Musculoskeletal:        General: Normal range of motion.  Skin:    General: Skin is warm and dry.  Neurological:     Mental Status: She is alert and oriented to person, place, and time.  Psychiatric:        Mood and Affect: Mood normal.        Behavior: Behavior normal.        Thought  Content: Thought content normal.        Judgment: Judgment normal.     Imaging: No results found.  Labs:  CBC: Recent Labs    12/13/20 0108 12/14/20 0102 12/15/20 0255 12/16/20 0107  WBC 14.4* 11.6* 7.8 8.8  HGB 10.8* 10.9* 10.8* 11.3*  HCT 32.6* 32.9* 32.1* 34.6*  PLT 242 267 289 300    COAGS: Recent Labs    11/07/20 0909 12/15/20 0255  INR 1.1 1.1    BMP: Recent Labs    12/13/20 0108 12/14/20 0102 12/15/20 0255 12/16/20 0107  NA 134* 136 137 137  K 3.3* 3.7 3.3* 3.7  CL 102 102 102 100  CO2 22 25 26 27   GLUCOSE 84 90 86 112*  BUN 5* <5* <5* <5*  CALCIUM 7.9* 8.3* 8.3* 8.2*  CREATININE 1.03* 0.95 1.00 0.88  GFRNONAA >60 >60 >60 >60    LIVER FUNCTION TESTS: Recent Labs    10/28/20 1700 11/06/20 1807 12/10/20 1713 12/14/20 0102  BILITOT 0.6 0.3 1.2 0.8  AST 12* 10* 14* 8*  ALT 11 8 13 9   ALKPHOS 96 96 100 91  PROT 8.1 7.9 7.6 6.5  ALBUMIN 3.9 3.4* 3.9 2.8*    Assessment:   RLQ diverticular vs adnexal abscess with drain placement 12/15/20:   Contrast drain injection did not demonstrate a fistula and the drain was removed without incident. She stated she has upcoming appointments with a primary care provider and a gynecologist. She was encouraged to keep these appointments. She was instructed to not shower until the drain site has closed up (approximately 24-48 hours) and to not submerge the site for one week. She knows to call the clinic if she has any pain, tenderness, swelling or purulent output from the drain site. No further follow up  is necessary with IR but she knows she can call us with any questions or concerns.    Signed: Theresa Duty, NP 01/07/2021, 1:52 PM   Please refer to Dr. Fritz Pickerel attestation of this note for management and plan.

## 2021-01-08 ENCOUNTER — Other Ambulatory Visit: Payer: Self-pay

## 2021-01-08 ENCOUNTER — Encounter: Payer: Self-pay | Admitting: Internal Medicine

## 2021-01-08 ENCOUNTER — Ambulatory Visit (INDEPENDENT_AMBULATORY_CARE_PROVIDER_SITE_OTHER): Payer: BC Managed Care – PPO | Admitting: Internal Medicine

## 2021-01-08 VITALS — BP 130/90 | HR 81 | Temp 98.2°F | Wt 220.9 lb

## 2021-01-08 DIAGNOSIS — N739 Female pelvic inflammatory disease, unspecified: Secondary | ICD-10-CM

## 2021-01-08 DIAGNOSIS — Z09 Encounter for follow-up examination after completed treatment for conditions other than malignant neoplasm: Secondary | ICD-10-CM | POA: Diagnosis not present

## 2021-01-08 DIAGNOSIS — L0291 Cutaneous abscess, unspecified: Secondary | ICD-10-CM | POA: Diagnosis not present

## 2021-01-08 NOTE — Progress Notes (Signed)
Established Patient Office Visit     This visit occurred during the SARS-CoV-2 public health emergency.  Safety protocols were in place, including screening questions prior to the visit, additional usage of staff PPE, and extensive cleaning of exam room while observing appropriate contact time as indicated for disinfecting solutions.    CC/Reason for Visit: Hospital follow-up  HPI: Barbara Yates is a 50 y.o. female who is coming in today for the above mentioned reasons.  She has now had 2 hospitalizations and drains for an intra-abdominal abscess.  It has not been clear whether the abscess is a tubo-ovarian abscess versus a perforated colonic diverticula.  She has had multiple CT scans that have not been clear.  She has had several courses of antibiotics.  She just had her second drain removed yesterday.  She is scheduled for colonoscopy on March 1 with Dr. Rush Landmark to further determine possibility of colonic etiology.  She is seeing GYN later this month.   Past Medical/Surgical History: Past Medical History:  Diagnosis Date  . Anxiety   . Chronic back pain   . Depression   . Diverticulitis   . Hyperlipidemia   . Insomnia   . Migraine headache   . Myofascial pain     Past Surgical History:  Procedure Laterality Date  . CERVICAL SPINE SURGERY    . IR RADIOLOGIST EVAL & MGMT  11/17/2020  . IR RADIOLOGIST EVAL & MGMT  12/31/2020  . IR RADIOLOGIST EVAL & MGMT  01/07/2021  . TUBAL LIGATION      Social History:  reports that she quit smoking about 22 years ago. Her smoking use included cigarettes. She started smoking about 25 years ago. She has a 0.75 pack-year smoking history. She has never used smokeless tobacco. She reports current alcohol use of about 4.0 standard drinks of alcohol per week. She reports that she does not use drugs.  Allergies: No Known Allergies  Family History:  Family History  Problem Relation Age of Onset  . Hypertension Paternal Grandmother    . CVA Paternal Grandmother   . Alzheimer's disease Paternal Grandmother   . Cancer Paternal Grandfather        Pancreatic  . Pancreatic cancer Maternal Grandfather      Current Outpatient Medications:  .  acetaminophen (TYLENOL) 500 MG tablet, Take 1,000 mg by mouth every 6 (six) hours as needed for moderate pain., Disp: , Rfl:  .  ALPRAZolam (XANAX) 1 MG tablet, TAKE 1 TABLET EVERY DAY AS NEEDED FOR ANXIETY (Patient taking differently: Take 1 mg by mouth daily as needed for anxiety.), Disp: 30 tablet, Rfl: 0 .  atorvastatin (LIPITOR) 10 MG tablet, Take 1 tablet (10 mg total) by mouth daily., Disp: 90 tablet, Rfl: 1 .  citalopram (CELEXA) 40 MG tablet, TAKE 1 TABLET BY MOUTH EVERY DAY (Patient taking differently: Take 40 mg by mouth daily.), Disp: 90 tablet, Rfl: 1 .  ibuprofen (ADVIL) 200 MG tablet, Take 3 tablets (600 mg total) by mouth every 8 (eight) hours as needed (pain)., Disp: 30 tablet, Rfl: 0 .  valACYclovir (VALTREX) 1000 MG tablet, TAKE 1 TABLET BY MOUTH EVERY DAY (Patient taking differently: Take 1,000 mg by mouth daily as needed (breakouts).), Disp: 90 tablet, Rfl: 1 .  zolpidem (AMBIEN) 10 MG tablet, TAKE 1 TABLET BY MOUTH AT BEDTIME AS NEEDED FOR SLEEP. (Patient taking differently: Take 10 mg by mouth at bedtime as needed for sleep.), Disp: 15 tablet, Rfl: 1 .  amLODipine (NORVASC)  5 MG tablet, Take 1 tablet (5 mg total) by mouth daily. (Patient not taking: Reported on 01/08/2021), Disp: 30 tablet, Rfl: 0 .  gabapentin (NEURONTIN) 100 MG capsule, Take 1 capsule (100 mg total) by mouth 2 (two) times daily as needed. (Patient not taking: Reported on 01/08/2021), Disp: 20 capsule, Rfl: 1 .  methocarbamol (ROBAXIN) 500 MG tablet, Take 2 tablets (1,000 mg total) by mouth every 8 (eight) hours. (Patient not taking: Reported on 01/08/2021), Disp: 50 tablet, Rfl: 0 .  ondansetron (ZOFRAN ODT) 4 MG disintegrating tablet, Take 1 tablet (4 mg total) by mouth every 8 (eight) hours as needed for  nausea or vomiting. (Patient not taking: No sig reported), Disp: 12 tablet, Rfl: 0 .  oxyCODONE (OXY IR/ROXICODONE) 5 MG immediate release tablet, Take 1-2 tablets (5-10 mg total) by mouth every 4 (four) hours as needed for severe pain or breakthrough pain. (Patient not taking: Reported on 01/08/2021), Disp: 25 tablet, Rfl: 0  Review of Systems:  Constitutional: Denies fever, chills, diaphoresis, appetite change and fatigue.  HEENT: Denies photophobia, eye pain, redness, hearing loss, ear pain, congestion, sore throat, rhinorrhea, sneezing, mouth sores, trouble swallowing, neck pain, neck stiffness and tinnitus.   Respiratory: Denies SOB, DOE, cough, chest tightness,  and wheezing.   Cardiovascular: Denies chest pain, palpitations and leg swelling.  Gastrointestinal: Denies nausea, vomiting,  diarrhea, constipation, blood in stool and abdominal distention.  Genitourinary: Denies dysuria, urgency, frequency, hematuria, flank pain and difficulty urinating.  Endocrine: Denies: hot or cold intolerance, sweats, changes in hair or nails, polyuria, polydipsia. Musculoskeletal: Denies myalgias, back pain, joint swelling, arthralgias and gait problem.  Skin: Denies pallor, rash and wound.  Neurological: Denies dizziness, seizures, syncope, weakness, light-headedness, numbness and headaches.  Hematological: Denies adenopathy. Easy bruising, personal or family bleeding history  Psychiatric/Behavioral: Denies suicidal ideation, mood changes, confusion, nervousness, sleep disturbance and agitation    Physical Exam: Vitals:   01/08/21 1509  BP: 130/90  Pulse: 81  Temp: 98.2 F (36.8 C)  TempSrc: Oral  SpO2: 96%  Weight: 220 lb 14.4 oz (100.2 kg)    Body mass index is 32.62 kg/m.   Constitutional: NAD, calm, comfortable Eyes: PERRL, lids and conjunctivae normal ENMT: Mucous membranes are moist. Neurologic: Grossly intact and nonfocal Psychiatric: Normal judgment and insight. Alert and oriented  x 3. Normal mood.    Impression and Plan:  Hospital discharge follow-up  Pelvic abscess in female  [facility] charts have been reviewed.  She definitely had signs of inflammation on CT scan with abscess.  Her second drain was removed yesterday by IR.  They are still trying to determine whether it is ovarian or colonic in nature.  She is scheduled for colonoscopy beginning of next month.  She will see her GYN later this month as well.  She is improving although admits to some anxiety from all of this..  She is requesting second opinions from surgery.  Have advised that she call [facility] and request one.    Lelon Frohlich, MD [facility] at [facility]

## 2021-01-14 DIAGNOSIS — Z8619 Personal history of other infectious and parasitic diseases: Secondary | ICD-10-CM | POA: Diagnosis not present

## 2021-01-14 DIAGNOSIS — Z3040 Encounter for surveillance of contraceptives, unspecified: Secondary | ICD-10-CM | POA: Diagnosis not present

## 2021-01-14 DIAGNOSIS — Z1211 Encounter for screening for malignant neoplasm of colon: Secondary | ICD-10-CM | POA: Diagnosis not present

## 2021-01-14 DIAGNOSIS — Z1231 Encounter for screening mammogram for malignant neoplasm of breast: Secondary | ICD-10-CM | POA: Diagnosis not present

## 2021-01-14 DIAGNOSIS — N898 Other specified noninflammatory disorders of vagina: Secondary | ICD-10-CM | POA: Diagnosis not present

## 2021-01-14 DIAGNOSIS — Z01419 Encounter for gynecological examination (general) (routine) without abnormal findings: Secondary | ICD-10-CM | POA: Diagnosis not present

## 2021-01-18 ENCOUNTER — Other Ambulatory Visit: Payer: Self-pay | Admitting: Internal Medicine

## 2021-01-18 DIAGNOSIS — F419 Anxiety disorder, unspecified: Secondary | ICD-10-CM

## 2021-01-19 ENCOUNTER — Ambulatory Visit (AMBULATORY_SURGERY_CENTER): Payer: BC Managed Care – PPO

## 2021-01-19 ENCOUNTER — Other Ambulatory Visit: Payer: Self-pay

## 2021-01-19 VITALS — Ht 68.5 in | Wt 224.0 lb

## 2021-01-19 DIAGNOSIS — Z1211 Encounter for screening for malignant neoplasm of colon: Secondary | ICD-10-CM

## 2021-01-19 NOTE — Progress Notes (Signed)
Pt verified name, DOB, address and insurance during PV today.   Pt mailed instruction packet to included paper to complete and mail back to [facility] with addressed and stamped envelope, Emmi video, copy of consent form to read and not return, and instructions. . PV completed over the phone. Pt encouraged to call with questions or issues   Self report wt  224  No allergies to soy or egg Pt is not on blood thinners or diet pills Denies issues with sedation/intubation Denies atrial flutter/fib Denies constipation   Emmi instructions given to pt  Pt is aware of Covid safety and care partner requirements.

## 2021-01-28 ENCOUNTER — Encounter: Payer: Self-pay | Admitting: Gastroenterology

## 2021-02-02 ENCOUNTER — Encounter: Payer: Self-pay | Admitting: Gastroenterology

## 2021-02-02 ENCOUNTER — Other Ambulatory Visit: Payer: Self-pay

## 2021-02-02 ENCOUNTER — Ambulatory Visit (AMBULATORY_SURGERY_CENTER): Payer: BC Managed Care – PPO | Admitting: Gastroenterology

## 2021-02-02 VITALS — BP 166/96 | HR 80 | Temp 97.3°F | Resp 14 | Ht 68.5 in | Wt 224.0 lb

## 2021-02-02 DIAGNOSIS — D125 Benign neoplasm of sigmoid colon: Secondary | ICD-10-CM

## 2021-02-02 DIAGNOSIS — Z1211 Encounter for screening for malignant neoplasm of colon: Secondary | ICD-10-CM | POA: Diagnosis not present

## 2021-02-02 DIAGNOSIS — D124 Benign neoplasm of descending colon: Secondary | ICD-10-CM

## 2021-02-02 DIAGNOSIS — D120 Benign neoplasm of cecum: Secondary | ICD-10-CM

## 2021-02-02 DIAGNOSIS — K6389 Other specified diseases of intestine: Secondary | ICD-10-CM

## 2021-02-02 MED ORDER — SODIUM CHLORIDE 0.9 % IV SOLN
500.0000 mL | Freq: Once | INTRAVENOUS | Status: DC
Start: 2021-02-02 — End: 2021-02-02

## 2021-02-02 MED ORDER — ONDANSETRON 4 MG PO TBDP: 4.0000 mg | ORAL_TABLET | Freq: Three times a day (TID) | ORAL | 0 refills | Status: DC | PRN

## 2021-02-02 MED ORDER — SODIUM CHLORIDE 0.9 % IV SOLN
4.0000 mg | Freq: Once | INTRAVENOUS | Status: AC
  Administered 2021-02-02: 4 mg via INTRAVENOUS

## 2021-02-02 NOTE — Progress Notes (Signed)
PT taken to PACU. Monitors in place. VSS. Report given to RN. 

## 2021-02-02 NOTE — Patient Instructions (Addendum)
Handouts given for polyps, diverticulosis and high fiber diet.  Pick up Rx for zofran.   YOU HAD AN ENDOSCOPIC PROCEDURE TODAY AT [facility]:   Refer to the procedure report that was given to you for any specific questions about what was found during the examination.  If the procedure report does not answer your questions, please call your gastroenterologist to clarify.  If you requested that your care partner not be given the details of your procedure findings, then the procedure report has been included in a sealed envelope for you to review at your convenience later.  YOU SHOULD EXPECT: Some feelings of bloating in the abdomen. Passage of more gas than usual.  Walking can help get rid of the air that was put into your GI tract during the procedure and reduce the bloating. If you had a lower endoscopy (such as a colonoscopy or flexible sigmoidoscopy) you may notice spotting of blood in your stool or on the toilet paper. If you underwent a bowel prep for your procedure, you may not have a normal bowel movement for a few days.  Please Note:  You might notice some irritation and congestion in your nose or some drainage.  This is from the oxygen used during your procedure.  There is no need for concern and it should clear up in a day or so.  SYMPTOMS TO REPORT IMMEDIATELY:   Following lower endoscopy (colonoscopy):  Excessive amounts of blood in the stool  Significant tenderness or worsening of abdominal pains  Swelling of the abdomen that is new, acute  Fever of 100F or higher  For urgent or emergent issues, a gastroenterologist can be reached at any hour by calling [phone]. Do not use MyChart messaging for urgent concerns.    DIET:  We do recommend a small meal at first, but then you may proceed to your regular diet.  Drink plenty of fluids but you should avoid alcoholic beverages for 24 hours.  ACTIVITY:  You should plan to take it easy for the rest of today and  you should NOT DRIVE or use heavy machinery until tomorrow (because of the sedation medicines used during the test).    FOLLOW UP: Our staff will call the number listed on your records Thursday 3/3 between 715 am and 8 am following your procedure to check on you and address any questions or concerns that you may have regarding the information given to you following your procedure. If we do not reach you, we will leave a message.  We will attempt to reach you two times.  During this call, we will ask if you have developed any symptoms of COVID 19. If you develop any symptoms (ie: fever, flu-like symptoms, shortness of breath, cough etc.) before then, please call [phone].  If you test positive for Covid 19 in the 2 weeks post procedure, please call and report this information to [place].    If any biopsies were taken you will be contacted by phone or by letter and MyChart within the next 1-3 weeks.  Please call us at [phone] if you have not heard about the biopsies in 3 weeks.    SIGNATURES/CONFIDENTIALITY: You and/or your care partner have signed paperwork which will be entered into your electronic medical record.  These signatures attest to the fact that that the information above on your After Visit Summary has been reviewed and is understood.  Full responsibility of the confidentiality of this discharge information lies with you  and/or your care-partner. 

## 2021-02-02 NOTE — Op Note (Signed)
[place] Patient Name: Barbara Yates Procedure Date: 02/02/2021 9:40 AM MRN: [id number] Endoscopist: Justice Britain , MD Age: 50 Referring MD:  Date of Birth: 1971/11/12 Gender: Female Account #: [id number] Procedure:                Colonoscopy Indications:              Screening for colorectal malignant neoplasm, This                            is the patient's first colonoscopy, Incidental -                            Abnormal CT of the GI tract concerning for recent                            diverticulitis vs tuboovarian abscess or other                            etiology Medicines:                Monitored Anesthesia Care, Glycopyrrolate 0.2 mg                            IV, Ondansetron 4 mg IV Procedure:                Pre-Anesthesia Assessment:                           - Prior to the procedure, a History and Physical                            was performed, and patient medications and                            allergies were reviewed. The patient's tolerance of                            previous anesthesia was also reviewed. The risks                            and benefits of the procedure and the sedation                            options and risks were discussed with the patient.                            All questions were answered, and informed consent                            was obtained. Prior Anticoagulants: The patient has                            taken no previous anticoagulant or antiplatelet  agents except for NSAID medication. ASA Grade                            Assessment: II - A patient with mild systemic                            disease. After reviewing the risks and benefits,                            the patient was deemed in satisfactory condition to                            undergo the procedure.                           After obtaining informed consent, the colonoscope                             was passed under direct vision. Throughout the                            procedure, the patient's blood pressure, pulse, and                            oxygen saturations were monitored continuously. The                            Olympus PCF-H190DL (#[id number]) Colonoscope was                            introduced through the anus and advanced to the the                            cecum, identified by the appendiceal orifice. The                            colonoscopy was performed without difficulty. The                            patient tolerated the procedure though at one point                            did develop bradycardia requiring Robinul as well                            as some retching felt to be combination of                            abdominal distention/gas and need for vomiting..                            The quality of the bowel preparation was adequate.  The terminal ileum, ileocecal valve, appendiceal                            orifice, and rectum were photographed. Scope In: 9:47:25 AM Scope Out: 10:18:41 AM Scope Withdrawal Time: 0 hours 28 minutes 20 seconds  Total Procedure Duration: 0 hours 31 minutes 16 seconds  Findings:                 The digital rectal exam findings include                            hemorrhoids. Pertinent negatives include no                            palpable rectal lesions.                           A patchy area of mucosa in the terminal ileum was                            mildly erythematous. Biopsies were taken with a                            cold forceps for histology.                           Five sessile and semi-sessile polyps were found in                            the sigmoid colon, descending colon and cecum. The                            polyps were 2 to 10 mm in size. These polyps were                            removed with a cold snare. Resection and retrieval                             were complete.                           Many small-mouthed diverticula were found in the                            recto-sigmoid colon, sigmoid colon, descending                            colon, transverse colon and hepatic flexure.                           A localized area of mildly erythematous mucosa was                            found in the recto-sigmoid colon and in the sigmoid  colon. This was biopsied with a cold forceps for                            histology.                           Normal mucosa was found in the entire colon                            otherwise.                           Non-bleeding non-thrombosed external and internal                            hemorrhoids were found during retroflexion, during                            perianal exam and during digital exam. The                            hemorrhoids were Grade II (internal hemorrhoids                            that prolapse but reduce spontaneously). Complications:            No immediate complications. Estimated Blood Loss:     Estimated blood loss was minimal. Impression:               - Hemorrhoids found on digital rectal exam.                           - Erythematous mucosa in the terminal ileum.                            Biopsied.                           - Five 2 to 10 mm polyps in the sigmoid colon, in                            the descending colon and in the cecum, removed with                            a cold snare. Resected and retrieved.                           - Diverticulosis in the recto-sigmoid colon, in the                            sigmoid colon, in the descending colon, in the                            transverse colon and at the hepatic flexure.                           -  Erythematous mucosa in the recto-sigmoid colon                            and in the sigmoid colon. Biopsied.                           - Normal mucosa in the entire examined  colon                            otherwise.                           - Non-bleeding non-thrombosed external and internal                            hemorrhoids. Recommendation:           - The patient will be observed post-procedure,                            until all discharge criteria are met.                           - Discharge patient to home.                           - Patient has a contact number available for                            emergencies. The signs and symptoms of potential                            delayed complications were discussed with the                            patient. Return to normal activities tomorrow.                            Written discharge instructions were provided to the                            patient.                           - High fiber diet.                           - Use FiberCon 1-2 tablets PO daily.                           - Continue present medications.                           - Await pathology results.                           - Repeat colonoscopy in 3 years for surveillance  based on pathology results.                           - Follow up with Colorectal Surgery for further                            evaluation and discussion of next steps/evaluation                            of recent complicated abscess.                           - The findings and recommendations were discussed                            with the patient.                           - The findings and recommendations were discussed                            with the designated responsible adult. Justice Britain, MD 02/02/2021 10:29:43 AM

## 2021-02-02 NOTE — Progress Notes (Signed)
Called to room to assist during endoscopic procedure.  Patient ID and intended procedure confirmed with present staff. Received instructions for my participation in the procedure from the performing physician.  

## 2021-02-02 NOTE — Progress Notes (Signed)
Pt's states no medical or surgical changes since previsit or office visit.  CW - vitals 

## 2021-02-02 NOTE — Progress Notes (Signed)
PT had a brief episode of bradycardia near the end of the procedure, likely a vagal response. 0.2 mg robinul given. Small amount of bile colored fluid was also noted and suctioned during the same time period. 4mg  zofran given. VSS at end of procedure. Pt is a/o x3

## 2021-02-04 ENCOUNTER — Telehealth: Payer: Self-pay | Admitting: *Deleted

## 2021-02-04 ENCOUNTER — Telehealth: Payer: Self-pay

## 2021-02-04 NOTE — Telephone Encounter (Signed)
  Follow up Call-  Call back number 02/02/2021  Post procedure Call Back phone  # [phone]  Permission to leave phone message Yes  Some recent data might be hidden     Patient questions:  Do you have a fever, pain , or abdominal swelling? No. Pain Score  0 *  Have you tolerated food without any problems? Yes.    Have you been able to return to your normal activities? Yes.    Do you have any questions about your discharge instructions: Diet   No. Medications  No. Follow up visit  No.  Do you have questions or concerns about your Care? No.  Actions: * If pain score is 4 or above: No action needed, pain <4.  1. Have you developed a fever since your procedure? no  2.   Have you had an respiratory symptoms (SOB or cough) since your procedure? no  3.   Have you tested positive for COVID 19 since your procedure no  4.   Have you had any family members/close contacts diagnosed with the COVID 19 since your procedure?  no   If yes to any of these questions please route to Joylene John, RN and Joella Prince, RN

## 2021-02-04 NOTE — Telephone Encounter (Signed)
First attempt follow up call to pt, lm on vm 

## 2021-02-09 ENCOUNTER — Other Ambulatory Visit: Payer: Self-pay | Admitting: Internal Medicine

## 2021-02-09 DIAGNOSIS — G4700 Insomnia, unspecified: Secondary | ICD-10-CM

## 2021-02-09 DIAGNOSIS — K5792 Diverticulitis of intestine, part unspecified, without perforation or abscess without bleeding: Secondary | ICD-10-CM | POA: Diagnosis not present

## 2021-02-15 DIAGNOSIS — R928 Other abnormal and inconclusive findings on diagnostic imaging of breast: Secondary | ICD-10-CM | POA: Diagnosis not present

## 2021-02-15 DIAGNOSIS — K651 Peritoneal abscess: Secondary | ICD-10-CM | POA: Diagnosis not present

## 2021-02-16 ENCOUNTER — Encounter: Payer: Self-pay | Admitting: Gastroenterology

## 2021-02-23 ENCOUNTER — Encounter: Payer: Self-pay | Admitting: Internal Medicine

## 2021-03-02 DIAGNOSIS — K5790 Diverticulosis of intestine, part unspecified, without perforation or abscess without bleeding: Secondary | ICD-10-CM | POA: Diagnosis not present

## 2021-03-02 DIAGNOSIS — N739 Female pelvic inflammatory disease, unspecified: Secondary | ICD-10-CM | POA: Diagnosis not present

## 2021-03-02 DIAGNOSIS — K5732 Diverticulitis of large intestine without perforation or abscess without bleeding: Secondary | ICD-10-CM | POA: Diagnosis not present

## 2021-03-09 DIAGNOSIS — K651 Peritoneal abscess: Secondary | ICD-10-CM | POA: Diagnosis not present

## 2021-03-10 ENCOUNTER — Encounter: Payer: Self-pay | Admitting: Internal Medicine

## 2021-03-16 ENCOUNTER — Other Ambulatory Visit: Payer: Self-pay | Admitting: Internal Medicine

## 2021-03-16 DIAGNOSIS — F419 Anxiety disorder, unspecified: Secondary | ICD-10-CM

## 2021-03-21 ENCOUNTER — Other Ambulatory Visit: Payer: Self-pay | Admitting: Internal Medicine

## 2021-03-21 DIAGNOSIS — A6000 Herpesviral infection of urogenital system, unspecified: Secondary | ICD-10-CM

## 2021-04-12 DIAGNOSIS — N739 Female pelvic inflammatory disease, unspecified: Secondary | ICD-10-CM | POA: Diagnosis not present

## 2021-04-12 DIAGNOSIS — R109 Unspecified abdominal pain: Secondary | ICD-10-CM | POA: Diagnosis not present

## 2021-04-22 ENCOUNTER — Other Ambulatory Visit: Payer: Self-pay | Admitting: Internal Medicine

## 2021-04-22 DIAGNOSIS — G4700 Insomnia, unspecified: Secondary | ICD-10-CM

## 2021-04-28 IMAGING — CT CT ABD-PELV W/ CM
2 of 5 series · 15 of 46 positions shown, 17 images · IV contrast (APPLIED)
Comparison: 11/17/2020, 11/06/2020 and 10/28/2020.

CLINICAL DATA: Right lower quadrant abdominal pain. History of
diverticulitis with abscess.

EXAM:
CT ABDOMEN AND PELVIS WITH CONTRAST
TECHNIQUE: Multidetector CT imaging of the abdomen and pelvis was performed
using the standard protocol following bolus administration of
intravenous contrast.
CONTRAST:  100mL OMNIPAQUE IOHEXOL 350 MG/ML SOLN

[Series 3: abd/ pelvis 5.0 i30f 2 · axial · 0.88mm/px · z∈[+548,+1022]mm · 12 of 107 slices shown, 14 images]
[im 6/107  soft-tissue]
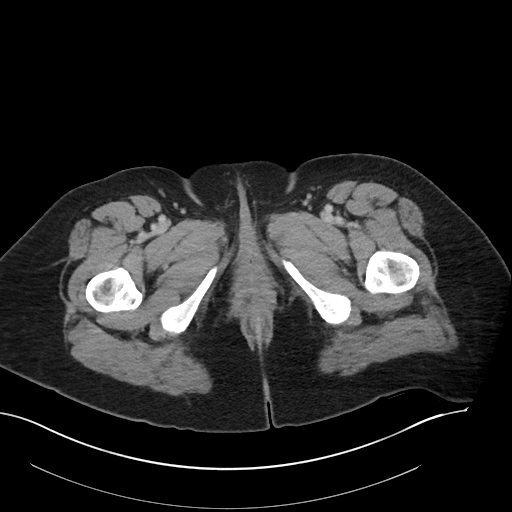
[im 6/107  bone]
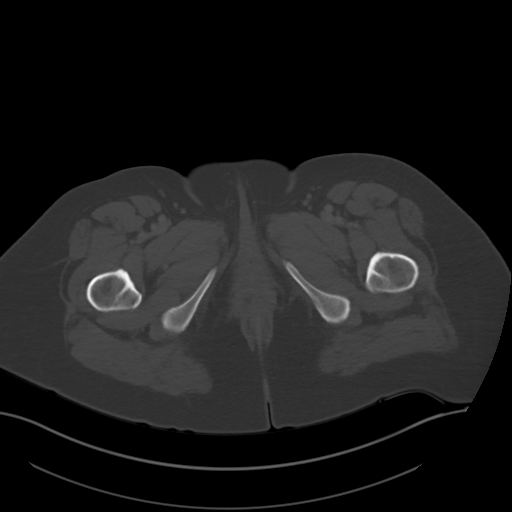
[im 16/107  soft-tissue]
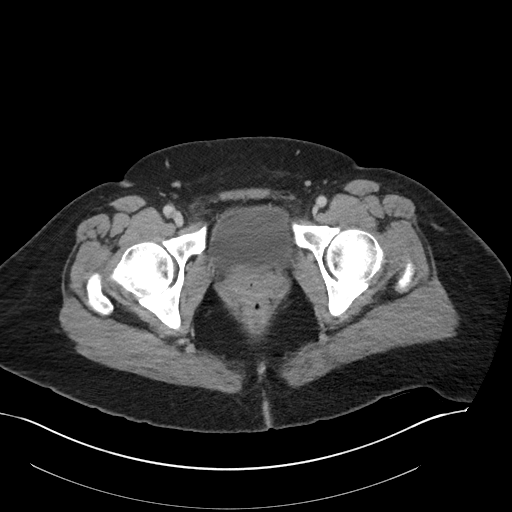
[im 22/107  soft-tissue]
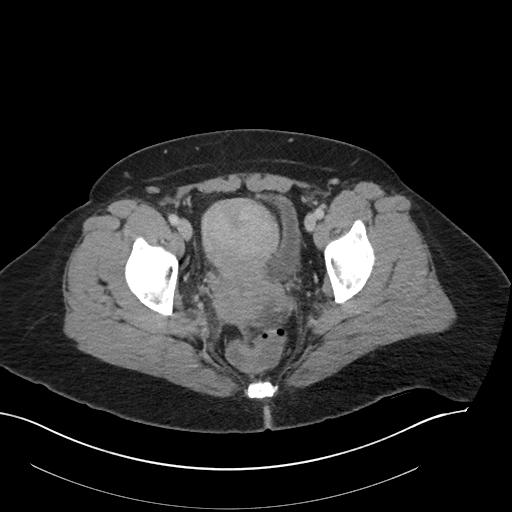
[im 32/107  soft-tissue]
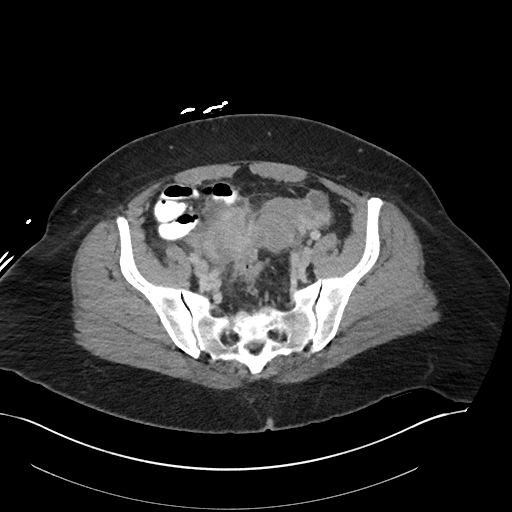
[im 43/107  soft-tissue]
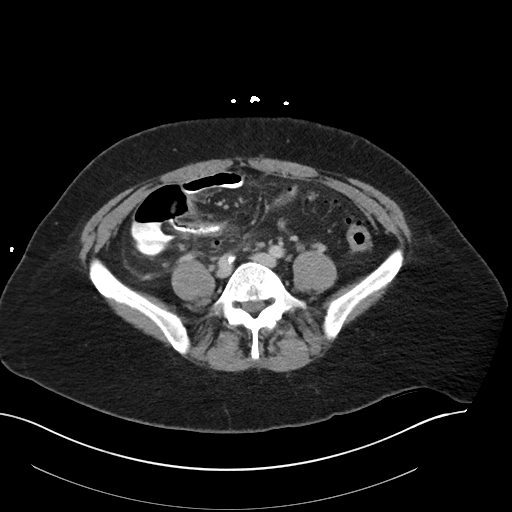
[im 48/107  soft-tissue]
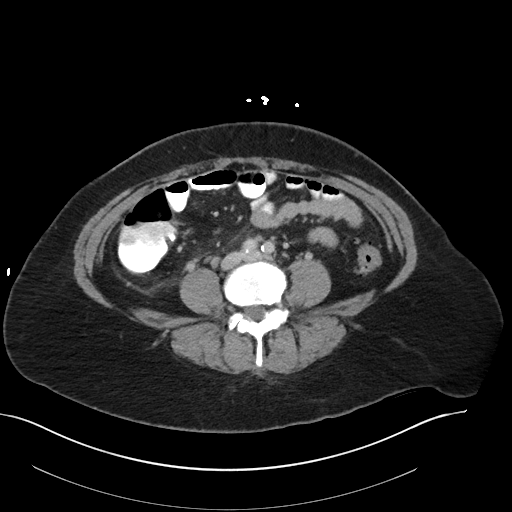
[im 59/107  soft-tissue]
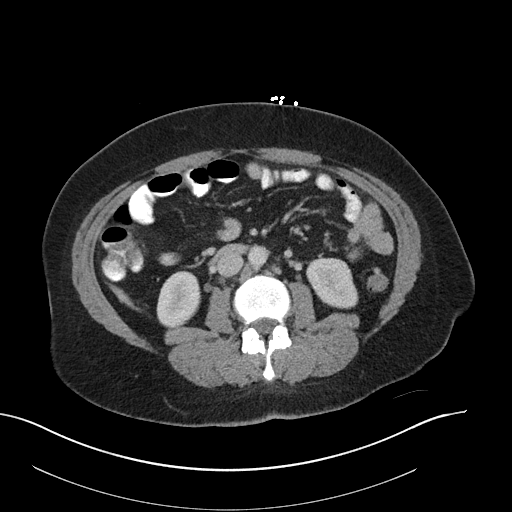
[im 64/107  soft-tissue]
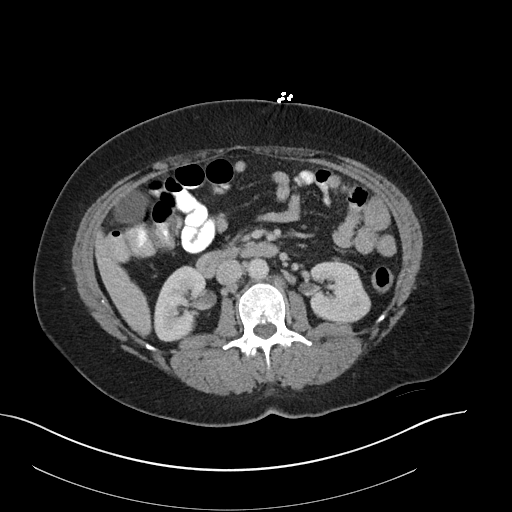
[im 75/107  soft-tissue]
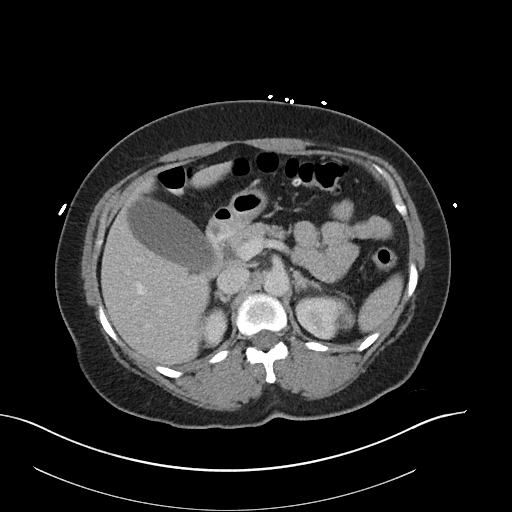
[im 75/107  bone]
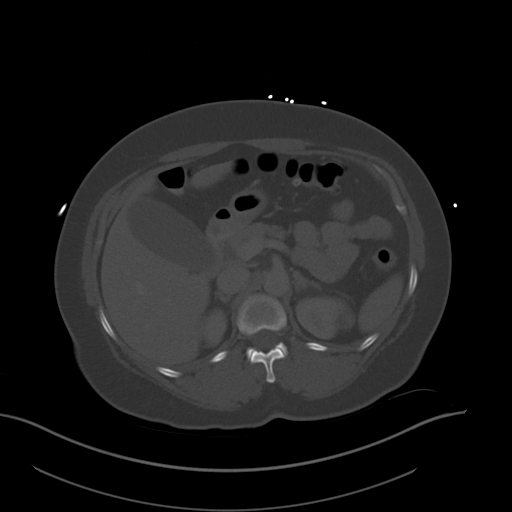
[im 85/107  soft-tissue]
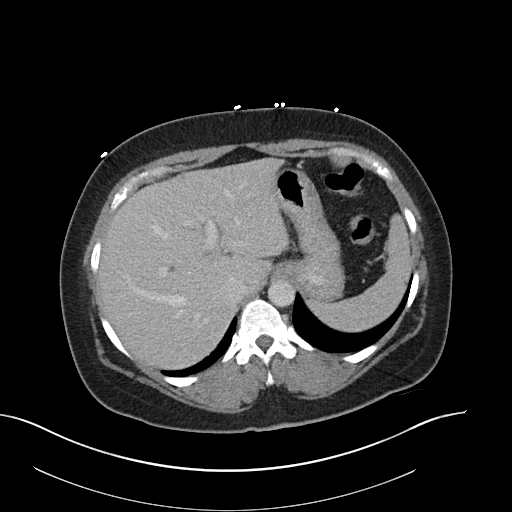
[im 91/107  soft-tissue]
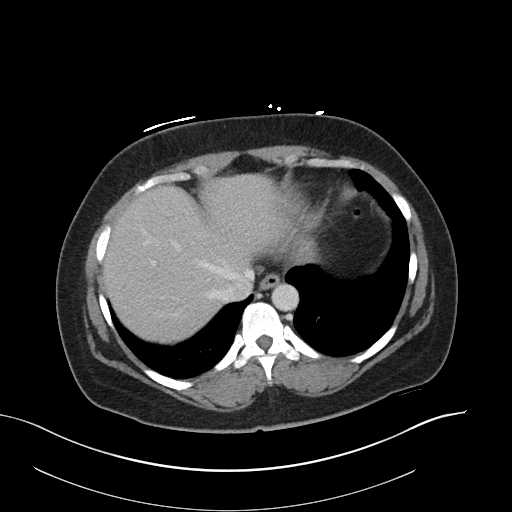
[im 101/107  soft-tissue]
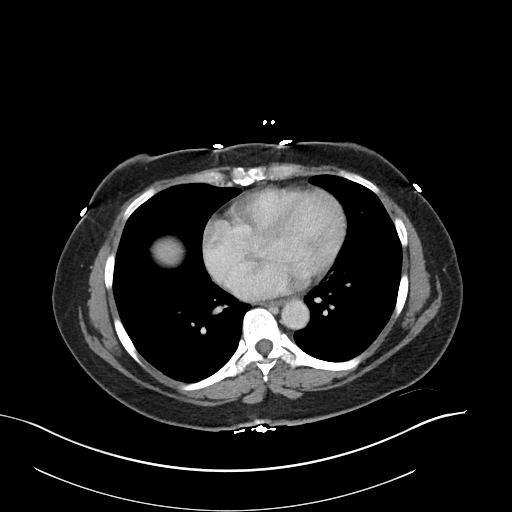

[Series 6: coronal soft tissue · coronal · 0.78mm/px · 3 of 101 slices shown]
[im 34/101  soft-tissue]
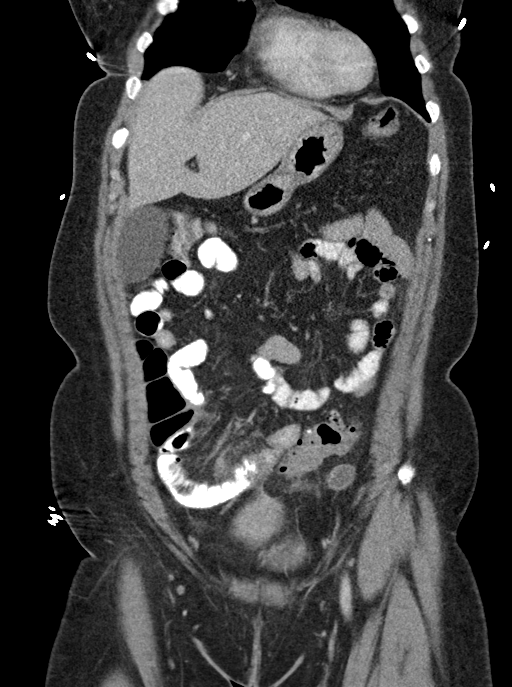
[im 45/101  soft-tissue]
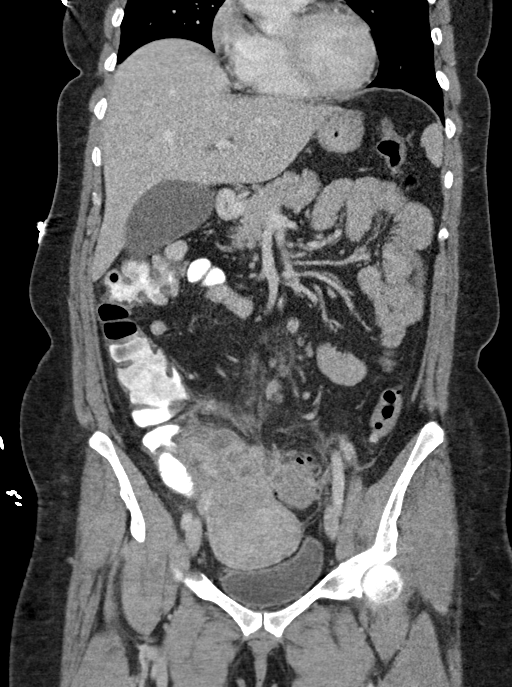
[im 56/101  soft-tissue]
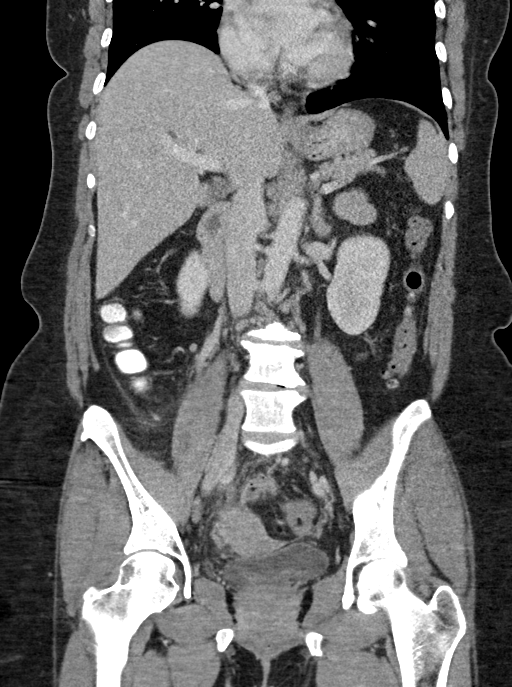

[15 of 46 positions shown; findings below may reference images not displayed]

FINDINGS: Lower chest: Clear lung bases.  Heart normal in size.

Hepatobiliary: Stable 7 mm segment 6 cyst. Liver otherwise
unremarkable. Normal gallbladder. No bile duct dilation.

Pancreas: Unremarkable. No pancreatic ductal dilatation or
surrounding inflammatory changes.

Spleen: Normal in size without focal abnormality.

Adrenals/Urinary Tract: Adrenal glands are unremarkable. Kidneys are
normal, without renal calculi, focal lesion, or hydronephrosis.
Bladder is unremarkable.

Stomach/Bowel: Heterogeneous inflammatory mass abuts the medial
margin of the cecum, containing a single small air bubble. There is
a small tubular structure containing air that extends above this
inflammatory collection, 5 mm in diameter consistent with the
appendiceal tip. The remainder of the appendix appears confluent
with the right lower quadrant inflammatory mass. The mass measures
approximately 5.5 x 4.0 x 4.1 cm. There is heterogeneous attenuation
that extends from the right aspect of the mid sigmoid colon to the
inflammatory mass. This portion of the sigmoid colon shows mild
adjacent inflammatory stranding and evidence of eccentric wall
thickening as well as numerous diverticula.

When compared to the prior exam, the inflammatory mass is increased
and the percutaneous drainage catheter has been removed.

Remainder of the colon shows additional diverticula throughout the
left colon, but no other evidence of inflammation. There is mild
wall thickening of the distal ileum as it passes over the right
lower quadrant inflammatory process. No other small bowel
abnormality. Normal stomach.

Vascular/Lymphatic: Scattered subcentimeter retroperitoneal lymph
nodes, stable. Minimal aortic atherosclerosis.

Reproductive: Prominent but stable uterus.  No ovarian masses.

Other: No free air.  No ascites.  No hernia.

Musculoskeletal: No fracture or acute finding. Degenerative changes
of the lower lumbar spine most evident at L3-L4. No bone lesions.
IMPRESSION: 1. Inflammatory mass in the right lower quadrant abutting the medial
aspect of the cecum and contiguous with portions of the appendix.
This appears to extend from the right margin of the mid sigmoid
colon, consistent with a peridiverticular phlegmon. No defined
drainable abscess is visualized, however. The distal appendix is
seen above this inflammatory mass, normal in diameter.
2. No other acute or abnormalities.
3. Minor aortic atherosclerosis.

## 2021-05-11 ENCOUNTER — Other Ambulatory Visit: Payer: Self-pay | Admitting: Internal Medicine

## 2021-05-31 ENCOUNTER — Other Ambulatory Visit: Payer: Self-pay | Admitting: Internal Medicine

## 2021-05-31 DIAGNOSIS — F419 Anxiety disorder, unspecified: Secondary | ICD-10-CM

## 2021-06-07 ENCOUNTER — Other Ambulatory Visit: Payer: Self-pay | Admitting: Internal Medicine

## 2021-07-12 ENCOUNTER — Other Ambulatory Visit: Payer: Self-pay | Admitting: Internal Medicine

## 2021-07-29 ENCOUNTER — Other Ambulatory Visit: Payer: Self-pay | Admitting: Internal Medicine

## 2021-07-29 DIAGNOSIS — G4700 Insomnia, unspecified: Secondary | ICD-10-CM

## 2021-07-29 DIAGNOSIS — F419 Anxiety disorder, unspecified: Secondary | ICD-10-CM

## 2021-09-16 ENCOUNTER — Other Ambulatory Visit: Payer: Self-pay | Admitting: Internal Medicine

## 2021-09-16 DIAGNOSIS — F419 Anxiety disorder, unspecified: Secondary | ICD-10-CM

## 2021-10-05 ENCOUNTER — Other Ambulatory Visit: Payer: Self-pay | Admitting: Internal Medicine

## 2021-10-05 DIAGNOSIS — A6000 Herpesviral infection of urogenital system, unspecified: Secondary | ICD-10-CM

## 2021-11-02 ENCOUNTER — Other Ambulatory Visit: Payer: Self-pay | Admitting: Family Medicine

## 2021-11-02 ENCOUNTER — Other Ambulatory Visit: Payer: Self-pay | Admitting: Internal Medicine

## 2021-11-02 DIAGNOSIS — F419 Anxiety disorder, unspecified: Secondary | ICD-10-CM

## 2021-11-02 DIAGNOSIS — G4700 Insomnia, unspecified: Secondary | ICD-10-CM

## 2021-11-05 NOTE — Telephone Encounter (Signed)
Dr. Jerilee Hoh patient

## 2021-11-18 ENCOUNTER — Other Ambulatory Visit: Payer: Self-pay | Admitting: Internal Medicine

## 2021-11-18 DIAGNOSIS — E785 Hyperlipidemia, unspecified: Secondary | ICD-10-CM

## 2021-12-13 ENCOUNTER — Other Ambulatory Visit: Payer: Self-pay | Admitting: Internal Medicine

## 2021-12-21 ENCOUNTER — Other Ambulatory Visit: Payer: Self-pay | Admitting: Internal Medicine

## 2021-12-21 DIAGNOSIS — F419 Anxiety disorder, unspecified: Secondary | ICD-10-CM

## 2021-12-22 NOTE — Telephone Encounter (Signed)
Last filled 11/05/21

## 2022-01-11 ENCOUNTER — Other Ambulatory Visit: Payer: Self-pay | Admitting: Internal Medicine

## 2022-01-11 ENCOUNTER — Encounter: Payer: Self-pay | Admitting: Internal Medicine

## 2022-01-11 ENCOUNTER — Ambulatory Visit (INDEPENDENT_AMBULATORY_CARE_PROVIDER_SITE_OTHER): Payer: BC Managed Care – PPO | Admitting: Internal Medicine

## 2022-01-11 VITALS — BP 110/80 | HR 80 | Temp 98.0°F | Ht 69.5 in | Wt 227.8 lb

## 2022-01-11 DIAGNOSIS — E785 Hyperlipidemia, unspecified: Secondary | ICD-10-CM

## 2022-01-11 DIAGNOSIS — Z0000 Encounter for general adult medical examination without abnormal findings: Secondary | ICD-10-CM | POA: Diagnosis not present

## 2022-01-11 DIAGNOSIS — E559 Vitamin D deficiency, unspecified: Secondary | ICD-10-CM | POA: Diagnosis not present

## 2022-01-11 DIAGNOSIS — Z23 Encounter for immunization: Secondary | ICD-10-CM | POA: Diagnosis not present

## 2022-01-11 DIAGNOSIS — K5732 Diverticulitis of large intestine without perforation or abscess without bleeding: Secondary | ICD-10-CM

## 2022-01-11 DIAGNOSIS — G4700 Insomnia, unspecified: Secondary | ICD-10-CM | POA: Diagnosis not present

## 2022-01-11 DIAGNOSIS — F419 Anxiety disorder, unspecified: Secondary | ICD-10-CM | POA: Diagnosis not present

## 2022-01-11 DIAGNOSIS — I700 Atherosclerosis of aorta: Secondary | ICD-10-CM

## 2022-01-11 LAB — COMPREHENSIVE METABOLIC PANEL
ALT: 10 U/L (ref 0–35)
AST: 14 U/L (ref 0–37)
Albumin: 4.2 g/dL (ref 3.5–5.2)
Alkaline Phosphatase: 90 U/L (ref 39–117)
BUN: 11 mg/dL (ref 6–23)
CO2: 32 mEq/L (ref 19–32)
Calcium: 8.8 mg/dL (ref 8.4–10.5)
Chloride: 105 mEq/L (ref 96–112)
Creatinine, Ser: 1.04 mg/dL (ref 0.40–1.20)
GFR: 63.6 mL/min (ref >60.00)
Glucose, Bld: 93 mg/dL (ref 70–99)
Potassium: 3.8 mEq/L (ref 3.5–5.1)
Sodium: 140 mEq/L (ref 135–145)
Total Bilirubin: 0.6 mg/dL (ref 0.2–1.2)
Total Protein: 7.1 g/dL (ref 6.0–8.3)

## 2022-01-11 LAB — LIPID PANEL
Cholesterol: 198 mg/dL (ref 0–200)
HDL: 67.1 mg/dL (ref >39.00)
LDL Cholesterol: 109 mg/dL — ABNORMAL HIGH (ref 0–99)
NonHDL: 130.81
Total CHOL/HDL Ratio: 3
Triglycerides: 107 mg/dL (ref 0.0–149.0)
VLDL: 21.4 mg/dL (ref 0.0–40.0)

## 2022-01-11 LAB — VITAMIN D 25 HYDROXY (VIT D DEFICIENCY, FRACTURES): VITD: 15.85 ng/mL — ABNORMAL LOW (ref 30.00–100.00)

## 2022-01-11 LAB — CBC WITH DIFFERENTIAL/PLATELET
Basophils Absolute: 0.1 10*3/uL (ref 0.0–0.1)
Basophils Relative: 1.2 % (ref 0.0–3.0)
Eosinophils Absolute: 0.3 10*3/uL (ref 0.0–0.7)
Eosinophils Relative: 5.3 % — ABNORMAL HIGH (ref 0.0–5.0)
HCT: 42.2 % (ref 36.0–46.0)
Hemoglobin: 13.8 g/dL (ref 12.0–15.0)
Lymphocytes Relative: 28.1 % (ref 12.0–46.0)
Lymphs Abs: 1.4 10*3/uL (ref 0.7–4.0)
MCHC: 32.7 g/dL (ref 30.0–36.0)
MCV: 94.2 fl (ref 78.0–100.0)
Monocytes Absolute: 0.3 10*3/uL (ref 0.1–1.0)
Monocytes Relative: 6.4 % (ref 3.0–12.0)
Neutro Abs: 2.8 10*3/uL (ref 1.4–7.7)
Neutrophils Relative %: 59 % (ref 43.0–77.0)
Platelets: 258 10*3/uL (ref 150.0–400.0)
RBC: 4.48 Mil/uL (ref 3.87–5.11)
RDW: 14.5 % (ref 11.5–15.5)
WBC: 4.8 10*3/uL (ref 4.0–10.5)

## 2022-01-11 LAB — HEMOGLOBIN A1C: Hgb A1c MFr Bld: 5.4 % (ref 4.6–6.5)

## 2022-01-11 LAB — TSH: TSH: 1.8 u[IU]/mL (ref 0.35–5.50)

## 2022-01-11 LAB — VITAMIN B12: Vitamin B-12: 224 pg/mL (ref 211–911)

## 2022-01-11 MED ORDER — ONDANSETRON 4 MG PO TBDP: 4.0000 mg | ORAL_TABLET | Freq: Three times a day (TID) | ORAL | 0 refills | Status: DC | PRN

## 2022-01-11 MED ORDER — VITAMIN D (ERGOCALCIFEROL) 1.25 MG (50000 UNIT) PO CAPS: 50000.0000 [IU] | ORAL_CAPSULE | ORAL | 0 refills | Status: AC

## 2022-01-11 NOTE — Patient Instructions (Signed)
-  Nice seeing you today!!  -Lab work today; will notify you once results are available.  -Tdap today.  -Consider your flu vaccine and COVID booster.  -See you back in 6 months or sooner as needed.

## 2022-01-11 NOTE — Progress Notes (Signed)
Established Patient Office Visit     This visit occurred during the SARS-CoV-2 public health emergency.  Safety protocols were in place, including screening questions prior to the visit, additional usage of staff PPE, and extensive cleaning of exam room while observing appropriate contact time as indicated for disinfecting solutions.    CC/Reason for Visit: Annual preventive exam  HPI: Barbara Yates is a 51 y.o. female who is coming in today for the above mentioned reasons. Past Medical History is significant for: Depression, insomnia, social anxiety, vitamin D deficiency, hyperlipidemia, fibromyalgia, genital herpes on chronic suppression with valacyclovir.  She also had abdominal abscess towards the end of 2021/2022.  Still of unclear source, questionable diverticular or tubo-ovarian abscess.  In any case she is largely asymptomatic at this time.  She has routine eye and dental care.  She is not exercising routinely.  She is overdue for flu and Tdap vaccines as well as COVID booster.  Her last mammogram was in 2022, she had a colonoscopy in March 2022 and a Pap smear in 2021.  She has no acute concerns or complaints today.   Past Medical/Surgical History: Past Medical History:  Diagnosis Date   Anxiety    Chronic back pain    Depression    Diverticulitis    Hyperlipidemia    Insomnia    Migraine headache    Myofascial pain     Past Surgical History:  Procedure Laterality Date   CERVICAL SPINE SURGERY     IR RADIOLOGIST EVAL & MGMT  11/17/2020   IR RADIOLOGIST EVAL & MGMT  12/31/2020   IR RADIOLOGIST EVAL & MGMT  01/07/2021   TUBAL LIGATION      Social History:  reports that she quit smoking about 23 years ago. Her smoking use included cigarettes. She started smoking about 26 years ago. She has a 0.75 pack-year smoking history. She has never used smokeless tobacco. She reports current alcohol use of about 4.0 standard drinks per week. She reports that she does not use  drugs.  Allergies: No Known Allergies  Family History:  Family History  Problem Relation Age of Onset   Colon polyps Mother    Hypertension Paternal Grandmother    CVA Paternal Grandmother    Alzheimer's disease Paternal Grandmother    Cancer Paternal Grandfather        Pancreatic   Pancreatic cancer Maternal Grandfather    Colon cancer Neg Hx    Esophageal cancer Neg Hx    Stomach cancer Neg Hx    Rectal cancer Neg Hx      Current Outpatient Medications:    acetaminophen (TYLENOL) 500 MG tablet, Take 1,000 mg by mouth every 6 (six) hours as needed for moderate pain., Disp: , Rfl:    ALPRAZolam (XANAX) 1 MG tablet, TAKE 1 TABLET BY MOUTH EVERY DAY AS NEEDED FOR ANXIETY, Disp: 30 tablet, Rfl: 0   atorvastatin (LIPITOR) 10 MG tablet, TAKE 1 TABLET BY MOUTH EVERY DAY, Disp: 90 tablet, Rfl: 0   citalopram (CELEXA) 40 MG tablet, Take 1 tablet (40 mg total) by mouth daily. APPT NEEDED FOR REFILLS, Disp: 30 tablet, Rfl: 0   cyclobenzaprine (FLEXERIL) 10 MG tablet, TAKE 1 TABLET BY MOUTH AT BEDTIME AS NEEDED FOR SPASMS, Disp: 20 tablet, Rfl: 0   ibuprofen (ADVIL) 200 MG tablet, Take 3 tablets (600 mg total) by mouth every 8 (eight) hours as needed (pain)., Disp: 30 tablet, Rfl: 0   valACYclovir (VALTREX) 1000 MG tablet, TAKE  1 TABLET BY MOUTH EVERY DAY, Disp: 90 tablet, Rfl: 0   zolpidem (AMBIEN) 10 MG tablet, TAKE 1 TABLET BY MOUTH AT BEDTIME AS NEEDED FOR SLEEP, Disp: 15 tablet, Rfl: 1   amLODipine (NORVASC) 5 MG tablet, TAKE 1 TABLET (5 MG TOTAL) BY MOUTH DAILY. (Patient not taking: No sig reported), Disp: 30 tablet, Rfl: 0   ondansetron (ZOFRAN ODT) 4 MG disintegrating tablet, Take 1 tablet (4 mg total) by mouth every 8 (eight) hours as needed for nausea or vomiting., Disp: 20 tablet, Rfl: 0  Review of Systems:  Constitutional: Denies fever, chills, diaphoresis, appetite change and fatigue.  HEENT: Denies photophobia, eye pain, redness, hearing loss, ear pain, congestion, sore throat,  rhinorrhea, sneezing, mouth sores, trouble swallowing, neck pain, neck stiffness and tinnitus.   Respiratory: Denies SOB, DOE, cough, chest tightness,  and wheezing.   Cardiovascular: Denies chest pain, palpitations and leg swelling.  Gastrointestinal: Denies nausea, vomiting, abdominal pain, diarrhea, constipation, blood in stool and abdominal distention.  Genitourinary: Denies dysuria, urgency, frequency, hematuria, flank pain and difficulty urinating.  Endocrine: Denies: hot or cold intolerance, sweats, changes in hair or nails, polyuria, polydipsia. Musculoskeletal: Denies myalgias, back pain, joint swelling, arthralgias and gait problem.  Skin: Denies pallor, rash and wound.  Neurological: Denies dizziness, seizures, syncope, weakness, light-headedness, numbness and headaches.  Hematological: Denies adenopathy. Easy bruising, personal or family bleeding history  Psychiatric/Behavioral: Denies suicidal ideation, mood changes, confusion, nervousness, sleep disturbance and agitation    Physical Exam: Vitals:   01/11/22 1010  BP: 110/80  Pulse: 80  Temp: 98 F (36.7 C)  TempSrc: Oral  SpO2: 100%  Weight: 227 lb 12.8 oz (103.3 kg)  Height: 5' 9.5" (1.765 m)    Body mass index is 33.16 kg/m.   Constitutional: NAD, calm, comfortable Eyes: PERRL, lids and conjunctivae normal ENMT: Mucous membranes are moist. Posterior pharynx clear of any exudate or lesions. Normal dentition. Tympanic membrane is pearly white, no erythema or bulging. Neck: normal, supple, no masses, no thyromegaly Respiratory: clear to auscultation bilaterally, no wheezing, no crackles. Normal respiratory effort. No accessory muscle use.  Cardiovascular: Regular rate and rhythm, no murmurs / rubs / gallops. No extremity edema. 2+ pedal pulses. No carotid bruits.  Abdomen: no tenderness, no masses palpated. No hepatosplenomegaly. Bowel sounds positive.  Musculoskeletal: no clubbing / cyanosis. No joint deformity  upper and lower extremities. Good ROM, no contractures. Normal muscle tone.  Skin: no rashes, lesions, ulcers. No induration Neurologic: CN 2-12 grossly intact. Sensation intact, DTR normal. Strength 5/5 in all 4.  Psychiatric: Normal judgment and insight. Alert and oriented x 3. Normal mood.    Impression and Plan:  Encounter for preventive health examination -Recommend routine eye and dental care. -Immunizations: Tdap today, she declines flu vaccine despite counseling, will consider COVID booster at pharmacy. -Healthy lifestyle discussed in detail. -Labs to be updated today. -Colon cancer screening: 02/2021 -Breast cancer screening: 01/2021, mammogram to be ordered per GYN's office per patient request -Cervical cancer screening: 01/2020 -Lung cancer screening: Not applicable -Prostate cancer screening: Not applicable -DEXA: Not applicable  Insomnia, unspecified type -With Ambien usage approximately 2 nights a week.  Vitamin D deficiency  - Plan: VITAMIN D 25 Hydroxy (Vit-D Deficiency, Fractures)  Anxiety -She has anxiety when speaking in public, she is prescribed alprazolam for this.  Hyperlipidemia, unspecified hyperlipidemia type  Aortic atherosclerosis - Plan: CBC with Differential/Platelet, Comprehensive metabolic panel, Lipid panel -She is currently on atorvastatin 10 mg daily. -With her aortic atherosclerosis would  like to keep LDL under 100.  Need for Tdap vaccination -Tdap administered today     Patient Instructions  -Nice seeing you today!!  -Lab work today; will notify you once results are available.  -Tdap today.  -Consider your flu vaccine and COVID booster.  -See you back in 6 months or sooner as needed.      Lelon Frohlich, MD [facility]

## 2022-01-11 NOTE — Addendum Note (Signed)
Addended by: Westley Hummer B on: 01/11/2022 11:18 AM   Modules accepted: Orders

## 2022-01-12 ENCOUNTER — Other Ambulatory Visit: Payer: Self-pay | Admitting: Internal Medicine

## 2022-01-12 DIAGNOSIS — E559 Vitamin D deficiency, unspecified: Secondary | ICD-10-CM

## 2022-01-16 ENCOUNTER — Other Ambulatory Visit: Payer: Self-pay | Admitting: Internal Medicine

## 2022-02-01 ENCOUNTER — Other Ambulatory Visit: Payer: Self-pay | Admitting: Internal Medicine

## 2022-02-09 ENCOUNTER — Other Ambulatory Visit: Payer: Self-pay | Admitting: Internal Medicine

## 2022-02-09 DIAGNOSIS — F419 Anxiety disorder, unspecified: Secondary | ICD-10-CM

## 2022-02-09 DIAGNOSIS — G4700 Insomnia, unspecified: Secondary | ICD-10-CM

## 2022-02-16 ENCOUNTER — Other Ambulatory Visit: Payer: Self-pay | Admitting: Internal Medicine

## 2022-02-16 DIAGNOSIS — E785 Hyperlipidemia, unspecified: Secondary | ICD-10-CM

## 2022-04-02 ENCOUNTER — Other Ambulatory Visit: Payer: Self-pay | Admitting: Internal Medicine

## 2022-04-02 DIAGNOSIS — E559 Vitamin D deficiency, unspecified: Secondary | ICD-10-CM

## 2022-04-04 ENCOUNTER — Other Ambulatory Visit: Payer: Self-pay | Admitting: Internal Medicine

## 2022-04-18 ENCOUNTER — Other Ambulatory Visit: Payer: Self-pay | Admitting: Internal Medicine

## 2022-04-18 DIAGNOSIS — F419 Anxiety disorder, unspecified: Secondary | ICD-10-CM

## 2022-05-15 ENCOUNTER — Other Ambulatory Visit: Payer: Self-pay | Admitting: Internal Medicine

## 2022-05-15 DIAGNOSIS — G4700 Insomnia, unspecified: Secondary | ICD-10-CM

## 2022-06-17 ENCOUNTER — Other Ambulatory Visit: Payer: Self-pay | Admitting: Internal Medicine

## 2022-06-20 NOTE — Telephone Encounter (Signed)
Rx done. 

## 2022-06-30 ENCOUNTER — Other Ambulatory Visit: Payer: Self-pay | Admitting: Internal Medicine

## 2022-06-30 DIAGNOSIS — F419 Anxiety disorder, unspecified: Secondary | ICD-10-CM

## 2022-07-11 ENCOUNTER — Encounter (HOSPITAL_COMMUNITY): Payer: Self-pay | Admitting: Emergency Medicine

## 2022-07-11 ENCOUNTER — Emergency Department (HOSPITAL_COMMUNITY): Payer: BC Managed Care – PPO

## 2022-07-11 ENCOUNTER — Ambulatory Visit (INDEPENDENT_AMBULATORY_CARE_PROVIDER_SITE_OTHER): Payer: BC Managed Care – PPO | Admitting: Internal Medicine

## 2022-07-11 ENCOUNTER — Telehealth: Payer: Self-pay | Admitting: Internal Medicine

## 2022-07-11 ENCOUNTER — Emergency Department (HOSPITAL_COMMUNITY)
Admission: EM | Admit: 2022-07-11 | Discharge: 2022-07-11 | Disposition: A | Discharge status: Home / Self Care | Payer: BC Managed Care – PPO | Attending: Emergency Medicine | Admitting: Emergency Medicine

## 2022-07-11 ENCOUNTER — Encounter: Payer: Self-pay | Admitting: Internal Medicine

## 2022-07-11 VITALS — BP 174/124 | HR 81 | Temp 98.1°F | Wt 229.3 lb

## 2022-07-11 DIAGNOSIS — Z79899 Other long term (current) drug therapy: Secondary | ICD-10-CM | POA: Diagnosis not present

## 2022-07-11 DIAGNOSIS — G4700 Insomnia, unspecified: Secondary | ICD-10-CM

## 2022-07-11 DIAGNOSIS — I161 Hypertensive emergency: Secondary | ICD-10-CM

## 2022-07-11 DIAGNOSIS — I10 Essential (primary) hypertension: Secondary | ICD-10-CM | POA: Diagnosis not present

## 2022-07-11 LAB — BASIC METABOLIC PANEL
Anion gap: 7 (ref 5–15)
BUN: 8 mg/dL (ref 6–20)
CO2: 27 mmol/L (ref 22–32)
Calcium: 8.9 mg/dL (ref 8.9–10.3)
Chloride: 106 mmol/L (ref 98–111)
Creatinine, Ser: 1.05 mg/dL — ABNORMAL HIGH (ref 0.44–1.00)
GFR, Estimated: >60 mL/min (ref >60)
Glucose, Bld: 110 mg/dL — ABNORMAL HIGH (ref 70–99)
Potassium: 3.9 mmol/L (ref 3.5–5.1)
Sodium: 140 mmol/L (ref 135–145)

## 2022-07-11 LAB — CBC
HCT: 41.4 % (ref 36.0–46.0)
Hemoglobin: 13.8 g/dL (ref 12.0–15.0)
MCH: 31.2 pg (ref 26.0–34.0)
MCHC: 33.3 g/dL (ref 30.0–36.0)
MCV: 93.7 fL (ref 80.0–100.0)
Platelets: 294 10*3/uL (ref 150–400)
RBC: 4.42 MIL/uL (ref 3.87–5.11)
RDW: 13.2 % (ref 11.5–15.5)
WBC: 4.8 10*3/uL (ref 4.0–10.5)
nRBC: 0 % (ref 0.0–0.2)

## 2022-07-11 LAB — TROPONIN I (HIGH SENSITIVITY)
Troponin I (High Sensitivity): 3 ng/L (ref <18)
Troponin I (High Sensitivity): 2 ng/L (ref <18)

## 2022-07-11 LAB — I-STAT BETA HCG BLOOD, ED (MC, WL, AP ONLY): I-stat hCG, quantitative: <5 m[IU]/mL (ref <5)

## 2022-07-11 MED ORDER — AMLODIPINE BESYLATE 5 MG PO TABS: ORAL_TABLET | Freq: Every day | ORAL | 0 refills | Status: DC

## 2022-07-11 MED ORDER — AMLODIPINE BESYLATE 5 MG PO TABS
5.0000 mg | ORAL_TABLET | Freq: Once | ORAL | Status: AC
  Administered 2022-07-11: 5 mg via ORAL
  Filled 2022-07-11: qty 1

## 2022-07-11 NOTE — Telephone Encounter (Signed)
Pt has been at [facility] ER all day and BP is still pretty high... Although it went down to 164/114,  it is now back up to 180/110.  Pt is being prescribed amlodipine in the ER and she said she is already taking that.  Pt is very worried and would like advise from MD.   Pt was offered a hospital FU appointment, but I informed her that I had to offer her another MD, since MD has no availability.  Pt respectfully declined and asked that MD give her a call. Please call her.  [phone]

## 2022-07-11 NOTE — Discharge Instructions (Addendum)
Your work-up today was reassuring, however your blood pressure was elevated.  Please take the amlodipine as directed.  Make sure to follow-up with your primary care doctor again in the next few days.  Please return to the ER if you have worsening chest pain, headache, nausea, vomiting, blurry vision or any other new or concerning symptoms.

## 2022-07-11 NOTE — Telephone Encounter (Signed)
Patient is aware.  Follow up scheduled

## 2022-07-11 NOTE — ED Triage Notes (Signed)
Patient states she was sent to ED from PCP for evaluation of hypertension, denies history of hypertension. Patient denies chest pain, denies dizziness, denies shortness of breath. Patient reports history of anxiety and states she has felt more anxious than normal over the last several weeks and states she is planning a wedding and completing home renovations and thinks it could be related.

## 2022-07-11 NOTE — Progress Notes (Signed)
Established Patient Office Visit     CC/Reason for Visit: Discuss acute concerns  HPI: Barbara Yates is a 51 y.o. female who is coming in today for the above mentioned reasons. Past Medical History is significant for: Depression, insomnia, social anxiety, vitamin D deficiency, hyperlipidemia, fibromyalgia, genital herpes on chronic suppression with valacyclovir.  She also had abdominal abscess towards the end of 2021/2022.  Still of unclear source, questionable diverticular or tubo-ovarian abscess.  For the past 2 weeks she has just been feeling "funny".  She has been getting a nervous feeling in her stomach "kind of like butterflies".  Has had some nausea but no vomiting.  She gets a little short of breath with ambulation.  She feels a heavy sensation on her chest like a load of bricks is sitting there.  She is not known to be hypertensive.  During the work-up for her abdominal abscess she was noted incidentally to have atherosclerosis of the aorta and was placed on atorvastatin low-dose.   Past Medical/Surgical History: Past Medical History:  Diagnosis Date   Anxiety    Chronic back pain    Depression    Diverticulitis    Hyperlipidemia    Insomnia    Migraine headache    Myofascial pain     Past Surgical History:  Procedure Laterality Date   CERVICAL SPINE SURGERY     IR RADIOLOGIST EVAL & MGMT  11/17/2020   IR RADIOLOGIST EVAL & MGMT  12/31/2020   IR RADIOLOGIST EVAL & MGMT  01/07/2021   TUBAL LIGATION      Social History:  reports that she quit smoking about 23 years ago. Her smoking use included cigarettes. She started smoking about 26 years ago. She has a 0.75 pack-year smoking history. She has never used smokeless tobacco. She reports current alcohol use of about 4.0 standard drinks of alcohol per week. She reports that she does not use drugs.  Allergies: No Known Allergies  Family History:  Family History  Problem Relation Age of Onset   Colon polyps Mother     Hypertension Paternal Grandmother    CVA Paternal Grandmother    Alzheimer's disease Paternal Grandmother    Cancer Paternal Grandfather        Pancreatic   Pancreatic cancer Maternal Grandfather    Colon cancer Neg Hx    Esophageal cancer Neg Hx    Stomach cancer Neg Hx    Rectal cancer Neg Hx      Current Outpatient Medications:    acetaminophen (TYLENOL) 500 MG tablet, Take 1,000 mg by mouth every 6 (six) hours as needed for moderate pain., Disp: , Rfl:    ALPRAZolam (XANAX) 1 MG tablet, TAKE 1 TABLET BY MOUTH EVERY DAY AS NEEDED FOR ANXIETY, Disp: 30 tablet, Rfl: 0   atorvastatin (LIPITOR) 10 MG tablet, TAKE 1 TABLET BY MOUTH EVERY DAY, Disp: 90 tablet, Rfl: 1   citalopram (CELEXA) 40 MG tablet, TAKE 1 TABLET (40 MG TOTAL) BY MOUTH DAILY. APPT NEEDED FOR REFILLS, Disp: 90 tablet, Rfl: 1   cyclobenzaprine (FLEXERIL) 10 MG tablet, TAKE 1 TABLET BY MOUTH AT BEDTIME AS NEEDED FOR SPASMS, Disp: 20 tablet, Rfl: 0   ibuprofen (ADVIL) 200 MG tablet, Take 3 tablets (600 mg total) by mouth every 8 (eight) hours as needed (pain)., Disp: 30 tablet, Rfl: 0   ondansetron (ZOFRAN ODT) 4 MG disintegrating tablet, Take 1 tablet (4 mg total) by mouth every 8 (eight) hours as needed for nausea or  vomiting., Disp: 20 tablet, Rfl: 0   valACYclovir (VALTREX) 1000 MG tablet, TAKE 1 TABLET BY MOUTH EVERY DAY, Disp: 90 tablet, Rfl: 0   zolpidem (AMBIEN) 10 MG tablet, TAKE 1 TABLET BY MOUTH AT BEDTIME AS NEEDED FOR SLEEP, Disp: 15 tablet, Rfl: 1   amLODipine (NORVASC) 5 MG tablet, TAKE 1 TABLET (5 MG TOTAL) BY MOUTH DAILY. (Patient not taking: No sig reported), Disp: 30 tablet, Rfl: 0  Review of Systems:  Constitutional: Denies fever, chills, diaphoresis, appetite change and fatigue.  HEENT: Denies photophobia, eye pain, redness, hearing loss, ear pain, congestion, sore throat, rhinorrhea, sneezing, mouth sores, trouble swallowing, neck pain, neck stiffness and tinnitus.   Respiratory: Denies cough,   and  wheezing.   Cardiovascular: Denies  palpitations and leg swelling.  Gastrointestinal: Denies nausea, vomiting, abdominal pain, diarrhea, constipation, blood in stool and abdominal distention.  Genitourinary: Denies dysuria, urgency, frequency, hematuria, flank pain and difficulty urinating.  Endocrine: Denies: hot or cold intolerance, sweats, changes in hair or nails, polyuria, polydipsia. Musculoskeletal: Denies myalgias, back pain, joint swelling, arthralgias and gait problem.  Skin: Denies pallor, rash and wound.  Neurological: Denies dizziness, seizures, syncope, weakness, light-headedness, numbness and headaches.  Hematological: Denies adenopathy. Easy bruising, personal or family bleeding history  Psychiatric/Behavioral: Denies suicidal ideation, mood changes, confusion, nervousness, sleep disturbance and agitation    Physical Exam: Vitals:   07/11/22 0906 07/11/22 0910 07/11/22 0930  BP: (!) 150/120 (!) 182/120 (!) 174/124  Pulse: 81    Temp: 98.1 F (36.7 C)    TempSrc: Oral    SpO2: 99%    Weight: 229 lb 4.8 oz (104 kg)      Body mass index is 33.38 kg/m.   Constitutional: NAD, calm, comfortable Eyes: PERRL, lids and conjunctivae normal ENMT: Mucous membranes are moist. Respiratory: clear to auscultation bilaterally, no wheezing, no crackles. Normal respiratory effort. No accessory muscle use.  Cardiovascular: Regular rate and rhythm, no murmurs / rubs / gallops. No extremity edema. Psychiatric: Normal judgment and insight. Alert and oriented x 3. Normal mood.    Impression and Plan:  Hypertensive emergency -Given her elevated blood pressure in office, especially diastolic hypertension, I would consider this to be a hypertensive emergency.  I am concerned about the possibility of a heart event given her symptoms.  I considered doing an EKG in office, however I believe we need to expedite her work-up so I have recommended ED evaluation today.  We have recommended EMS  services but she has declined.    Time spent:33 minutes reviewing chart, interviewing and examining patient and formulating plan of care.    Lelon Frohlich, MD [facility]

## 2022-07-11 NOTE — ED Provider Notes (Signed)
[facility] EMERGENCY DEPARTMENT Provider Note   CSN: [id number] Arrival date & time: 07/11/22  0945     History  Chief Complaint  Patient presents with   Hypertension    Barbara Yates is a 51 y.o. female.  HPI 51 year old female with a history of chronic back pain, depression, hyperlipidemia, anxiety presents to the ER after being sent from her PCP for evaluation of hypertension.  Patient denies any history of hypertension.  States that she has been having a lot of anxiety and works a stressful job along with planning a wedding and has been stressed.  She has been having not which she would qualify as "chest pain", but chest heaviness and sometimes burning.  She brought these concerns to her PCP, and was noted to have a blood pressure of 174/124 and was sent to the ER for evaluation of possible hypertensive emergency.  The patient denies any headache, blurry vision, near syncope/syncope.  She has no chest pain at this time.  She reports briefly getting started on amlodipine as she had high blood pressures while she was hospitalized for a pelvic abscess back in 2022 however when she was discharged her blood pressure returned to normal and she stopped this medication.  She has no other cardiac history.    Home Medications Prior to Admission medications   Medication Sig Start Date End Date Taking? Authorizing Provider  acetaminophen (TYLENOL) 500 MG tablet Take 1,000 mg by mouth every 6 (six) hours as needed for moderate pain.    [provider]  ALPRAZolam Duanne Moron) 1 MG tablet TAKE 1 TABLET BY MOUTH EVERY DAY AS NEEDED FOR ANXIETY 06/30/22   Isaac Bliss, Rayford Halsted, MD  amLODipine (NORVASC) 5 MG tablet TAKE 1 TABLET (5 MG TOTAL) BY MOUTH DAILY. 07/11/22 07/11/23  Sharyn Lull A, PA-C  atorvastatin (LIPITOR) 10 MG tablet TAKE 1 TABLET BY MOUTH EVERY DAY 02/16/22   Isaac Bliss, Rayford Halsted, MD  citalopram (CELEXA) 40 MG tablet TAKE 1 TABLET (40 MG TOTAL) BY MOUTH  DAILY. APPT NEEDED FOR REFILLS 01/17/22   Isaac Bliss, Rayford Halsted, MD  cyclobenzaprine (FLEXERIL) 10 MG tablet TAKE 1 TABLET BY MOUTH AT BEDTIME AS NEEDED FOR SPASMS 06/20/22   Burchette, Alinda Sierras, MD  ibuprofen (ADVIL) 200 MG tablet Take 3 tablets (600 mg total) by mouth every 8 (eight) hours as needed (pain). 12/17/20   Saverio Danker, PA-C  ondansetron (ZOFRAN ODT) 4 MG disintegrating tablet Take 1 tablet (4 mg total) by mouth every 8 (eight) hours as needed for nausea or vomiting. 01/11/22   Isaac Bliss, Rayford Halsted, MD  valACYclovir (VALTREX) 1000 MG tablet TAKE 1 TABLET BY MOUTH EVERY DAY 10/06/21   Isaac Bliss, Rayford Halsted, MD  zolpidem (AMBIEN) 10 MG tablet TAKE 1 TABLET BY MOUTH AT BEDTIME AS NEEDED FOR SLEEP 05/16/22   Isaac Bliss, Rayford Halsted, MD      Allergies    Patient has no known allergies.    Review of Systems   Review of Systems Ten systems reviewed and are negative for acute change, except as noted in the HPI.   Physical Exam Updated Vital Signs BP (!) 180/106 (BP Location: Left Arm)   Pulse 66   Temp 97.8 F (36.6 C) (Oral)   Resp 20   LMP 06/20/2022 (Approximate)   SpO2 98%  Physical Exam Vitals and nursing note reviewed.  Constitutional:      General: She is not in acute distress.    Appearance: She  is well-developed.  HENT:     Head: Normocephalic and atraumatic.  Eyes:     Conjunctiva/sclera: Conjunctivae normal.  Cardiovascular:     Rate and Rhythm: Normal rate and regular rhythm.     Heart sounds: No murmur heard. Pulmonary:     Effort: Pulmonary effort is normal. No respiratory distress.     Breath sounds: Normal breath sounds.  Abdominal:     Palpations: Abdomen is soft.     Tenderness: There is no abdominal tenderness.  Musculoskeletal:        General: No swelling.     Cervical back: Neck supple.  Skin:    General: Skin is warm and dry.     Capillary Refill: Capillary refill takes less than 2 seconds.  Neurological:     Mental Status: She  is alert.  Psychiatric:        Mood and Affect: Mood normal.     ED Results / Procedures / Treatments   Labs (all labs ordered are listed, but only abnormal results are displayed) Labs Reviewed  BASIC METABOLIC PANEL - Abnormal; Notable for the following components:      Result Value   Glucose, Bld 110 (*)    Creatinine, Ser 1.05 (*)    All other components within normal limits  CBC  I-STAT BETA HCG BLOOD, ED ([facility], [facility], [facility] ONLY)  TROPONIN I (HIGH SENSITIVITY)  TROPONIN I (HIGH SENSITIVITY)    EKG EKG Interpretation  Date/Time:  Monday July 11 2022 09:58:56 EDT Ventricular Rate:  81 PR Interval:  154 QRS Duration: 78 QT Interval:  368 QTC Calculation: 427 R Axis:   63 Text Interpretation: Normal sinus rhythm Confirmed by Godfrey Pick ([id number]) on 07/11/2022 1:06:55 PM  Radiology DG Chest 1 View  Result Date: 07/11/2022 CLINICAL DATA:  Hypertension EXAM: CHEST  1 VIEW COMPARISON:  None Available. FINDINGS: The heart size and mediastinal contours are within normal limits. Both lungs are clear. No pleural effusion or pneumothorax. The visualized skeletal structures are unremarkable. IMPRESSION: No active disease. Electronically Signed   By: Macy Mis M.D.   On: 07/11/2022 12:24    Procedures Procedures    Medications Ordered in ED Medications  amLODipine (NORVASC) tablet 5 mg (has no administration in time range)    ED Course/ Medical Decision Making/ A&P                           Medical Decision Making Amount and/or Complexity of Data Reviewed Labs: ordered. Radiology: ordered.   This patient presents to the ED for concern of hypertension, this involves a number of treatment options, and is a complaint that carries with it a high risk of complications and morbidity.  The differential diagnosis includes asymptomatic hypertension, hypertensive urgency/emergency   Co morbidities: Discussed in HPI   Brief History:  51 year old female presenting with elevated  blood pressures and occasional chest discomfort.  None at present.  No prior history of hypertension however she was started on a short course of amlodipine last year during hospitalization but her blood pressure improved on its own and this medication was discontinued.  She denies any symptoms at present.  No headache, nausea, vomiting, blurry vision, chest pain, abdominal pain.  EMR reviewed including pt PMHx, past surgical history and past visits to ER.   See HPI for more details   Lab Tests:  I ordered and independently interpreted labs.  The pertinent results include:    I personally  reviewed all laboratory work and imaging. Metabolic panel without any acute abnormality specifically kidney function within normal limits and no significant electrolyte abnormalities. CBC without leukocytosis or significant anemia. Her creatinine is mildly elevated at 1.05, likely suspect secondary to dehydration.  Has been slightly elevated in the past per chart review.  Imaging Studies:  NAD. I personally reviewed all imaging studies and no acute abnormality found. I agree with radiology interpretation.  Chest x-ray without any abnormalities noted  Cardiac Monitoring:  The patient was maintained on a cardiac monitor.  I personally viewed and interpreted the cardiac monitored which showed an underlying rhythm of: EKG non-ischemic   Medicines ordered:  NA   Critical Interventions:  NA   Consults:  NA    Reevaluation:  After the interventions noted above I re-evaluated patient and found that they have :improved   Social Determinants of Health:  NA    Problem List / ED Course:  51 year old female presented to the ER with concerns of hypertensive urgency from her PCP.  On arrival, she is well-appearing, no acute distress, resting comfortably in the ER bed. Her blood pressure on my evaluation was 165/114, which was improved from 185/121.  Pressures from 6 months ago appear to be  110/80.  She does report increased stress with anxiety and recently planning a wedding.  She also states that she has been having a lot of stress at work.  She has no chest pain at this time.  Her work-up is overall reassuring, CBC unremarkable, BMP with a slightly elevated creatinine 1.05 likely secondary to dehydration.  No evidence of AKI.  Delta troponins negative.  Chest x-ray unremarkable.  EKG normal sinus rhythm.  She has no headache, no indication for CT of the head at present.  This is patient for subarachnoid hemorrhage.  No evidence of AKI.  Troponins are unremarkable.  Given this, I have low suspicion for hypertensive urgency/emergency.  I have low suspicion for a dissection, PE, ACS.  Given she has been on amlodipine, I think it is reasonable to start her on this.    Repeat BP 180/106. Will give dose of Amlodipine.    No signs of hypertensive urgency/emergency. Will send home w/ amlodipine script and strict PCP followup. Discussed return precautions. She voiced understanding and is agreeable.  Admission considered however given no other signs of hypertensive urgency/emergency, the patient is stable for discharge with p.o. meds and strict PCP follow-up.  Dispostion:  After consideration of the diagnostic results and the patients response to treatment, I feel that the patent would benefit from discharge with close PCP follow-up and strict return precautions.   Final Clinical Impression(s) / ED Diagnoses Final diagnoses:  Hypertension, unspecified type    Rx / DC Orders ED Discharge Orders          Ordered    amLODipine (NORVASC) 5 MG tablet  Daily        07/11/22 1516              Lyndel Safe 07/11/22 1519    Godfrey Pick, MD 07/11/22 1909

## 2022-07-25 ENCOUNTER — Ambulatory Visit: Payer: BC Managed Care – PPO | Admitting: Internal Medicine

## 2022-08-03 ENCOUNTER — Encounter: Payer: Self-pay | Admitting: Internal Medicine

## 2022-08-03 ENCOUNTER — Ambulatory Visit (INDEPENDENT_AMBULATORY_CARE_PROVIDER_SITE_OTHER): Payer: BC Managed Care – PPO | Admitting: Internal Medicine

## 2022-08-03 VITALS — BP 150/106 | HR 82 | Temp 98.1°F | Wt 230.1 lb

## 2022-08-03 DIAGNOSIS — Z09 Encounter for follow-up examination after completed treatment for conditions other than malignant neoplasm: Secondary | ICD-10-CM

## 2022-08-03 DIAGNOSIS — I10 Essential (primary) hypertension: Secondary | ICD-10-CM

## 2022-08-03 MED ORDER — VALSARTAN-HYDROCHLOROTHIAZIDE 160-25 MG PO TABS: 1.0000 | ORAL_TABLET | Freq: Every day | ORAL | 1 refills | Status: DC

## 2022-08-03 NOTE — Progress Notes (Signed)
Established Patient Office Visit     CC/Reason for Visit: Follow-up blood pressure, ED follow-up  HPI: Barbara Yates is a 51 y.o. female who is coming in today for the above mentioned reasons.  She was seen in clinic in early August and had a diastolic of 161 with some chest pressure, was sent to the emergency department with concerns for hypertensive urgency.  Cardiac work-up was negative.  She improved after a dose of amlodipine and was discharged home.  She has been adherent to medical therapy since then.  Blood pressure remains elevated although improved from 3 weeks ago.  She is feeling well, has no chest pain, no shortness of breath, no headaches, no focal neurologic deficits.  Past Medical/Surgical History: Past Medical History:  Diagnosis Date   Anxiety    Chronic back pain    Depression    Diverticulitis    Hyperlipidemia    Insomnia    Migraine headache    Myofascial pain     Past Surgical History:  Procedure Laterality Date   CERVICAL SPINE SURGERY     IR RADIOLOGIST EVAL & MGMT  11/17/2020   IR RADIOLOGIST EVAL & MGMT  12/31/2020   IR RADIOLOGIST EVAL & MGMT  01/07/2021   TUBAL LIGATION      Social History:  reports that she quit smoking about 23 years ago. Her smoking use included cigarettes. She started smoking about 26 years ago. She has a 0.75 pack-year smoking history. She has never used smokeless tobacco. She reports current alcohol use of about 4.0 standard drinks of alcohol per week. She reports that she does not use drugs.  Allergies: No Known Allergies  Family History:  Family History  Problem Relation Age of Onset   Colon polyps Mother    Hypertension Paternal Grandmother    CVA Paternal Grandmother    Alzheimer's disease Paternal Grandmother    Cancer Paternal Grandfather        Pancreatic   Pancreatic cancer Maternal Grandfather    Colon cancer Neg Hx    Esophageal cancer Neg Hx    Stomach cancer Neg Hx    Rectal cancer Neg Hx       Current Outpatient Medications:    acetaminophen (TYLENOL) 500 MG tablet, Take 1,000 mg by mouth every 6 (six) hours as needed for moderate pain., Disp: , Rfl:    ALPRAZolam (XANAX) 1 MG tablet, TAKE 1 TABLET BY MOUTH EVERY DAY AS NEEDED FOR ANXIETY, Disp: 30 tablet, Rfl: 0   amLODipine (NORVASC) 5 MG tablet, TAKE 1 TABLET (5 MG TOTAL) BY MOUTH DAILY., Disp: 30 tablet, Rfl: 0   atorvastatin (LIPITOR) 10 MG tablet, TAKE 1 TABLET BY MOUTH EVERY DAY, Disp: 90 tablet, Rfl: 1   citalopram (CELEXA) 40 MG tablet, TAKE 1 TABLET (40 MG TOTAL) BY MOUTH DAILY. APPT NEEDED FOR REFILLS, Disp: 90 tablet, Rfl: 1   cyclobenzaprine (FLEXERIL) 10 MG tablet, TAKE 1 TABLET BY MOUTH AT BEDTIME AS NEEDED FOR SPASMS, Disp: 20 tablet, Rfl: 0   ibuprofen (ADVIL) 200 MG tablet, Take 3 tablets (600 mg total) by mouth every 8 (eight) hours as needed (pain)., Disp: 30 tablet, Rfl: 0   ondansetron (ZOFRAN ODT) 4 MG disintegrating tablet, Take 1 tablet (4 mg total) by mouth every 8 (eight) hours as needed for nausea or vomiting., Disp: 20 tablet, Rfl: 0   valACYclovir (VALTREX) 1000 MG tablet, TAKE 1 TABLET BY MOUTH EVERY DAY, Disp: 90 tablet, Rfl: 0   valsartan-hydrochlorothiazide (  DIOVAN-HCT) 160-25 MG tablet, Take 1 tablet by mouth daily., Disp: 90 tablet, Rfl: 1   zolpidem (AMBIEN) 10 MG tablet, TAKE 1 TABLET BY MOUTH AT BEDTIME AS NEEDED FOR SLEEP, Disp: 15 tablet, Rfl: 1  Review of Systems:  Constitutional: Denies fever, chills, diaphoresis, appetite change and fatigue.  HEENT: Denies photophobia, eye pain, redness, hearing loss, ear pain, congestion, sore throat, rhinorrhea, sneezing, mouth sores, trouble swallowing, neck pain, neck stiffness and tinnitus.   Respiratory: Denies SOB, DOE, cough, chest tightness,  and wheezing.   Cardiovascular: Denies chest pain, palpitations and leg swelling.  Gastrointestinal: Denies nausea, vomiting, abdominal pain, diarrhea, constipation, blood in stool and abdominal  distention.  Genitourinary: Denies dysuria, urgency, frequency, hematuria, flank pain and difficulty urinating.  Endocrine: Denies: hot or cold intolerance, sweats, changes in hair or nails, polyuria, polydipsia. Musculoskeletal: Denies myalgias, back pain, joint swelling, arthralgias and gait problem.  Skin: Denies pallor, rash and wound.  Neurological: Denies dizziness, seizures, syncope, weakness, light-headedness, numbness and headaches.  Hematological: Denies adenopathy. Easy bruising, personal or family bleeding history  Psychiatric/Behavioral: Denies suicidal ideation, mood changes, confusion, nervousness, sleep disturbance and agitation    Physical Exam: Vitals:   08/03/22 0835 08/03/22 0839  BP: (!) 140/98 (!) 150/106  Pulse: 82   Temp: 98.1 F (36.7 C)   TempSrc: Other (Comment)   SpO2: 99%   Weight: 230 lb 1.6 oz (104.4 kg)     Body mass index is 33.49 kg/m.   Constitutional: NAD, calm, comfortable Eyes: PERRL, lids and conjunctivae normal ENMT: Mucous membranes are moist.  Respiratory: clear to auscultation bilaterally, no wheezing, no crackles. Normal respiratory effort. No accessory muscle use.  Cardiovascular: Regular rate and rhythm, no murmurs / rubs / gallops. No extremity edema.  Psychiatric: Normal judgment and insight. Alert and oriented x 3. Normal mood.    Impression and Plan:  Hospital discharge follow-up  Primary hypertension - Plan: valsartan-hydrochlorothiazide (DIOVAN-HCT) 160-25 MG tablet  [facility] charts have been reviewed in detail.  Cardiac work-up was negative. -She was started on amlodipine 5 mg.  She has been on it for about 3-1/2 weeks.  In office blood pressure measurements remain elevated at 140/98, 150/100. -Start Diovan HCT 160/25 mg.  Return in 6 weeks for follow-up.   Time spent:32 minutes reviewing chart, interviewing and examining patient and formulating plan of care.      Lelon Frohlich, MD [facility]

## 2022-08-25 ENCOUNTER — Other Ambulatory Visit: Payer: Self-pay | Admitting: Family Medicine

## 2022-08-30 ENCOUNTER — Other Ambulatory Visit: Payer: Self-pay | Admitting: Internal Medicine

## 2022-08-30 DIAGNOSIS — F419 Anxiety disorder, unspecified: Secondary | ICD-10-CM

## 2022-09-15 ENCOUNTER — Other Ambulatory Visit: Payer: Self-pay | Admitting: Internal Medicine

## 2022-09-15 DIAGNOSIS — G4700 Insomnia, unspecified: Secondary | ICD-10-CM

## 2022-09-19 ENCOUNTER — Other Ambulatory Visit: Payer: Self-pay | Admitting: Internal Medicine

## 2022-09-19 DIAGNOSIS — A6000 Herpesviral infection of urogenital system, unspecified: Secondary | ICD-10-CM

## 2022-09-21 ENCOUNTER — Ambulatory Visit: Payer: BC Managed Care – PPO | Admitting: Internal Medicine

## 2022-10-05 ENCOUNTER — Encounter: Payer: Self-pay | Admitting: Internal Medicine

## 2022-10-05 ENCOUNTER — Ambulatory Visit (INDEPENDENT_AMBULATORY_CARE_PROVIDER_SITE_OTHER): Payer: BC Managed Care – PPO | Admitting: Internal Medicine

## 2022-10-05 VITALS — BP 163/103 | HR 86 | Temp 98.4°F | Wt 230.3 lb

## 2022-10-05 DIAGNOSIS — I10 Essential (primary) hypertension: Secondary | ICD-10-CM | POA: Diagnosis not present

## 2022-10-05 DIAGNOSIS — F419 Anxiety disorder, unspecified: Secondary | ICD-10-CM | POA: Diagnosis not present

## 2022-10-05 MED ORDER — AMLODIPINE BESYLATE 5 MG PO TABS: ORAL_TABLET | Freq: Every day | ORAL | 1 refills | Status: DC

## 2022-10-05 MED ORDER — CITALOPRAM HYDROBROMIDE 40 MG PO TABS: 40.0000 mg | ORAL_TABLET | Freq: Every day | ORAL | 1 refills | Status: DC

## 2022-10-05 NOTE — Progress Notes (Signed)
Established Patient Office Visit     CC/Reason for Visit: Blood pressure follow-up  HPI: Barbara Yates is a 51 y.o. female who is coming in today for the above mentioned reasons.  She is here today to follow-up on blood pressure.  At last visit we added Diovan HCT 160/25 mg to her amlodipine 5 mg.  It appears that she misunderstood and has only been taking the Diovan HCT and discontinued the amlodipine.  She has been under a lot of stress.  Her blood pressure is elevated in office.  She states that at home her average blood pressure is around 135/100.   Past Medical/Surgical History: Past Medical History:  Diagnosis Date   Anxiety    Chronic back pain    Depression    Diverticulitis    Hyperlipidemia    Insomnia    Migraine headache    Myofascial pain     Past Surgical History:  Procedure Laterality Date   CERVICAL SPINE SURGERY     IR RADIOLOGIST EVAL & MGMT  11/17/2020   IR RADIOLOGIST EVAL & MGMT  12/31/2020   IR RADIOLOGIST EVAL & MGMT  01/07/2021   TUBAL LIGATION      Social History:  reports that she quit smoking about 24 years ago. Her smoking use included cigarettes. She started smoking about 27 years ago. She has a 0.75 pack-year smoking history. She has never used smokeless tobacco. She reports current alcohol use of about 4.0 standard drinks of alcohol per week. She reports that she does not use drugs.  Allergies: No Known Allergies  Family History:  Family History  Problem Relation Age of Onset   Colon polyps Mother    Hypertension Paternal Grandmother    CVA Paternal Grandmother    Alzheimer's disease Paternal Grandmother    Cancer Paternal Grandfather        Pancreatic   Pancreatic cancer Maternal Grandfather    Colon cancer Neg Hx    Esophageal cancer Neg Hx    Stomach cancer Neg Hx    Rectal cancer Neg Hx      Current Outpatient Medications:    acetaminophen (TYLENOL) 500 MG tablet, Take 1,000 mg by mouth every 6 (six) hours as needed  for moderate pain., Disp: , Rfl:    ALPRAZolam (XANAX) 1 MG tablet, TAKE 1 TABLET BY MOUTH EVERY DAY AS NEEDED FOR ANXIETY, Disp: 30 tablet, Rfl: 0   atorvastatin (LIPITOR) 10 MG tablet, TAKE 1 TABLET BY MOUTH EVERY DAY, Disp: 90 tablet, Rfl: 1   cyclobenzaprine (FLEXERIL) 10 MG tablet, TAKE 1 TABLET BY MOUTH AT BEDTIME AS NEEDED FOR SPASMS, Disp: 20 tablet, Rfl: 0   ibuprofen (ADVIL) 200 MG tablet, Take 3 tablets (600 mg total) by mouth every 8 (eight) hours as needed (pain)., Disp: 30 tablet, Rfl: 0   ondansetron (ZOFRAN ODT) 4 MG disintegrating tablet, Take 1 tablet (4 mg total) by mouth every 8 (eight) hours as needed for nausea or vomiting., Disp: 20 tablet, Rfl: 0   valACYclovir (VALTREX) 1000 MG tablet, TAKE 1 TABLET BY MOUTH EVERY DAY, Disp: 90 tablet, Rfl: 0   valsartan-hydrochlorothiazide (DIOVAN-HCT) 160-25 MG tablet, Take 1 tablet by mouth daily., Disp: 90 tablet, Rfl: 1   zolpidem (AMBIEN) 10 MG tablet, TAKE 1 TABLET BY MOUTH EVERY DAY AT BEDTIME AS NEEDED FOR SLEEP, Disp: 15 tablet, Rfl: 1   amLODipine (NORVASC) 5 MG tablet, TAKE 1 TABLET (5 MG TOTAL) BY MOUTH DAILY., Disp: 90 tablet, Rfl: 1  citalopram (CELEXA) 40 MG tablet, Take 1 tablet (40 mg total) by mouth daily., Disp: 90 tablet, Rfl: 1  Review of Systems:  Constitutional: Denies fever, chills, diaphoresis, appetite change and fatigue.  HEENT: Denies photophobia, eye pain, redness, hearing loss, ear pain, congestion, sore throat, rhinorrhea, sneezing, mouth sores, trouble swallowing, neck pain, neck stiffness and tinnitus.   Respiratory: Denies SOB, DOE, cough, chest tightness,  and wheezing.   Cardiovascular: Denies chest pain, palpitations and leg swelling.  Gastrointestinal: Denies nausea, vomiting, abdominal pain, diarrhea, constipation, blood in stool and abdominal distention.  Genitourinary: Denies dysuria, urgency, frequency, hematuria, flank pain and difficulty urinating.  Endocrine: Denies: hot or cold intolerance,  sweats, changes in hair or nails, polyuria, polydipsia. Musculoskeletal: Denies myalgias, back pain, joint swelling, arthralgias and gait problem.  Skin: Denies pallor, rash and wound.  Neurological: Denies dizziness, seizures, syncope, weakness, light-headedness, numbness and headaches.  Hematological: Denies adenopathy. Easy bruising, personal or family bleeding history  Psychiatric/Behavioral: Denies suicidal ideation,  confusion, nervousness, sleep disturbance and agitation    Physical Exam: Vitals:   10/05/22 1401 10/05/22 1403  BP: (!) 150/100 (!) 163/103  Pulse: 86   Temp: 98.4 F (36.9 C)   TempSrc: Oral   SpO2: 100%   Weight: 230 lb 4.8 oz (104.5 kg)     Body mass index is 33.52 kg/m.   Constitutional: NAD, calm, comfortable Eyes: PERRL, lids and conjunctivae normal ENMT: Mucous membranes are moist.   Psychiatric: Normal judgment and insight. Alert and oriented x 3. Normal mood.    Impression and Plan:  Primary hypertension - Plan: amLODipine (NORVASC) 5 MG tablet  Anxiety - Plan: citalopram (CELEXA) 40 MG tablet  -Blood pressure remains uncontrolled as she has not been taking amlodipine, refill amlodipine and take in conjunction with Diovan HCT, return in 8 weeks for follow-up. -Refill Celexa.  Time spent:23 minutes reviewing chart, interviewing and examining patient and formulating plan of care.     Lelon Frohlich, MD [facility]

## 2022-11-09 ENCOUNTER — Other Ambulatory Visit: Payer: Self-pay | Admitting: Internal Medicine

## 2022-11-09 DIAGNOSIS — F419 Anxiety disorder, unspecified: Secondary | ICD-10-CM

## 2022-11-23 ENCOUNTER — Other Ambulatory Visit: Payer: Self-pay | Admitting: Internal Medicine

## 2022-11-23 DIAGNOSIS — E785 Hyperlipidemia, unspecified: Secondary | ICD-10-CM

## 2022-12-06 ENCOUNTER — Other Ambulatory Visit: Payer: Self-pay | Admitting: Internal Medicine

## 2022-12-06 DIAGNOSIS — A6000 Herpesviral infection of urogenital system, unspecified: Secondary | ICD-10-CM

## 2022-12-06 DIAGNOSIS — G4700 Insomnia, unspecified: Secondary | ICD-10-CM

## 2022-12-07 ENCOUNTER — Ambulatory Visit: Payer: BC Managed Care – PPO | Admitting: Internal Medicine

## 2022-12-14 ENCOUNTER — Encounter: Payer: Self-pay | Admitting: *Deleted

## 2022-12-14 ENCOUNTER — Telehealth: Payer: Self-pay | Admitting: *Deleted

## 2022-12-14 NOTE — Patient Instructions (Signed)
Visit Information  Thank you for taking time to visit with me today. Please don't hesitate to contact me if I can be of assistance to you.   Following are the goals we discussed today:   Goals Addressed             This Visit's Progress    COMPLETED: care coordination activity       Care Coordination Interventions: Reviewed medications with patient and discussed adherence with no needed refills Reviewed scheduled/upcoming provider appointments including sufficient transportation source Assessed social determinant of health barriers Educated on care management services utilizing social workers, pharmacy and a Designer, jewellery  however no needs presented today.         Please call the care guide team at [phone] if you need to cancel or reschedule your appointment.   If you are experiencing a Mental Health or [place] or need someone to talk to, please call the Suicide and Crisis Lifeline: 988  Patient verbalizes understanding of instructions and care plan provided today and agrees to view in [employer]. Active MyChart status and patient understanding of how to access instructions and care plan via MyChart confirmed with patient.     No further follow up required: No needs  Raina Mina, RN Care Management Coordinator [place] Office [phone]

## 2022-12-14 NOTE — Patient Outreach (Signed)
  Care Coordination   Initial Visit Note   12/14/2022 Name: Barbara Yates MRN: [id number] DOB: 28-Apr-1971  Barbara Yates is a 52 y.o. year old female who sees Isaac Bliss, Rayford Halsted, MD for primary care. I spoke with  Barbara Yates by phone today.  What matters to the patients health and wellness today?  No needs    Goals Addressed             This Visit's Progress    COMPLETED: care coordination activity       Care Coordination Interventions: Reviewed medications with patient and discussed adherence with no needed refills Reviewed scheduled/upcoming provider appointments including sufficient transportation source Assessed social determinant of health barriers Educated on care management services utilizing social workers, pharmacy and a Designer, jewellery  however no needs presented today.         SDOH assessments and interventions completed:  Yes  SDOH Interventions Today    Flowsheet Row Most Recent Value  SDOH Interventions   Food Insecurity Interventions Intervention Not Indicated  Housing Interventions Intervention Not Indicated  Transportation Interventions Intervention Not Indicated  Utilities Interventions Intervention Not Indicated        Care Coordination Interventions:  Yes, provided   Follow up plan: No further intervention required.   Encounter Outcome:  Pt. Visit Completed   Raina Mina, RN Care Management Coordinator [place] Office [phone]

## 2022-12-19 ENCOUNTER — Ambulatory Visit (INDEPENDENT_AMBULATORY_CARE_PROVIDER_SITE_OTHER): Payer: BC Managed Care – PPO | Admitting: Internal Medicine

## 2022-12-19 ENCOUNTER — Encounter: Payer: Self-pay | Admitting: Internal Medicine

## 2022-12-19 VITALS — BP 130/91 | HR 84 | Temp 98.4°F | Wt 226.9 lb

## 2022-12-19 DIAGNOSIS — Z6833 Body mass index (BMI) 33.0-33.9, adult: Secondary | ICD-10-CM

## 2022-12-19 DIAGNOSIS — I10 Essential (primary) hypertension: Secondary | ICD-10-CM

## 2022-12-19 DIAGNOSIS — E6609 Other obesity due to excess calories: Secondary | ICD-10-CM

## 2022-12-19 MED ORDER — SPIRONOLACTONE 25 MG PO TABS: 25.0000 mg | ORAL_TABLET | Freq: Every day | ORAL | 1 refills | Status: DC

## 2022-12-19 NOTE — Progress Notes (Signed)
Established Patient Office Visit     CC/Reason for Visit: Blood pressure follow-up  HPI: Barbara Yates is a 52 y.o. female who is coming in today for the above mentioned reasons.  At last visit amlodipine was added to Diovan HCT.  She is here today for follow-up.  In office blood pressures remain elevated.  Her home measurements also remain around 135/95.  She feels well and has no acute concerns or complaints.  She has increased her physical activity and is being more healthy about dietary choices, she has lost 5 pounds since her last visit.   Past Medical/Surgical History: Past Medical History:  Diagnosis Date   Anxiety    Chronic back pain    Depression    Diverticulitis    Hyperlipidemia    Insomnia    Migraine headache    Myofascial pain     Past Surgical History:  Procedure Laterality Date   CERVICAL SPINE SURGERY     IR RADIOLOGIST EVAL & MGMT  11/17/2020   IR RADIOLOGIST EVAL & MGMT  12/31/2020   IR RADIOLOGIST EVAL & MGMT  01/07/2021   TUBAL LIGATION      Social History:  reports that she quit smoking about 24 years ago. Her smoking use included cigarettes. She started smoking about 27 years ago. She has a 0.75 pack-year smoking history. She has never used smokeless tobacco. She reports current alcohol use of about 4.0 standard drinks of alcohol per week. She reports that she does not use drugs.  Allergies: No Known Allergies  Family History:  Family History  Problem Relation Age of Onset   Colon polyps Mother    Hypertension Paternal Grandmother    CVA Paternal Grandmother    Alzheimer's disease Paternal Grandmother    Cancer Paternal Grandfather        Pancreatic   Pancreatic cancer Maternal Grandfather    Colon cancer Neg Hx    Esophageal cancer Neg Hx    Stomach cancer Neg Hx    Rectal cancer Neg Hx      Current Outpatient Medications:    acetaminophen (TYLENOL) 500 MG tablet, Take 1,000 mg by mouth every 6 (six) hours as needed for  moderate pain., Disp: , Rfl:    ALPRAZolam (XANAX) 1 MG tablet, TAKE 1 TABLET BY MOUTH EVERY DAY AS NEEDED FOR ANXIETY, Disp: 30 tablet, Rfl: 0   amLODipine (NORVASC) 5 MG tablet, TAKE 1 TABLET (5 MG TOTAL) BY MOUTH DAILY., Disp: 90 tablet, Rfl: 1   atorvastatin (LIPITOR) 10 MG tablet, TAKE 1 TABLET BY MOUTH EVERY DAY, Disp: 90 tablet, Rfl: 1   citalopram (CELEXA) 40 MG tablet, Take 1 tablet (40 mg total) by mouth daily., Disp: 90 tablet, Rfl: 1   cyclobenzaprine (FLEXERIL) 10 MG tablet, TAKE 1 TABLET BY MOUTH AT BEDTIME AS NEEDED FOR SPASMS, Disp: 20 tablet, Rfl: 0   ibuprofen (ADVIL) 200 MG tablet, Take 3 tablets (600 mg total) by mouth every 8 (eight) hours as needed (pain)., Disp: 30 tablet, Rfl: 0   ondansetron (ZOFRAN ODT) 4 MG disintegrating tablet, Take 1 tablet (4 mg total) by mouth every 8 (eight) hours as needed for nausea or vomiting., Disp: 20 tablet, Rfl: 0   spironolactone (ALDACTONE) 25 MG tablet, Take 1 tablet (25 mg total) by mouth daily., Disp: 90 tablet, Rfl: 1   valACYclovir (VALTREX) 1000 MG tablet, TAKE 1 TABLET BY MOUTH EVERY DAY, Disp: 90 tablet, Rfl: 0   valsartan-hydrochlorothiazide (DIOVAN-HCT) 160-25 MG tablet,  Take 1 tablet by mouth daily., Disp: 90 tablet, Rfl: 1   zolpidem (AMBIEN) 10 MG tablet, TAKE 1 TABLET BY MOUTH AT BEDTIME AS NEEDED FOR SLEEP, Disp: 15 tablet, Rfl: 1  Review of Systems:  Constitutional: Denies fever, chills, diaphoresis, appetite change and fatigue.  HEENT: Denies photophobia, eye pain, redness, hearing loss, ear pain, congestion, sore throat, rhinorrhea, sneezing, mouth sores, trouble swallowing, neck pain, neck stiffness and tinnitus.   Respiratory: Denies SOB, DOE, cough, chest tightness,  and wheezing.   Cardiovascular: Denies chest pain, palpitations and leg swelling.  Gastrointestinal: Denies nausea, vomiting, abdominal pain, diarrhea, constipation, blood in stool and abdominal distention.  Genitourinary: Denies dysuria, urgency,  frequency, hematuria, flank pain and difficulty urinating.  Endocrine: Denies: hot or cold intolerance, sweats, changes in hair or nails, polyuria, polydipsia. Musculoskeletal: Denies myalgias, back pain, joint swelling, arthralgias and gait problem.  Skin: Denies pallor, rash and wound.  Neurological: Denies dizziness, seizures, syncope, weakness, light-headedness, numbness and headaches.  Hematological: Denies adenopathy. Easy bruising, personal or family bleeding history  Psychiatric/Behavioral: Denies suicidal ideation, mood changes, confusion, nervousness, sleep disturbance and agitation    Physical Exam: Vitals:   12/19/22 0901 12/19/22 0904  BP: 130/84 (!) 130/91  Pulse: 84   Temp: 98.4 F (36.9 C)   TempSrc: Oral   SpO2: 99%   Weight: 226 lb 14.4 oz (102.9 kg)     Body mass index is 33.03 kg/m.   Constitutional: NAD, calm, comfortable Eyes: PERRL, lids and conjunctivae normal, wears corrective lenses ENMT: Mucous membranes are moist.  Respiratory: clear to auscultation bilaterally, no wheezing, no crackles. Normal respiratory effort. No accessory muscle use.  Cardiovascular: Regular rate and rhythm, no murmurs / rubs / gallops. No extremity edema.   Psychiatric: Normal judgment and insight. Alert and oriented x 3. Normal mood.    Impression and Plan:  Primary hypertension - Plan: spironolactone (ALDACTONE) 25 MG tablet, Basic metabolic panel  Class 1 obesity due to excess calories with serious comorbidity and body mass index (BMI) of 33.0 to 33.9 in adult  -Blood pressure remains uncontrolled despite amlodipine 5 mg and Diovan HCT 160/25 mg. -Add spironolactone 25 mg daily. -She will return in 2 weeks for BMP to follow renal function and potassium, she will follow-up with me in 8 to 12 weeks with ambulatory blood pressure measurements. -She has been congratulated on weight loss thus far.  Time spent:32 minutes reviewing chart, interviewing and examining patient  and formulating plan of care.       Lelon Frohlich, MD [facility] at [facility]

## 2022-12-26 ENCOUNTER — Telehealth: Payer: Self-pay | Admitting: Internal Medicine

## 2022-12-26 DIAGNOSIS — Z1283 Encounter for screening for malignant neoplasm of skin: Secondary | ICD-10-CM

## 2022-12-26 NOTE — Telephone Encounter (Addendum)
Pt's daughter called to report that since Pt has started her medication, the skin on her scalp, ears and legs have been really dry and patchy. Pt's scalp is very itchy and flaky, as well.   Pt was just seen on 12/19/22.  Pt is asking for a referral to see the Dermatologist.

## 2022-12-27 NOTE — Telephone Encounter (Signed)
Referral placed

## 2023-01-02 ENCOUNTER — Other Ambulatory Visit: Payer: Self-pay | Admitting: Internal Medicine

## 2023-01-02 ENCOUNTER — Other Ambulatory Visit (INDEPENDENT_AMBULATORY_CARE_PROVIDER_SITE_OTHER): Payer: BC Managed Care – PPO

## 2023-01-02 ENCOUNTER — Encounter: Payer: Self-pay | Admitting: Internal Medicine

## 2023-01-02 DIAGNOSIS — E559 Vitamin D deficiency, unspecified: Secondary | ICD-10-CM

## 2023-01-02 DIAGNOSIS — F419 Anxiety disorder, unspecified: Secondary | ICD-10-CM

## 2023-01-02 DIAGNOSIS — I10 Essential (primary) hypertension: Secondary | ICD-10-CM

## 2023-01-02 LAB — BASIC METABOLIC PANEL
BUN: 14 mg/dL (ref 6–23)
CO2: 30 mEq/L (ref 19–32)
Calcium: 9.1 mg/dL (ref 8.4–10.5)
Chloride: 102 mEq/L (ref 96–112)
Creatinine, Ser: 1.08 mg/dL (ref 0.40–1.20)
GFR: 60.37 mL/min (ref >60.00)
Glucose, Bld: 92 mg/dL (ref 70–99)
Potassium: 3.7 mEq/L (ref 3.5–5.1)
Sodium: 140 mEq/L (ref 135–145)

## 2023-01-02 LAB — VITAMIN D 25 HYDROXY (VIT D DEFICIENCY, FRACTURES): VITD: 22.01 ng/mL — ABNORMAL LOW (ref 30.00–100.00)

## 2023-01-02 MED ORDER — VITAMIN D (ERGOCALCIFEROL) 1.25 MG (50000 UNIT) PO CAPS: 50000.0000 [IU] | ORAL_CAPSULE | ORAL | 0 refills | Status: AC

## 2023-01-26 ENCOUNTER — Other Ambulatory Visit: Payer: Self-pay | Admitting: Internal Medicine

## 2023-01-26 DIAGNOSIS — I10 Essential (primary) hypertension: Secondary | ICD-10-CM

## 2023-01-31 ENCOUNTER — Other Ambulatory Visit: Payer: Self-pay | Admitting: Internal Medicine

## 2023-02-12 ENCOUNTER — Other Ambulatory Visit: Payer: Self-pay | Admitting: Internal Medicine

## 2023-02-12 DIAGNOSIS — E559 Vitamin D deficiency, unspecified: Secondary | ICD-10-CM

## 2023-02-16 ENCOUNTER — Ambulatory Visit: Payer: BC Managed Care – PPO | Admitting: Internal Medicine

## 2023-02-23 ENCOUNTER — Other Ambulatory Visit: Payer: Self-pay | Admitting: Internal Medicine

## 2023-02-23 DIAGNOSIS — F419 Anxiety disorder, unspecified: Secondary | ICD-10-CM

## 2023-02-28 ENCOUNTER — Other Ambulatory Visit: Payer: Self-pay | Admitting: Internal Medicine

## 2023-03-07 ENCOUNTER — Other Ambulatory Visit: Payer: Self-pay | Admitting: Internal Medicine

## 2023-03-07 DIAGNOSIS — A6000 Herpesviral infection of urogenital system, unspecified: Secondary | ICD-10-CM

## 2023-03-08 ENCOUNTER — Encounter: Payer: Self-pay | Admitting: Internal Medicine

## 2023-03-08 ENCOUNTER — Ambulatory Visit (INDEPENDENT_AMBULATORY_CARE_PROVIDER_SITE_OTHER): Payer: BC Managed Care – PPO | Admitting: Internal Medicine

## 2023-03-08 VITALS — BP 112/76 | HR 88 | Temp 98.4°F | Wt 228.9 lb

## 2023-03-08 DIAGNOSIS — E559 Vitamin D deficiency, unspecified: Secondary | ICD-10-CM | POA: Diagnosis not present

## 2023-03-08 DIAGNOSIS — I10 Essential (primary) hypertension: Secondary | ICD-10-CM

## 2023-03-08 DIAGNOSIS — Z1159 Encounter for screening for other viral diseases: Secondary | ICD-10-CM

## 2023-03-08 LAB — VITAMIN D 25 HYDROXY (VIT D DEFICIENCY, FRACTURES): VITD: 44.56 ng/mL (ref 30.00–100.00)

## 2023-03-08 NOTE — Progress Notes (Signed)
Established Patient Office Visit     CC/Reason for Visit: Blood pressure follow-up  HPI: Barbara Yates is a 52 y.o. female who is coming in today for the above mentioned reasons. Past Medical History is significant for: Hypertension, vitamin D deficiency.  At last visit we added spironolactone to amlodipine and Diovan HCT for suboptimal blood pressure management.  Blood pressure has been much improved.  She has no acute concerns or complaints.   Past Medical/Surgical History: Past Medical History:  Diagnosis Date   Anxiety    Chronic back pain    Depression    Diverticulitis    Hyperlipidemia    Insomnia    Migraine headache    Myofascial pain     Past Surgical History:  Procedure Laterality Date   CERVICAL SPINE SURGERY     IR RADIOLOGIST EVAL & MGMT  11/17/2020   IR RADIOLOGIST EVAL & MGMT  12/31/2020   IR RADIOLOGIST EVAL & MGMT  01/07/2021   TUBAL LIGATION      Social History:  reports that she quit smoking about 24 years ago. Her smoking use included cigarettes. She started smoking about 27 years ago. She has a 0.75 pack-year smoking history. She has never used smokeless tobacco. She reports current alcohol use of about 4.0 standard drinks of alcohol per week. She reports that she does not use drugs.  Allergies: No Known Allergies  Family History:  Family History  Problem Relation Age of Onset   Colon polyps Mother    Hypertension Paternal Grandmother    CVA Paternal Grandmother    Alzheimer's disease Paternal Grandmother    Cancer Paternal Grandfather        Pancreatic   Pancreatic cancer Maternal Grandfather    Colon cancer Neg Hx    Esophageal cancer Neg Hx    Stomach cancer Neg Hx    Rectal cancer Neg Hx      Current Outpatient Medications:    acetaminophen (TYLENOL) 500 MG tablet, Take 1,000 mg by mouth every 6 (six) hours as needed for moderate pain., Disp: , Rfl:    ALPRAZolam (XANAX) 1 MG tablet, TAKE 1 TABLET BY MOUTH EVERY DAY AS NEEDED  FOR ANXIETY, Disp: 30 tablet, Rfl: 0   amLODipine (NORVASC) 5 MG tablet, TAKE 1 TABLET (5 MG TOTAL) BY MOUTH DAILY., Disp: 90 tablet, Rfl: 1   atorvastatin (LIPITOR) 10 MG tablet, TAKE 1 TABLET BY MOUTH EVERY DAY, Disp: 90 tablet, Rfl: 1   citalopram (CELEXA) 40 MG tablet, Take 1 tablet (40 mg total) by mouth daily., Disp: 90 tablet, Rfl: 1   cyclobenzaprine (FLEXERIL) 10 MG tablet, TAKE 1 TABLET BY MOUTH AT BEDTIME AS NEEDED FOR SPASMS, Disp: 20 tablet, Rfl: 0   ibuprofen (ADVIL) 200 MG tablet, Take 3 tablets (600 mg total) by mouth every 8 (eight) hours as needed (pain)., Disp: 30 tablet, Rfl: 0   ondansetron (ZOFRAN ODT) 4 MG disintegrating tablet, Take 1 tablet (4 mg total) by mouth every 8 (eight) hours as needed for nausea or vomiting., Disp: 20 tablet, Rfl: 0   spironolactone (ALDACTONE) 25 MG tablet, Take 1 tablet (25 mg total) by mouth daily., Disp: 90 tablet, Rfl: 1   valACYclovir (VALTREX) 1000 MG tablet, TAKE 1 TABLET BY MOUTH EVERY DAY, Disp: 90 tablet, Rfl: 0   valsartan-hydrochlorothiazide (DIOVAN-HCT) 160-25 MG tablet, TAKE 1 TABLET BY MOUTH EVERY DAY, Disp: 90 tablet, Rfl: 1   Vitamin D, Ergocalciferol, (DRISDOL) 1.25 MG (50000 UNIT) CAPS capsule, Take  1 capsule (50,000 Units total) by mouth every 7 (seven) days for 12 doses., Disp: 12 capsule, Rfl: 0   zolpidem (AMBIEN) 10 MG tablet, TAKE 1 TABLET BY MOUTH AT BEDTIME AS NEEDED FOR SLEEP, Disp: 15 tablet, Rfl: 1  Review of Systems:  Negative unless indicated in HPI.   Physical Exam: Vitals:   03/08/23 0832  BP: 112/76  Pulse: 88  Temp: 98.4 F (36.9 C)  TempSrc: Oral  SpO2: 93%  Weight: 228 lb 14.4 oz (103.8 kg)    Body mass index is 33.32 kg/m.   Physical Exam Vitals reviewed.  Constitutional:      Appearance: Normal appearance.  HENT:     Head: Normocephalic and atraumatic.  Eyes:     Conjunctiva/sclera: Conjunctivae normal.     Pupils: Pupils are equal, round, and reactive to light.  Cardiovascular:      Rate and Rhythm: Normal rate and regular rhythm.  Pulmonary:     Effort: Pulmonary effort is normal.     Breath sounds: Normal breath sounds.  Skin:    General: Skin is warm and dry.  Neurological:     General: No focal deficit present.     Mental Status: She is alert and oriented to person, place, and time.  Psychiatric:        Mood and Affect: Mood normal.        Behavior: Behavior normal.        Thought Content: Thought content normal.        Judgment: Judgment normal.      Impression and Plan:  Primary hypertension - Plan: Basic metabolic panel  Vitamin D deficiency - Plan: VITAMIN D 25 Hydroxy (Vit-D Deficiency, Fractures), VITAMIN D 25 Hydroxy (Vit-D Deficiency, Fractures)  Encounter for hepatitis C screening test for low risk patient - Plan: Hepatitis C antibody  -Blood pressure is well-controlled on amlodipine, Diovan HCT and spironolactone.  Check basic metabolic panel today to follow renal function and electrolytes. -Check hepatitis C antibody for screening as well as vitamin D levels that she has now completed vitamin D supplementation.  Time spent:32 minutes reviewing chart, interviewing and examining patient and formulating plan of care.     Lelon Frohlich, MD [facility]

## 2023-03-21 ENCOUNTER — Other Ambulatory Visit: Payer: Self-pay | Admitting: Internal Medicine

## 2023-03-21 DIAGNOSIS — G4700 Insomnia, unspecified: Secondary | ICD-10-CM

## 2023-04-01 ENCOUNTER — Other Ambulatory Visit: Payer: Self-pay | Admitting: Internal Medicine

## 2023-04-01 DIAGNOSIS — E785 Hyperlipidemia, unspecified: Secondary | ICD-10-CM

## 2023-04-01 DIAGNOSIS — I10 Essential (primary) hypertension: Secondary | ICD-10-CM

## 2023-05-02 ENCOUNTER — Other Ambulatory Visit: Payer: Self-pay | Admitting: Internal Medicine

## 2023-05-04 ENCOUNTER — Other Ambulatory Visit: Payer: Self-pay | Admitting: Adult Health

## 2023-05-04 DIAGNOSIS — F419 Anxiety disorder, unspecified: Secondary | ICD-10-CM

## 2023-05-05 NOTE — Telephone Encounter (Signed)
Pt LOV was on 03/08/23 Last refill for Xanax was 02/28/23 for 30 tablets Please advise

## 2023-06-05 ENCOUNTER — Other Ambulatory Visit: Payer: Self-pay | Admitting: Internal Medicine

## 2023-06-05 DIAGNOSIS — K5732 Diverticulitis of large intestine without perforation or abscess without bleeding: Secondary | ICD-10-CM

## 2023-06-20 ENCOUNTER — Other Ambulatory Visit: Payer: Self-pay | Admitting: Internal Medicine

## 2023-06-20 DIAGNOSIS — I10 Essential (primary) hypertension: Secondary | ICD-10-CM

## 2023-06-28 DIAGNOSIS — D225 Melanocytic nevi of trunk: Secondary | ICD-10-CM | POA: Diagnosis not present

## 2023-06-28 DIAGNOSIS — L821 Other seborrheic keratosis: Secondary | ICD-10-CM | POA: Diagnosis not present

## 2023-06-28 DIAGNOSIS — L218 Other seborrheic dermatitis: Secondary | ICD-10-CM | POA: Diagnosis not present

## 2023-06-28 DIAGNOSIS — L438 Other lichen planus: Secondary | ICD-10-CM | POA: Diagnosis not present

## 2023-06-30 ENCOUNTER — Other Ambulatory Visit: Payer: Self-pay | Admitting: Internal Medicine

## 2023-06-30 DIAGNOSIS — I10 Essential (primary) hypertension: Secondary | ICD-10-CM

## 2023-07-05 ENCOUNTER — Other Ambulatory Visit: Payer: Self-pay | Admitting: Internal Medicine

## 2023-07-27 ENCOUNTER — Other Ambulatory Visit: Payer: Self-pay | Admitting: Internal Medicine

## 2023-07-27 DIAGNOSIS — F419 Anxiety disorder, unspecified: Secondary | ICD-10-CM

## 2023-07-27 DIAGNOSIS — G4700 Insomnia, unspecified: Secondary | ICD-10-CM

## 2023-09-05 ENCOUNTER — Other Ambulatory Visit: Payer: Self-pay | Admitting: Internal Medicine

## 2023-09-08 ENCOUNTER — Other Ambulatory Visit: Payer: Self-pay | Admitting: Internal Medicine

## 2023-09-08 DIAGNOSIS — F419 Anxiety disorder, unspecified: Secondary | ICD-10-CM

## 2023-09-09 ENCOUNTER — Other Ambulatory Visit: Payer: Self-pay | Admitting: Internal Medicine

## 2023-09-09 DIAGNOSIS — E785 Hyperlipidemia, unspecified: Secondary | ICD-10-CM

## 2023-09-28 ENCOUNTER — Other Ambulatory Visit: Payer: Self-pay | Admitting: Internal Medicine

## 2023-09-28 DIAGNOSIS — I10 Essential (primary) hypertension: Secondary | ICD-10-CM

## 2023-10-12 ENCOUNTER — Other Ambulatory Visit: Payer: Self-pay | Admitting: Internal Medicine

## 2023-10-12 DIAGNOSIS — F419 Anxiety disorder, unspecified: Secondary | ICD-10-CM

## 2023-10-22 ENCOUNTER — Other Ambulatory Visit: Payer: Self-pay | Admitting: Family Medicine

## 2023-10-22 ENCOUNTER — Other Ambulatory Visit: Payer: Self-pay | Admitting: Internal Medicine

## 2023-10-22 DIAGNOSIS — G4700 Insomnia, unspecified: Secondary | ICD-10-CM

## 2023-12-12 ENCOUNTER — Other Ambulatory Visit: Payer: Self-pay | Admitting: Internal Medicine

## 2023-12-12 DIAGNOSIS — F419 Anxiety disorder, unspecified: Secondary | ICD-10-CM

## 2023-12-12 DIAGNOSIS — E785 Hyperlipidemia, unspecified: Secondary | ICD-10-CM

## 2023-12-31 ENCOUNTER — Other Ambulatory Visit: Payer: Self-pay | Admitting: Internal Medicine

## 2023-12-31 DIAGNOSIS — G4700 Insomnia, unspecified: Secondary | ICD-10-CM

## 2024-01-02 ENCOUNTER — Other Ambulatory Visit: Payer: Self-pay | Admitting: Internal Medicine

## 2024-01-02 DIAGNOSIS — I10 Essential (primary) hypertension: Secondary | ICD-10-CM

## 2024-01-10 ENCOUNTER — Other Ambulatory Visit: Payer: Self-pay | Admitting: Internal Medicine

## 2024-02-27 ENCOUNTER — Other Ambulatory Visit: Payer: Self-pay | Admitting: Internal Medicine

## 2024-02-27 DIAGNOSIS — F419 Anxiety disorder, unspecified: Secondary | ICD-10-CM

## 2024-02-28 ENCOUNTER — Other Ambulatory Visit: Payer: Self-pay | Admitting: Internal Medicine

## 2024-02-28 DIAGNOSIS — I10 Essential (primary) hypertension: Secondary | ICD-10-CM

## 2024-03-10 ENCOUNTER — Other Ambulatory Visit: Payer: Self-pay | Admitting: Internal Medicine

## 2024-03-10 DIAGNOSIS — E785 Hyperlipidemia, unspecified: Secondary | ICD-10-CM

## 2024-03-10 DIAGNOSIS — F419 Anxiety disorder, unspecified: Secondary | ICD-10-CM

## 2024-03-31 ENCOUNTER — Other Ambulatory Visit: Payer: Self-pay | Admitting: Internal Medicine

## 2024-03-31 DIAGNOSIS — G4700 Insomnia, unspecified: Secondary | ICD-10-CM

## 2024-05-13 ENCOUNTER — Other Ambulatory Visit: Payer: Self-pay | Admitting: Internal Medicine

## 2024-05-13 DIAGNOSIS — F419 Anxiety disorder, unspecified: Secondary | ICD-10-CM

## 2024-05-28 ENCOUNTER — Other Ambulatory Visit: Payer: Self-pay | Admitting: Internal Medicine

## 2024-05-28 DIAGNOSIS — A6000 Herpesviral infection of urogenital system, unspecified: Secondary | ICD-10-CM

## 2024-06-04 NOTE — Progress Notes (Signed)
 [facility] [place] Quality Team Note  Name: Barbara Yates Date of Birth: [birth year] MRN: [id number] Date: 06/04/2024  [facility] Quality Team has reviewed this patient's chart, please see recommendations below:  [facility] Quality Other; (CHART REVIEWED. PATIENT NEEDS BLOOD PRESSURE READING LESS THAN 140/90 FOR GAP CLOSURE.)

## 2024-06-11 ENCOUNTER — Other Ambulatory Visit: Payer: Self-pay | Admitting: Internal Medicine

## 2024-06-11 DIAGNOSIS — G4700 Insomnia, unspecified: Secondary | ICD-10-CM

## 2024-06-17 ENCOUNTER — Other Ambulatory Visit: Payer: Self-pay | Admitting: Internal Medicine

## 2024-06-17 DIAGNOSIS — F419 Anxiety disorder, unspecified: Secondary | ICD-10-CM

## 2024-06-17 DIAGNOSIS — E785 Hyperlipidemia, unspecified: Secondary | ICD-10-CM

## 2024-06-30 ENCOUNTER — Other Ambulatory Visit: Payer: Self-pay | Admitting: Internal Medicine

## 2024-06-30 DIAGNOSIS — I10 Essential (primary) hypertension: Secondary | ICD-10-CM

## 2024-07-21 ENCOUNTER — Other Ambulatory Visit: Payer: Self-pay | Admitting: Adult Health

## 2024-07-21 DIAGNOSIS — F419 Anxiety disorder, unspecified: Secondary | ICD-10-CM

## 2024-07-24 NOTE — Telephone Encounter (Signed)
Message routed to PCP CMA  

## 2024-07-24 NOTE — Telephone Encounter (Signed)
 Refill sent.

## 2024-08-08 ENCOUNTER — Encounter: Payer: Self-pay | Admitting: Gastroenterology

## 2024-08-17 ENCOUNTER — Other Ambulatory Visit: Payer: Self-pay | Admitting: Internal Medicine

## 2024-08-17 DIAGNOSIS — G4700 Insomnia, unspecified: Secondary | ICD-10-CM

## 2024-08-23 ENCOUNTER — Telehealth: Payer: Self-pay | Admitting: *Deleted

## 2024-08-23 NOTE — Telephone Encounter (Signed)
 Copied from CRM [id number]. Topic: Clinical - Medication Question >> Aug 23, 2024 10:39 AM Carlyon D wrote: Reason for CRM: pt is in [place] for a vacation pt states she left all her medication at home pt is asking of pcp can send a few days worth to [facility] pharmacy in [place] where pt is on vacation. Pt would like today to Monday for all blood pressure meds, cholesterol meds, citalopram  (CELEXA ) 40 MG tablet Pt is asking if she can get one or 2 of the Xanex and 1 or 2 of the zolpidem  (AMBIEN ) 10 MG tablet.  Please reach out to pt in regards any further info or questions or update on this please.   [facility]  [address] [place]   [address]  # [phone]

## 2024-08-26 NOTE — Telephone Encounter (Signed)
 Left detailed message on machine for patient with Dr Dennard response.

## 2024-09-17 ENCOUNTER — Other Ambulatory Visit: Payer: Self-pay | Admitting: Internal Medicine

## 2024-09-17 DIAGNOSIS — F419 Anxiety disorder, unspecified: Secondary | ICD-10-CM

## 2024-09-17 DIAGNOSIS — I10 Essential (primary) hypertension: Secondary | ICD-10-CM

## 2024-09-17 DIAGNOSIS — E785 Hyperlipidemia, unspecified: Secondary | ICD-10-CM

## 2024-09-19 ENCOUNTER — Ambulatory Visit: Admitting: Internal Medicine

## 2024-09-23 ENCOUNTER — Ambulatory Visit (INDEPENDENT_AMBULATORY_CARE_PROVIDER_SITE_OTHER): Admitting: Internal Medicine

## 2024-09-23 ENCOUNTER — Encounter: Payer: Self-pay | Admitting: Internal Medicine

## 2024-09-23 VITALS — BP 110/84 | HR 85 | Temp 98.3°F | Wt 227.4 lb

## 2024-09-23 DIAGNOSIS — M797 Fibromyalgia: Secondary | ICD-10-CM | POA: Diagnosis not present

## 2024-09-23 DIAGNOSIS — Z1211 Encounter for screening for malignant neoplasm of colon: Secondary | ICD-10-CM

## 2024-09-23 DIAGNOSIS — K5732 Diverticulitis of large intestine without perforation or abscess without bleeding: Secondary | ICD-10-CM | POA: Diagnosis not present

## 2024-09-23 DIAGNOSIS — I10 Essential (primary) hypertension: Secondary | ICD-10-CM

## 2024-09-23 DIAGNOSIS — E559 Vitamin D deficiency, unspecified: Secondary | ICD-10-CM

## 2024-09-23 DIAGNOSIS — E785 Hyperlipidemia, unspecified: Secondary | ICD-10-CM

## 2024-09-23 DIAGNOSIS — Z1159 Encounter for screening for other viral diseases: Secondary | ICD-10-CM

## 2024-09-23 DIAGNOSIS — G4700 Insomnia, unspecified: Secondary | ICD-10-CM

## 2024-09-23 LAB — LIPID PANEL
Cholesterol: 210 mg/dL — ABNORMAL HIGH (ref 0–200)
HDL: 57.7 mg/dL (ref >39.00)
LDL Cholesterol: 130 mg/dL — ABNORMAL HIGH (ref 0–99)
NonHDL: 152.41
Total CHOL/HDL Ratio: 4
Triglycerides: 111 mg/dL (ref 0.0–149.0)
VLDL: 22.2 mg/dL (ref 0.0–40.0)

## 2024-09-23 LAB — COMPREHENSIVE METABOLIC PANEL WITH GFR
ALT: 11 U/L (ref 0–35)
AST: 13 U/L (ref 0–37)
Albumin: 4.3 g/dL (ref 3.5–5.2)
Alkaline Phosphatase: 85 U/L (ref 39–117)
BUN: 9 mg/dL (ref 6–23)
CO2: 27 meq/L (ref 19–32)
Calcium: 8.8 mg/dL (ref 8.4–10.5)
Chloride: 104 meq/L (ref 96–112)
Creatinine, Ser: 0.93 mg/dL (ref 0.40–1.20)
GFR: 71.36 mL/min (ref >60.00)
Glucose, Bld: 86 mg/dL (ref 70–99)
Potassium: 4.1 meq/L (ref 3.5–5.1)
Sodium: 140 meq/L (ref 135–145)
Total Bilirubin: 0.5 mg/dL (ref 0.2–1.2)
Total Protein: 6.6 g/dL (ref 6.0–8.3)

## 2024-09-23 LAB — CBC WITH DIFFERENTIAL/PLATELET
Basophils Absolute: 0 K/uL (ref 0.0–0.1)
Basophils Relative: 0.6 % (ref 0.0–3.0)
Eosinophils Absolute: 0.1 K/uL (ref 0.0–0.7)
Eosinophils Relative: 2.2 % (ref 0.0–5.0)
HCT: 39.5 % (ref 36.0–46.0)
Hemoglobin: 13.1 g/dL (ref 12.0–15.0)
Lymphocytes Relative: 17.9 % (ref 12.0–46.0)
Lymphs Abs: 1 K/uL (ref 0.7–4.0)
MCHC: 33.1 g/dL (ref 30.0–36.0)
MCV: 91.5 fl (ref 78.0–100.0)
Monocytes Absolute: 0.3 K/uL (ref 0.1–1.0)
Monocytes Relative: 5.8 % (ref 3.0–12.0)
Neutro Abs: 4 K/uL (ref 1.4–7.7)
Neutrophils Relative %: 73.5 % (ref 43.0–77.0)
Platelets: 283 K/uL (ref 150.0–400.0)
RBC: 4.31 Mil/uL (ref 3.87–5.11)
RDW: 14 % (ref 11.5–15.5)
WBC: 5.4 K/uL (ref 4.0–10.5)

## 2024-09-23 LAB — VITAMIN B12: Vitamin B-12: 206 pg/mL — ABNORMAL LOW (ref 211–911)

## 2024-09-23 LAB — VITAMIN D 25 HYDROXY (VIT D DEFICIENCY, FRACTURES): VITD: 18.52 ng/mL — ABNORMAL LOW (ref 30.00–100.00)

## 2024-09-23 MED ORDER — ONDANSETRON 4 MG PO TBDP: 4.0000 mg | ORAL_TABLET | Freq: Three times a day (TID) | ORAL | 0 refills | Status: AC | PRN

## 2024-09-23 NOTE — Progress Notes (Signed)
 Established Patient Office Visit     CC/Reason for Visit: Follow-up chronic conditions  HPI: Barbara Yates is a 53 y.o. female who is coming in today for the above mentioned reasons. Past Medical History is significant for: Hypertension, vitamin D  deficiency, insomnia, generalized anxiety disorder.  Feeling well.  No concerns or complaints.  Is overdue for colonoscopy.  Is due for flu, COVID, shingles vaccines.  Follows with GYN routinely.   Past Medical/Surgical History: Past Medical History:  Diagnosis Date   Anxiety    Chronic back pain    Depression    Diverticulitis    Hyperlipidemia    Insomnia    Migraine headache    Myofascial pain     Past Surgical History:  Procedure Laterality Date   CERVICAL SPINE SURGERY     IR RADIOLOGIST EVAL & MGMT  11/17/2020   IR RADIOLOGIST EVAL & MGMT  12/31/2020   IR RADIOLOGIST EVAL & MGMT  01/07/2021   TUBAL LIGATION      Social History:  reports that she quit smoking about 26 years ago. Her smoking use included cigarettes. She started smoking about 29 years ago. She has a 0.8 pack-year smoking history. She has never used smokeless tobacco. She reports current alcohol use of about 4.0 standard drinks of alcohol per week. She reports that she does not use drugs.  Allergies: No Known Allergies  Family History:  Family History  Problem Relation Age of Onset   Colon polyps Mother    Hypertension Paternal Grandmother    CVA Paternal Grandmother    Alzheimer's disease Paternal Grandmother    Cancer Paternal Grandfather        Pancreatic   Pancreatic cancer Maternal Grandfather    Colon cancer Neg Hx    Esophageal cancer Neg Hx    Stomach cancer Neg Hx    Rectal cancer Neg Hx      Current Outpatient Medications:    acetaminophen  (TYLENOL ) 500 MG tablet, Take 1,000 mg by mouth every 6 (six) hours as needed for moderate pain., Disp: , Rfl:    ALPRAZolam  (XANAX ) 1 MG tablet, TAKE 1 TABLET BY MOUTH EVERY DAY AS NEEDED FOR  ANXIETY, Disp: 30 tablet, Rfl: 0   amLODipine  (NORVASC ) 5 MG tablet, TAKE 1 TABLET (5 MG TOTAL) BY MOUTH DAILY., Disp: 90 tablet, Rfl: 0   atorvastatin  (LIPITOR) 10 MG tablet, TAKE 1 TABLET BY MOUTH EVERY DAY, Disp: 90 tablet, Rfl: 0   citalopram  (CELEXA ) 40 MG tablet, TAKE 1 TABLET BY MOUTH EVERY DAY, Disp: 90 tablet, Rfl: 0   cyclobenzaprine  (FLEXERIL ) 10 MG tablet, TAKE 1 TABLET BY MOUTH AT BEDTIME AS NEEDED FOR SPASMS, Disp: 20 tablet, Rfl: 0   ibuprofen  (ADVIL ) 200 MG tablet, Take 3 tablets (600 mg total) by mouth every 8 (eight) hours as needed (pain)., Disp: 30 tablet, Rfl: 0   spironolactone  (ALDACTONE ) 25 MG tablet, TAKE 1 TABLET (25 MG TOTAL) BY MOUTH DAILY., Disp: 90 tablet, Rfl: 0   valACYclovir  (VALTREX ) 1000 MG tablet, TAKE 1 TABLET BY MOUTH EVERY DAY, Disp: 30 tablet, Rfl: 0   valsartan -hydrochlorothiazide  (DIOVAN -HCT) 160-25 MG tablet, TAKE 1 TABLET BY MOUTH EVERY DAY, Disp: 90 tablet, Rfl: 0   zolpidem  (AMBIEN ) 10 MG tablet, TAKE 1 TABLET BY MOUTH EVERY DAY AT BEDTIME AS NEEDED FOR SLEEP, Disp: 15 tablet, Rfl: 1   ondansetron  (ZOFRAN  ODT) 4 MG disintegrating tablet, Take 1 tablet (4 mg total) by mouth every 8 (eight) hours as needed for  nausea or vomiting., Disp: 20 tablet, Rfl: 0  Review of Systems:  Negative unless indicated in HPI.   Physical Exam: Vitals:   09/23/24 0837  BP: 110/84  Pulse: 85  Temp: 98.3 F (36.8 C)  TempSrc: Oral  SpO2: 100%  Weight: 227 lb 6.4 oz (103.1 kg)    Body mass index is 33.1 kg/m.   Physical Exam Vitals reviewed.  Constitutional:      Appearance: Normal appearance.  HENT:     Head: Normocephalic and atraumatic.  Eyes:     Conjunctiva/sclera: Conjunctivae normal.  Cardiovascular:     Rate and Rhythm: Normal rate and regular rhythm.  Pulmonary:     Effort: Pulmonary effort is normal.     Breath sounds: Normal breath sounds.  Skin:    General: Skin is warm and dry.  Neurological:     General: No focal deficit present.      Mental Status: She is alert and oriented to person, place, and time.  Psychiatric:        Mood and Affect: Mood normal.        Behavior: Behavior normal.        Thought Content: Thought content normal.        Judgment: Judgment normal.      Impression and Plan:  Fibromyalgia -     Vitamin B12; Future  Diverticulitis of colon -     Ondansetron ; Take 1 tablet (4 mg total) by mouth every 8 (eight) hours as needed for nausea or vomiting.  Dispense: 20 tablet; Refill: 0  Hyperlipidemia, unspecified hyperlipidemia type -     Lipid panel; Future  Insomnia, unspecified type  Primary hypertension -     CBC with Differential/Platelet; Future -     Comprehensive metabolic panel with GFR; Future  Encounter for hepatitis C screening test for low risk patient -     Hepatitis C antibody; Future  Vitamin D  deficiency -     VITAMIN D  25 Hydroxy (Vit-D Deficiency, Fractures); Future  Screening for colon cancer -     Ambulatory referral to Gastroenterology   - Refer to GI as she is overdue for her 3-year callback colonoscopy. - Blood pressure is well-controlled on current medication. - Check lipids, check vitamin D .  Time spent:30 minutes reviewing chart, interviewing and examining patient and formulating plan of care.     Tully Theophilus Andrews, MD [facility] at [facility]

## 2024-09-24 ENCOUNTER — Ambulatory Visit: Payer: Self-pay | Admitting: Internal Medicine

## 2024-09-24 DIAGNOSIS — E538 Deficiency of other specified B group vitamins: Secondary | ICD-10-CM

## 2024-09-24 DIAGNOSIS — E559 Vitamin D deficiency, unspecified: Secondary | ICD-10-CM

## 2024-09-24 DIAGNOSIS — E782 Mixed hyperlipidemia: Secondary | ICD-10-CM

## 2024-09-24 LAB — HEPATITIS C ANTIBODY: Hepatitis C Ab: NONREACTIVE

## 2024-09-24 MED ORDER — ATORVASTATIN CALCIUM 20 MG PO TABS: 20.0000 mg | ORAL_TABLET | Freq: Every day | ORAL | 1 refills | Status: AC

## 2024-09-24 MED ORDER — VITAMIN D (ERGOCALCIFEROL) 1.25 MG (50000 UNIT) PO CAPS: 50000.0000 [IU] | ORAL_CAPSULE | ORAL | 0 refills | Status: AC

## 2024-09-25 ENCOUNTER — Other Ambulatory Visit: Payer: Self-pay | Admitting: Internal Medicine

## 2024-09-25 DIAGNOSIS — F419 Anxiety disorder, unspecified: Secondary | ICD-10-CM

## 2024-09-27 ENCOUNTER — Ambulatory Visit

## 2024-09-27 DIAGNOSIS — E538 Deficiency of other specified B group vitamins: Secondary | ICD-10-CM | POA: Diagnosis not present

## 2024-09-27 MED ORDER — CYANOCOBALAMIN 1000 MCG/ML IJ SOLN
1000.0000 ug | INTRAMUSCULAR | Status: AC
  Administered 2024-09-27: 1000 ug via INTRAMUSCULAR

## 2024-09-27 NOTE — Progress Notes (Signed)
 Patient is in office today for a nurse visit for B12 Injection. Patient Injection was given in the  Right deltoid. Patient tolerated injection well.

## 2024-10-04 ENCOUNTER — Ambulatory Visit (INDEPENDENT_AMBULATORY_CARE_PROVIDER_SITE_OTHER): Admitting: *Deleted

## 2024-10-04 DIAGNOSIS — E538 Deficiency of other specified B group vitamins: Secondary | ICD-10-CM

## 2024-10-04 MED ORDER — CYANOCOBALAMIN 1000 MCG/ML IJ SOLN
1000.0000 ug | Freq: Once | INTRAMUSCULAR | Status: AC
  Administered 2024-10-04: 1000 ug via INTRAMUSCULAR

## 2024-10-04 NOTE — Progress Notes (Signed)
Per orders of Dr. Burchette, injection of Cyanocobalamin 1000mcg given by Towanda Hornstein A. Patient tolerated injection well.  

## 2024-10-11 ENCOUNTER — Ambulatory Visit

## 2024-10-11 DIAGNOSIS — E538 Deficiency of other specified B group vitamins: Secondary | ICD-10-CM

## 2024-10-11 MED ORDER — CYANOCOBALAMIN 1000 MCG/ML IJ SOLN
1000.0000 ug | Freq: Once | INTRAMUSCULAR | Status: AC
  Administered 2024-10-11: 1000 ug via INTRAMUSCULAR

## 2024-10-11 NOTE — Progress Notes (Signed)
 Per orders of Dr. Caryl Never, injection of Cyanocobalamin 1000 mcg given by Trudie Cervantes L Pesach Frisch. Patient tolerated injection well.

## 2024-10-18 ENCOUNTER — Ambulatory Visit (INDEPENDENT_AMBULATORY_CARE_PROVIDER_SITE_OTHER): Admitting: *Deleted

## 2024-10-18 DIAGNOSIS — E538 Deficiency of other specified B group vitamins: Secondary | ICD-10-CM

## 2024-10-18 MED ORDER — CYANOCOBALAMIN 1000 MCG/ML IJ SOLN
1000.0000 ug | Freq: Once | INTRAMUSCULAR | Status: AC
  Administered 2024-10-18: 1000 ug via INTRAMUSCULAR

## 2024-10-18 NOTE — Progress Notes (Signed)
 Per orders of Dr. Casimiro Needle, injection of Cyanocobalamin given by Johnella Moloney. Patient tolerated injection well.

## 2024-11-05 ENCOUNTER — Other Ambulatory Visit: Payer: Self-pay | Admitting: Internal Medicine

## 2024-11-05 DIAGNOSIS — I10 Essential (primary) hypertension: Secondary | ICD-10-CM

## 2024-11-18 ENCOUNTER — Ambulatory Visit

## 2024-11-18 DIAGNOSIS — E538 Deficiency of other specified B group vitamins: Secondary | ICD-10-CM | POA: Diagnosis not present

## 2024-11-18 MED ORDER — CYANOCOBALAMIN 1000 MCG/ML IJ SOLN
1000.0000 ug | Freq: Once | INTRAMUSCULAR | Status: AC
  Administered 2024-11-18: 09:00:00 1000 ug via INTRAMUSCULAR

## 2024-11-18 NOTE — Progress Notes (Signed)
 Patient was given Vitamin B12 injection. Patient tolerated the injection well.

## 2024-11-20 ENCOUNTER — Other Ambulatory Visit: Payer: Self-pay | Admitting: Internal Medicine

## 2024-11-20 DIAGNOSIS — G4700 Insomnia, unspecified: Secondary | ICD-10-CM

## 2024-11-20 DIAGNOSIS — F419 Anxiety disorder, unspecified: Secondary | ICD-10-CM

## 2024-12-20 ENCOUNTER — Ambulatory Visit: Admitting: *Deleted

## 2024-12-20 DIAGNOSIS — E538 Deficiency of other specified B group vitamins: Secondary | ICD-10-CM | POA: Diagnosis not present

## 2024-12-20 MED ORDER — CYANOCOBALAMIN 1000 MCG/ML IJ SOLN
1000.0000 ug | Freq: Once | INTRAMUSCULAR | Status: AC
  Administered 2024-12-20: 1000 ug via INTRAMUSCULAR

## 2024-12-20 NOTE — Progress Notes (Signed)
 Per orders of Dr. Casimiro Needle, injection of Cyanocobalamin given by Johnella Moloney. Patient tolerated injection well.

## 2024-12-22 ENCOUNTER — Other Ambulatory Visit: Payer: Self-pay | Admitting: Internal Medicine

## 2024-12-22 DIAGNOSIS — I10 Essential (primary) hypertension: Secondary | ICD-10-CM

## 2024-12-27 ENCOUNTER — Telehealth: Payer: Self-pay

## 2024-12-27 DIAGNOSIS — F419 Anxiety disorder, unspecified: Secondary | ICD-10-CM

## 2024-12-27 MED ORDER — CITALOPRAM HYDROBROMIDE 40 MG PO TABS: 40.0000 mg | ORAL_TABLET | Freq: Every day | ORAL | 0 refills | Status: AC

## 2024-12-27 NOTE — Telephone Encounter (Signed)
 Rx done

## 2024-12-27 NOTE — Addendum Note (Signed)
 Addended by: CHRISTYNE IDELL DRAGON A on: 12/27/2024 09:38 AM   Modules accepted: Orders

## 2024-12-27 NOTE — Telephone Encounter (Signed)
 Copied from CRM #[id number]. Topic: Clinical - Medication Refill >> Dec 27, 2024  8:34 AM Laymon HERO wrote: Medication: citalopram  (CELEXA ) 40 MG tablet  Has the patient contacted their pharmacy? Yes (Agent: If no, request that the patient contact the pharmacy for the refill. If patient does not wish to contact the pharmacy document the reason why and proceed with request.) (Agent: If yes, when and what did the pharmacy advise?)  This is the patient's preferred pharmacy:  [id number] [place] [place] - [address] AT [facility] [address]  [place] [address] Phone: [phone] Fax: [phone]  Is this the correct pharmacy for this prescription? Yes If no, delete pharmacy and type the correct one.   Has the prescription been filled recently? Yes  Is the patient out of the medication? Yes  Has the patient been seen for an appointment in the last year OR does the patient have an upcoming appointment? Yes  Can we respond through MyChart? Yes  Agent: Please be advised that Rx refills may take up to 3 business days. We ask that you follow-up with your pharmacy.

## 2024-12-30 ENCOUNTER — Other Ambulatory Visit

## 2025-01-03 ENCOUNTER — Other Ambulatory Visit

## 2025-01-04 ENCOUNTER — Other Ambulatory Visit: Payer: Self-pay | Admitting: Internal Medicine

## 2025-01-04 DIAGNOSIS — F419 Anxiety disorder, unspecified: Secondary | ICD-10-CM

## 2025-01-13 ENCOUNTER — Other Ambulatory Visit

## 2025-01-20 ENCOUNTER — Ambulatory Visit
